# Patient Record
Sex: Male | Born: 1940
Health system: Southern US, Community
[De-identification: ages and names within clinical notes are randomized; demographics above are authoritative.]

## PROBLEM LIST (undated history)

## (undated) DIAGNOSIS — E785 Hyperlipidemia, unspecified: Secondary | ICD-10-CM

## (undated) DIAGNOSIS — I480 Paroxysmal atrial fibrillation: Secondary | ICD-10-CM

## (undated) DIAGNOSIS — I10 Essential (primary) hypertension: Secondary | ICD-10-CM

## (undated) DIAGNOSIS — M109 Gout, unspecified: Secondary | ICD-10-CM

## (undated) HISTORY — DX: Essential (primary) hypertension: I10

## (undated) HISTORY — DX: Hyperlipidemia, unspecified: E78.5

## (undated) HISTORY — PX: OTHER SURGICAL HISTORY: SHX169

---

## 2011-05-16 ENCOUNTER — Ambulatory Visit (INDEPENDENT_AMBULATORY_CARE_PROVIDER_SITE_OTHER): Payer: BC Managed Care – PPO | Admitting: Family Medicine

## 2011-05-16 VITALS — BP 156/74 | HR 60 | Temp 98.3°F | Resp 16 | Ht 61.5 in | Wt 132.6 lb

## 2011-05-16 DIAGNOSIS — N289 Disorder of kidney and ureter, unspecified: Secondary | ICD-10-CM

## 2011-05-16 DIAGNOSIS — IMO0001 Reserved for inherently not codable concepts without codable children: Secondary | ICD-10-CM

## 2011-05-16 DIAGNOSIS — I10 Essential (primary) hypertension: Secondary | ICD-10-CM

## 2011-05-16 LAB — POCT CBC
Granulocyte percent: 60 %G (ref 37–80)
MID (cbc): 0.5 (ref 0–0.9)
MPV: 9 fL (ref 0–99.8)
POC MID %: 7.3 %M (ref 0–12)
Platelet Count, POC: 220 10*3/uL (ref 142–424)
RBC: 5.12 M/uL (ref 4.69–6.13)

## 2011-05-16 LAB — GLUCOSE, POCT (MANUAL RESULT ENTRY): POC Glucose: 75

## 2011-05-16 MED ORDER — ACETAMINOPHEN-CODEINE #3 300-30 MG PO TABS: 1.0000 | ORAL_TABLET | Freq: Two times a day (BID) | ORAL | Status: DC | PRN

## 2011-05-16 MED ORDER — PREDNISONE 20 MG PO TABS: ORAL_TABLET | ORAL | Status: DC

## 2011-05-16 NOTE — Progress Notes (Signed)
  Subjective:    Patient ID: Reginald Long, male    DOB: 01/13/1941, 71 y.o.   MRN: [id number]  Foot Pain This is a new problem. The current episode started yesterday. The problem occurs constantly. The problem has been gradually worsening.  Pain most severe with ambulation  No trauma H/O gout; took Colcrys .6mg  X2 last night and then took 2 additional tablets this morning Minimal relief of symptoms  2) Elevated BP- states never been diagnosed with hypertension in the past. Denies H/A, CP, SOB or focal deficits   Review of Systems     Objective:   Physical Exam  Constitutional: He appears well-developed.  Neck: Neck supple.  Cardiovascular: Normal rate and regular rhythm.   Pulmonary/Chest: Effort normal and breath sounds normal.  Musculoskeletal: He exhibits tenderness (base of the (R) achilles tendon).  Neurological: He is alert.           Assessment & Plan:   1. Elevated BP  POCT glucose (manual entry), Comprehensive metabolic panel  2. Gout  POCT CBC, Uric acid, predniSONE (DELTASONE) 20 MG tablet, acetaminophen-codeine (TYLENOL #3) 300-30 MG per tablet   Monitor BP.  If readings X 3 remain elevated RTC to initiate medication therapy.  Discussed low salt diet and regular exercise.  Interpreter present through entire OV.

## 2011-05-17 LAB — COMPREHENSIVE METABOLIC PANEL
ALT: 54 U/L — ABNORMAL HIGH (ref 0–53)
AST: 60 U/L — ABNORMAL HIGH (ref 0–37)
Alkaline Phosphatase: 89 U/L (ref 39–117)
Sodium: 141 mEq/L (ref 135–145)
Total Bilirubin: 0.5 mg/dL (ref 0.3–1.2)
Total Protein: 7.4 g/dL (ref 6.0–8.3)

## 2011-05-17 LAB — URIC ACID: Uric Acid, Serum: 9.4 mg/dL — ABNORMAL HIGH (ref 4.0–7.8)

## 2011-05-20 ENCOUNTER — Encounter: Payer: Self-pay | Admitting: Family Medicine

## 2011-05-26 ENCOUNTER — Ambulatory Visit (INDEPENDENT_AMBULATORY_CARE_PROVIDER_SITE_OTHER): Payer: BC Managed Care – PPO | Admitting: Family Medicine

## 2011-05-26 VITALS — BP 186/89 | HR 48 | Temp 97.4°F | Resp 16 | Ht 61.25 in | Wt 133.4 lb

## 2011-05-26 DIAGNOSIS — I10 Essential (primary) hypertension: Secondary | ICD-10-CM

## 2011-05-26 DIAGNOSIS — R066 Hiccough: Secondary | ICD-10-CM

## 2011-05-26 MED ORDER — CHLORPROMAZINE HCL 25 MG PO TABS: 25.0000 mg | ORAL_TABLET | Freq: Every day | ORAL | Status: DC

## 2011-05-26 MED ORDER — RANITIDINE HCL 300 MG PO TABS: 300.0000 mg | ORAL_TABLET | Freq: Every day | ORAL | Status: DC

## 2011-05-26 MED ORDER — AMLODIPINE BESYLATE 5 MG PO TABS: 5.0000 mg | ORAL_TABLET | Freq: Every day | ORAL | Status: DC

## 2011-05-26 NOTE — Patient Instructions (Signed)
Hypertension As your heart beats, it forces blood through your arteries. This force is your blood pressure. If the pressure is too high, it is called hypertension (HTN) or high blood pressure. HTN is dangerous because you may have it and not know it. High blood pressure may mean that your heart has to work harder to pump blood. Your arteries may be narrow or stiff. The extra work puts you at risk for heart disease, stroke, and other problems.  Blood pressure consists of two numbers, a higher number over a lower, 110/72, for example. It is stated as "110 over 72." The ideal is below 120 for the top number (systolic) and under 80 for the bottom (diastolic). Write down your blood pressure today. You should pay close attention to your blood pressure if you have certain conditions such as:  Heart failure.   Prior heart attack.   Diabetes   Chronic kidney disease.   Prior stroke.   Multiple risk factors for heart disease.  To see if you have HTN, your blood pressure should be measured while you are seated with your arm held at the level of the heart. It should be measured at least twice. A one-time elevated blood pressure reading (especially in the Emergency Department) does not mean that you need treatment. There may be conditions in which the blood pressure is different between your right and left arms. It is important to see your caregiver soon for a recheck. Most people have essential hypertension which means that there is not a specific cause. This type of high blood pressure may be lowered by changing lifestyle factors such as:  Stress.   Smoking.   Lack of exercise.   Excessive weight.   Drug/tobacco/alcohol use.   Eating less salt.  Most people do not have symptoms from high blood pressure until it has caused damage to the body. Effective treatment can often prevent, delay or reduce that damage. TREATMENT  When a cause has been identified, treatment for high blood pressure is  directed at the cause. There are a large number of medications to treat HTN. These fall into several categories, and your caregiver will help you select the medicines that are best for you. Medications may have side effects. You should review side effects with your caregiver. If your blood pressure stays high after you have made lifestyle changes or started on medicines,   Your medication(s) may need to be changed.   Other problems may need to be addressed.   Be certain you understand your prescriptions, and know how and when to take your medicine.   Be sure to follow up with your caregiver within the time frame advised (usually within two weeks) to have your blood pressure rechecked and to review your medications.   If you are taking more than one medicine to lower your blood pressure, make sure you know how and at what times they should be taken. Taking two medicines at the same time can result in blood pressure that is too low.  SEEK IMMEDIATE MEDICAL CARE IF:  You develop a severe headache, blurred or changing vision, or confusion.   You have unusual weakness or numbness, or a faint feeling.   You have severe chest or abdominal pain, vomiting, or breathing problems.  MAKE SURE YOU:   Understand these instructions.   Will watch your condition.   Will get help right away if you are not doing well or get worse.  Document Released: 01/20/2005 Document Revised: 01/09/2011 Document Reviewed:   09/10/2007 ExitCare Patient Information 2012 [place], [place].Hiccups Hiccups are caused by a sudden contraction of the muscles between the ribs and the muscle under your lungs (diaphragm). When you hiccup, the top of your windpipe (glottis) closes immediately after your diaphragm contracts. This makes the typical 'hic' sound. A hiccup is a reflex that you cannot stop. Unlike other reflexes such as coughing and sneezing, hiccups do not seem to have any useful purpose. There are 3 types of hiccups:    Benign bouts: last less than 48 hours.   Persistent: last more than 48 hours but less than 1 month.   Intractable: last more than 1 month.  CAUSES  Most people have bouts of hiccups from time to time. They start for no apparent reason, last a short while, and then stop. Sometimes they are due to:  A temporary swollen stomach caused by overeating or eating too fast, eating spicy foods, drinking fizzy drinks, or swallowing air.   A sudden change in temperature (very hot or cold foods or drinks, a cold shower).   Drinking alcohol or using tobacco.  There are no particular tests used to diagnose hiccups. Hiccups are usually considered harmless and do not point to a serious medical condition. However, there can be underlying medical problems that may cause hiccups, such as pneumonia, diabetes, metabolic problems, tumors, abdominal infections or injuries, and neurologic problems.You must follow up with your caregiver if your symptoms persist or become a frequent problem. TREATMENT   Most cases need no treatment. A bout of benign hiccups usually does not last long.   Medicine is sometimes needed to stop persistent hiccups. Medicine may be given intravenously (IV) or by mouth.   Hypnosis or acupuncture may be suggested.   Surgery to affect the nerve that supplies the diaphragm may be tried in severe cases.   Treatment of an underlying cause is needed in some cases.  HOME CARE INSTRUCTIONS  Popular remedies that may stop a bout of hiccups include:  Gargling ice water.   Swallowing granulated sugar.   Biting on a lemon.   Holding your breath, breathing fast, or breathing into a paper bag.   Bearing down.   Gasping after a sudden fright.   Pulling your tongue gently.   Distraction.  SEEK MEDICAL CARE IF:   Hiccups last for more than 48 hours.   You are given medicine but your hiccups do not get better.   New symptoms show up.   You cannot sleep or eat due to the hiccups.    You have unexpected weight loss.   You have trouble breathing or swallowing.   You develop severe pain in your abdomen or other areas.   You develop numbness, tingling, or weakness.  Document Released: 03/31/2001 Document Revised: 01/09/2011 Document Reviewed: 03/13/2010 [facility] Patient Information 2012 [place], [place].

## 2011-05-26 NOTE — Progress Notes (Signed)
71 yo [place] man with 10 days of hiccups.  He was seen a week ago and given codeine and prednisone for gout which has now cleared.  He was also noted to have high blood pressure.  No meds were provided for the hiccups or blood pressure.    He has a long h/o high blood pressure readings.  No chest pain but he does have chronic right shoulder pain  He runs a drill press.  O:  NAD, intermittent hiccups. Chest:  Clear Heart:  Reg without murmur Abdomen:  No HSM, mass, or tenderness Shoulders:  FROM bilaterally  A:  Hiccups persisting Elevated bp  P:  Thorazine 25 mg at hs x 7 days ranititidine 300 at hs  Norvasc 5 qd.  Follow up 2 weeks

## 2013-08-16 ENCOUNTER — Ambulatory Visit (INDEPENDENT_AMBULATORY_CARE_PROVIDER_SITE_OTHER): Payer: BC Managed Care – PPO

## 2013-08-16 ENCOUNTER — Ambulatory Visit (INDEPENDENT_AMBULATORY_CARE_PROVIDER_SITE_OTHER): Payer: BC Managed Care – PPO | Admitting: Family Medicine

## 2013-08-16 VITALS — BP 162/84 | HR 50 | Temp 97.8°F | Resp 18 | Ht 61.5 in | Wt 135.0 lb

## 2013-08-16 DIAGNOSIS — S6980XA Other specified injuries of unspecified wrist, hand and finger(s), initial encounter: Secondary | ICD-10-CM

## 2013-08-16 DIAGNOSIS — M79609 Pain in unspecified limb: Secondary | ICD-10-CM

## 2013-08-16 DIAGNOSIS — S6990XA Unspecified injury of unspecified wrist, hand and finger(s), initial encounter: Secondary | ICD-10-CM

## 2013-08-16 DIAGNOSIS — S6991XA Unspecified injury of right wrist, hand and finger(s), initial encounter: Secondary | ICD-10-CM

## 2013-08-16 DIAGNOSIS — M79644 Pain in right finger(s): Secondary | ICD-10-CM

## 2013-08-16 NOTE — Progress Notes (Signed)
   Subjective:    Patient ID: Reginald Long, male    DOB: 05-06-1940, 73 y.o.   MRN: [id number]  HPI   Reginald Long is a very pleasant 73 yr old male here with concern for a RIGHT thumb injury.  He slammed the RIGHT thumb in the care door 6 months ago.  The nail is deformed and he states when he bumps the thumb it bleeds.  He denies pain.  He denies sensation change.  He denies limited ROM.  Did not seek care earlier because the thumb was not bothering him.  BP is elevated - he did not take his medication today   Review of Systems  Constitutional: Negative for fever and chills.  Respiratory: Negative.   Cardiovascular: Negative.   Musculoskeletal: Negative for arthralgias and joint swelling.  Skin: Positive for wound.  Neurological: Negative for weakness and numbness.       Objective:   Physical Exam  Vitals reviewed. Constitutional: He is oriented to person, place, and time. He appears well-developed and well-nourished. No distress.  HENT:  Head: Normocephalic and atraumatic.  Pulmonary/Chest: Effort normal.  Musculoskeletal:       Right hand: He exhibits deformity. He exhibits normal range of motion, no tenderness, normal capillary refill and no swelling. Normal sensation noted. Normal strength noted.       Hands: RIGHT thumb nail partially avulsed with what appears to be significant granulation tissue;  Thumb is non- tender; he has full AROM and full strength; sensation is intact  Neurological: He is alert and oriented to person, place, and time.  Skin: Skin is warm and dry.  Psychiatric: He has a normal mood and affect. His behavior is normal.    [facility] reading (PRIMARY) by  Dr. Carlota Raspberry - possible healed volar plate fracture   Procedure Note: Verbal consent obtained from the patient.  Digital block with 3cc 2% plain lidocaine.  Betadine prep.  Sterile technique employed.  Remaining thumb nail lifted and removed in total - underlying nail bed healthy.  At the ulnar side of the nail bed  there is significant overgrowth of what appears to be granulation tissue - this was debrided sharply.  Hemostasis obtained with silver nitrate and direct pressure.  Xeroform dressing applied.  Pt tolerated very well.     Assessment & Plan:  Thumb pain, right - Plan: DG Finger Thumb Right  Thumb injury, right, initial encounter - Plan: DG Finger Thumb Right, Dermatology pathology    Reginald Long is a very pleasant 73 yr old male after injuring his RIGHT thumb 6 months ago.  Xray reveals possible old fracture, but nothing acute.  Procedure as above.  Specimen sent for pathology to confirm that nail bed growth is in fact granulation tissue.  Discussed wound care.  RTC if concerns    E. Natividad Brood MHS, PA-C [facility] 7/14/20153:19 PM

## 2013-08-16 NOTE — Patient Instructions (Signed)
.   Keep area clean, dry and bandaged for 24 hours. . After 24 hours, remove outer bandage and leave yellow gauze in place. . Soak thumb in warm soapy water for 5-10 minutes, once daily for 5 days. Rebandage thumb after each cleaning. . Continue soaks until yellow gauze falls off. . Notify the office if you experience any of the following signs of infection: Swelling, redness, pus drainage, streaking, fever > 101.0 F

## 2013-08-19 NOTE — Progress Notes (Signed)
Xray read and patient discussed with Reginald Long. Agree with assessment and plan of care per her note. XR report noted: IMPRESSION:  There is mild degenerative changes of the interphalangeal joint and  a possible old fracture through the base of the distal phalanx  ventrally.

## 2013-08-22 ENCOUNTER — Telehealth: Payer: Self-pay

## 2013-08-22 NOTE — Telephone Encounter (Signed)
[facility] called w/path report from biopsy of R thumb bed. Dx of malignant melanoma, stage 4. I have path report at my desk and will put in Dr Vonna Kotyk hands when arrives for appts today. Spoke w/Chelle who felt Dr Carlota Raspberry should be the provider to discuss plan w/pt, also sending this message to Dr Carlota Raspberry for review.

## 2013-08-22 NOTE — Telephone Encounter (Signed)
Attempted call - some difficulty with communication - he stated finger was doing well, but did not understand when I asked him to return to office for eval and to discuss derm report and follow up needed with malignant melanoma on biopsy.   Called [employer]. [place] interpreter called patient, but no answer and unable to leave message. Will attempt phone call again tomorrow.

## 2013-08-23 ENCOUNTER — Ambulatory Visit (INDEPENDENT_AMBULATORY_CARE_PROVIDER_SITE_OTHER): Payer: BC Managed Care – PPO | Admitting: Family Medicine

## 2013-08-23 ENCOUNTER — Ambulatory Visit: Payer: BC Managed Care – PPO

## 2013-08-23 VITALS — BP 184/72 | HR 54 | Temp 98.2°F | Resp 16 | Ht 61.0 in | Wt 134.0 lb

## 2013-08-23 DIAGNOSIS — C4360 Malignant melanoma of unspecified upper limb, including shoulder: Secondary | ICD-10-CM

## 2013-08-23 DIAGNOSIS — C4361 Malignant melanoma of right upper limb, including shoulder: Secondary | ICD-10-CM

## 2013-08-23 DIAGNOSIS — Z789 Other specified health status: Secondary | ICD-10-CM

## 2013-08-23 NOTE — Telephone Encounter (Signed)
Call placed again with use of interpreter.  Spoke with wife -advised to rtc to discuss path results and follow up. understanding expressed and will RTC tonight.

## 2013-08-23 NOTE — Progress Notes (Signed)
Subjective:    Patient ID: Reginald Long, male    DOB: Oct 10, 1940, 73 y.o.   MRN: [id number] This chart was scribed for Reginald Agreste, MD, by Girtha Hake, ED Scribe. The patient's care was started at 7:28 PM.    HPI HPI Comments: Prior history: Patient was seen by Natividad Brood, PA-C on July 14th after an injury to his right thumb approximately six months prior. Nail was deformed and had bleeding from area with bumping it on objects. Appeared to be partially evulsed with suspected granulation tissue. His thumbnail was lifted and removed. But at the ulnar side of the nail bed, suspected granulation tissue overgrowth was debrided. But this tissue was sent for pathology. Pathology report received that did indicate malignant melanoma with maximum tumor thickness at least 2.25 mm. Anatomic level at least IV, but did also have involved margins. See telephone notes. Here for follow up.  Reginald Long is a 73 y.o. male with a history of HTN and hyperlipemia who presents for a follow-up for a right thumb injury. Patient reports that it was initially slammed in a door approximately six months ago. He denies any discoloration of the tissue or pain before this incident. Patient denies any unexplained weight loss, chills, fever, pain, or coughing up blood.  Grandson translated during the appointment.  There are no active problems to display for this patient.  Past Medical History  Diagnosis Date  . Hypertension   . Hyperlipidemia    History reviewed. No pertinent past surgical history.  No Known Allergies  Prior to Admission medications   Medication Sig Start Date End Date Taking? Authorizing Provider  lisinopril (PRINIVIL,ZESTRIL) 20 MG tablet Take 20 mg by mouth daily.   Yes Historical Provider, MD  lovastatin (MEVACOR) 20 MG tablet Take 20 mg by mouth at bedtime.   Yes Historical Provider, MD  naproxen (NAPROSYN) 500 MG tablet Take 500 mg by mouth daily. Takes a half a pill daily   Yes Historical  Provider, MD  ranitidine (ZANTAC) 300 MG tablet Take 1 tablet (300 mg total) by mouth at bedtime. 05/26/11 05/25/12  Robyn Haber, MD   History   Social History  . Marital Status: Married    Spouse Name: N/A    Number of Children: N/A  . Years of Education: N/A   Occupational History  . Not on file.   Social History Main Topics  . Smoking status: Former Research scientist (life sciences)  . Smokeless tobacco: Not on file  . Alcohol Use: Not on file  . Drug Use: Not on file  . Sexual Activity: Not on file   Other Topics Concern  . Not on file   Social History Narrative  . No narrative on file     Review of Systems  Constitutional: Negative for fever, chills and unexpected weight change.  Skin: Positive for wound (right thumb (nailbed)). Negative for rash.   Filed Vitals:   08/23/13 1855  BP: 184/72  Pulse: 54  Temp: 98.2 F (36.8 C)  Resp: 16       Objective:   Physical Exam  Nursing note and vitals reviewed. Constitutional: He is oriented to person, place, and time. He appears well-developed and well-nourished. No distress.  HENT:  Head: Normocephalic and atraumatic.  Eyes: Conjunctivae and EOM are normal.  Neck: Neck supple.  Cardiovascular: Normal rate.   Pulmonary/Chest: Effort normal. No respiratory distress.  Musculoskeletal:  Right thumb: missing the nail. Slight wet appearance to the mid ulnar aspect only with what  appears to be granulation tissue centrally. Hyperpigmented area and darkening towards the base of the nailbed and ulnar-sided nailbed. No surrounding erythema, discharge, or redness. Full ROM in the thumb and at the IP joint. Non-tender.   Right Elbow: Follow ROM. No epitrochlear lymph nodes. No rash on the right arm.  Neurological: He is alert and oriented to person, place, and time.  Skin: Skin is warm and dry.  Psychiatric: He has a normal mood and affect. His behavior is normal.       Assessment & Plan:  [name] is a 73 y.o. male Malignant melanoma of  thumb, right - Plan: Ambulatory referral to Dermatology  - discussed results and need for dermatology follow up to decide next step.  Questions answered, and understanding expressed with use of interpreter.   - refer to dermatology.   Language barrier - grandson translated, understanding expressed.    No orders of the defined types were placed in this encounter.   Patient Instructions  We will call you to schedule the appointment for dermatology. Return to the clinic or go to the nearest emergency room if any of your symptoms worsen or new symptoms occur.     I personally performed the services described in this documentation, which was scribed in my presence. The recorded information has been reviewed and considered, and addended by me as needed.

## 2013-08-23 NOTE — Patient Instructions (Signed)
We will call you to schedule the appointment for dermatology. Return to the clinic or go to the nearest emergency room if any of your symptoms worsen or new symptoms occur.

## 2013-08-25 ENCOUNTER — Telehealth: Payer: Self-pay

## 2013-08-25 NOTE — Telephone Encounter (Signed)
Delores from [facility] called and req'd a copy of Pathology report/thumb melanoma, pt in office being seen now. I could not locate report in Dr Vonna Kotyk box or scan pile so called and req'd another copy from [facility]. Faxed to Delores at [facility] w/confirmation and called her to let her know it was on the way. Advised Dr Carlota Raspberry of status and marked report for scanning.

## 2013-08-26 NOTE — Telephone Encounter (Signed)
Called Reginald Long back at [facility] per Dr Vonna Kotyk request this morning after he received a report from Dr Daine Floras which indicated that they did not have a pathology report and had no plan for f/up. Reginald Long advised she would check w/Dr Szabo/nurse to see if they got the report. Reginald Long called me back to report that they did get the path report but not until after Dr Daine Floras had finished the report. He reviewed the report and advised that pt will need to be referred (by them) to [facility] bc he anticipates that a partial amputation will be indicated which they do not do at their office. Reginald Long questioned whether pt needed to be referred to a surgical group instead, but will check w/[facility] to see if they would be able to perform the amputation if needed and will get pt referred to the appropriate specialist. I asked Reginald Long for an updated report from the provider Dr Daine Floras when a referral has been made so that Dr Carlota Raspberry will know f/up plan. She agreed and has been in touch with the pt's grandson to let him know they are working on an appropriate referral.

## 2013-08-31 NOTE — Telephone Encounter (Signed)
Dr Carlota Raspberry, I saw on cover sheet for addendum report that Delores advised she has sent paperwork for referral to [facility]. I have put the addendum report w/cover sheet highlighted in your box in case you want to update your note. Please let me know if you want me to do any further f/up.

## 2013-09-23 ENCOUNTER — Telehealth: Payer: Self-pay

## 2013-09-23 NOTE — Telephone Encounter (Signed)
Received fax from [facility] to Dr Carlota Raspberry re: pt missing his appt that [place] set up for pt w/ [facility] on 09/14/13. Fax is copy of unable to reach letter they sent to pt after trying to reach him several times. Showed letter to Dr Carlota Raspberry to review and he asked me to try to contact pt or family member also. Called and spoke to pt's wife through [place] interpreter and explained importance of calling [facility] at [phone] to reschedule appt for pt to have his thumb treated. Wife agreed to have their nephew call and set up an appt for pt. Called and spoke to [facility] to update her on status. Mailed pt's xray (that we had left for pt p/up but was never p/up) at [facility] [employer] req to [place].

## 2013-10-26 ENCOUNTER — Encounter: Payer: Self-pay | Admitting: Family Medicine

## 2013-10-26 DIAGNOSIS — C439 Malignant melanoma of skin, unspecified: Secondary | ICD-10-CM | POA: Insufficient documentation

## 2015-12-04 ENCOUNTER — Ambulatory Visit (INDEPENDENT_AMBULATORY_CARE_PROVIDER_SITE_OTHER): Payer: Medicare Other

## 2015-12-04 ENCOUNTER — Ambulatory Visit (INDEPENDENT_AMBULATORY_CARE_PROVIDER_SITE_OTHER): Payer: Medicare Other | Admitting: Student

## 2015-12-04 VITALS — BP 140/78 | HR 56 | Temp 98.7°F | Resp 18 | Ht 61.0 in | Wt 138.0 lb

## 2015-12-04 DIAGNOSIS — M79671 Pain in right foot: Secondary | ICD-10-CM

## 2015-12-04 DIAGNOSIS — I10 Essential (primary) hypertension: Secondary | ICD-10-CM

## 2015-12-04 DIAGNOSIS — M109 Gout, unspecified: Secondary | ICD-10-CM | POA: Diagnosis not present

## 2015-12-04 DIAGNOSIS — E785 Hyperlipidemia, unspecified: Secondary | ICD-10-CM | POA: Diagnosis not present

## 2015-12-04 DIAGNOSIS — A084 Viral intestinal infection, unspecified: Secondary | ICD-10-CM | POA: Diagnosis not present

## 2015-12-04 DIAGNOSIS — Z23 Encounter for immunization: Secondary | ICD-10-CM | POA: Diagnosis not present

## 2015-12-04 LAB — CBC WITH DIFFERENTIAL/PLATELET
Basophils Absolute: 0 cells/uL (ref 0–200)
Basophils Relative: 0 %
EOS PCT: 0 %
Eosinophils Absolute: 0 cells/uL — ABNORMAL LOW (ref 15–500)
HCT: 43.7 % (ref 38.5–50.0)
Hemoglobin: 15.1 g/dL (ref 13.2–17.1)
Lymphocytes Relative: 8 %
Lymphs Abs: 1184 cells/uL (ref 850–3900)
MCH: 30.4 pg (ref 27.0–33.0)
MCHC: 34.6 g/dL (ref 32.0–36.0)
MCV: 88.1 fL (ref 80.0–100.0)
MPV: 9.7 fL (ref 7.5–12.5)
Monocytes Absolute: 740 cells/uL (ref 200–950)
Monocytes Relative: 5 %
Neutro Abs: 12876 cells/uL — ABNORMAL HIGH (ref 1500–7800)
Neutrophils Relative %: 87 %
Platelets: 226 10*3/uL (ref 140–400)
RBC: 4.96 MIL/uL (ref 4.20–5.80)
RDW: 14 % (ref 11.0–15.0)
WBC: 14.8 10*3/uL — AB (ref 3.8–10.8)

## 2015-12-04 LAB — POCT URINALYSIS DIP (MANUAL ENTRY)
BILIRUBIN UA: NEGATIVE
GLUCOSE UA: NEGATIVE
Ketones, POC UA: NEGATIVE
Leukocytes, UA: NEGATIVE
Nitrite, UA: NEGATIVE
Protein Ur, POC: >=300 — AB
SPEC GRAV UA: 1.015
Urobilinogen, UA: 0.2
pH, UA: 6

## 2015-12-04 MED ORDER — NAPROXEN 500 MG PO TABS: 500.0000 mg | ORAL_TABLET | Freq: Two times a day (BID) | ORAL | 0 refills | Status: DC | PRN

## 2015-12-04 NOTE — Assessment & Plan Note (Addendum)
Will get CMP, CBC, and UA and send in medications.  Follow up 6 months.

## 2015-12-04 NOTE — Assessment & Plan Note (Signed)
Likely gouty attack, X-rays did not show injury or OA in foot.  Pain in distal lateral ankle vs tarsal bones of foot.  Will treat with naprosyn and follow up as needed.  Will treat with ppx medication if having frequent flares of gout.

## 2015-12-04 NOTE — Patient Instructions (Addendum)
Rotavirus Infection Rotaviruses are a group of viruses that cause acute stomach and bowel upset (gastroenteritis) in all ages. Rotavirus infection may also be called infantile diarrhea, winter diarrhea, acute nonbacterial infectious gastroenteritis, and acute viral gastroenteritis. It occurs especially in young children. Children 6 months to 74 years of age, premature infants, the elderly, and the immunocompromised are more likely to have severe symptoms.  CAUSES  Rotaviruses are transmitted by the fecal-oral route. This means the virus is spread by eating or drinking food or water that is contaminated with infected stool. The virus is most commonly spread from person to person when someone's hands are contaminated with infected stool. For example, infected food handlers may contaminate foods. This can occur with foods that require handling and no further cooking, such as salads, fruits, and hors d'oeuvres. Rotaviruses are quite stable. They can be hard to control and eliminate in water supplies. Rotaviruses are a common cause of infection and diarrhea in child-care settings. SYMPTOMS  Some children have no symptoms. The period after infection but before symptoms begin (incubation period) ranges from 1 to 3 days. Symptoms usually begin with vomiting. Diarrhea follows for 4 to 8 days. Other symptoms may include:  Low-grade fever.  Temporary dairy (lactose) intolerance.  Cough.  Runny nose. DIAGNOSIS  The disease is diagnosed by identifying the virus in the stool. A person with rotavirus diarrhea often has large numbers of viruses in his or her stool. TREATMENT  There is no cure for rotavirus infection. Most people develop an immune response that eventually gets rid of the virus. While this natural response develops, the virus can make you very ill. The majority of people affected are young infants, so the disease can be dangerous. The most common symptom is diarrhea. Diarrhea alone can cause severe  dehydration. It can also cause an electrolyte imbalance. Treatments are aimed at rehydration. Rehydration treatment can prevent the severe effects of dehydration. Antidiarrheal medicines are not recommended. Such medicines may prolong the infection, since they prevent you from passing the viruses out of your body. Severe diarrhea without fluid and electrolyte replacement may be life threatening. HOME CARE INSTRUCTIONS Ask your health care provider for specific rehydration instructions. SEEK IMMEDIATE MEDICAL CARE IF:   There is decreased urination.  You have a dry mouth, tongue, or lips.  You notice decreased tears or sunken eyes.  You have dry skin.  Your breathing is fast.  Your fingertip takes more than 2 seconds to turn pink again after a gentle squeeze.  There is blood in your vomit or stool.  Your abdomen is enlarged (distended) or very tender.  There is persistent vomiting. Most of this information is courtesy of the Center for Disease Control and Prevention of Food Illness Fact Sheet.   This information is not intended to replace advice given to you by your health care provider. Make sure you discuss any questions you have with your health care provider.   Document Released: 01/20/2005 Document Revised: 02/10/2014 Document Reviewed: 04/18/2010 Elsevier Interactive Patient Education 2016 [employer].    IF you received an x-ray today, you will receive an invoice from [facility]. Please contact [facility] at [phone] with questions or concerns regarding your invoice.   IF you received labwork today, you will receive an invoice from [employer]. Please contact [facility] at [phone] with questions or concerns regarding your invoice.   Our billing staff will not be able to assist you with questions regarding bills from these companies.  You  will be contacted with the lab results as soon as they are available. The fastest  way to get your results is to activate your My Chart account. Instructions are located on the last page of this paperwork. If you have not heard from [place] regarding the results in 2 weeks, please contact this office.

## 2015-12-04 NOTE — Assessment & Plan Note (Addendum)
Likely viral etiology, continue fluid hydration and bland foot diet.  Follow up if not improving in 2-3 days. Will check kidneys

## 2015-12-04 NOTE — Progress Notes (Signed)
Subjective:    Patient ID: Reginald Long, male    DOB: [birth year], 75 y.o.   MRN: [id number]  HPI Complains of right foot pain that has been ongoing for past few days. His friend is present to translate.  His pain and swelling is on the lateral aspect of his foot.  He reports that this is how it presented at his last flare up of gout.  He has had an elevated uric acid level 3 years ago.  He denies any injury to the area.  Abdominal pain since last night.  Has had nausea, vomiting, diarrhea, abdominal pain since last night.  He had 2 episodes of watery and nonbloody diarrhea last night and nonbloody, nonbilious vomiting this morning.  He has been drinking fluids without issue.  Still has abdominal pain.  Denies rectal bleeding.   HTN- has been out of medications for a while now, but denies any symptoms.  No chest pain, shortness of breath, palpitations.      Review of Systems  Constitutional: Negative for chills, fatigue and fever.  HENT: Negative for congestion and sore throat.   Eyes: Negative for photophobia, redness and visual disturbance.  Respiratory: Negative for cough and shortness of breath.   Cardiovascular: Negative for chest pain and leg swelling.  Gastrointestinal: Positive for abdominal pain, diarrhea, nausea and vomiting. Negative for blood in stool and rectal pain.  Genitourinary: Negative for decreased urine volume and hematuria.  Musculoskeletal: Positive for joint swelling. Negative for myalgias.  Skin: Positive for color change. Negative for pallor, rash and wound.  Neurological: Negative for dizziness, syncope, weakness, numbness and headaches.  Psychiatric/Behavioral: Negative for agitation and dysphoric mood.  All other systems reviewed and are negative.      Objective:   Physical Exam  Constitutional: He is oriented to person, place, and time. He appears well-developed and well-nourished. No distress.  HENT:  Head: Normocephalic and atraumatic.  Eyes: Conjunctivae  are normal. No scleral icterus.  Neck: Normal range of motion. Neck supple.  Cardiovascular: Normal rate, regular rhythm, normal heart sounds and intact distal pulses.  Exam reveals no gallop and no friction rub.   No murmur heard. Pulmonary/Chest: Effort normal and breath sounds normal.  Abdominal: Soft. Bowel sounds are normal. He exhibits no distension and no mass. There is tenderness. There is no rebound and no guarding.  Musculoskeletal: He exhibits edema and tenderness. He exhibits no deformity.  TTP along lateral ankle and lateral foot along tarsal bones, ROM painful at ankle, but full.  Has full strength Neurovascularly intact distally  Neurological: He is alert and oriented to person, place, and time.  Skin: Skin is warm. No rash noted. He is not diaphoretic. There is erythema. No pallor.  Erythema along lateral tarsal bones with swelling present  Psychiatric: He has a normal mood and affect. His behavior is normal. Judgment and thought content normal.    BP 140/78 (BP Location: Right Arm, Patient Position: Sitting, Cuff Size: Small)   Pulse (!) 56   Temp 98.7 F (37.1 C) (Oral)   Resp 18   Ht 5\' 1"  (1.549 m)   Wt 138 lb (62.6 kg)   SpO2 98%   BMI 26.07 kg/m        Assessment & Plan:  Essential hypertension Will get CMP, CBC, and UA and send in medications.  Follow up 6 months.    Viral enteritis Likely viral etiology, continue fluid hydration and bland foot diet.  Follow up if not improving in 2-3  days. Will check kidneys  Acute gout of right ankle Likely gouty attack, X-rays did not show injury or OA in foot.  Pain in distal lateral ankle vs tarsal bones of foot.  Will treat with naprosyn and follow up as needed.  Will treat with ppx medication if having frequent flares of gout.  Hyperlipidemia Lipid panel ordered and lovastatin reordered.

## 2015-12-04 NOTE — Assessment & Plan Note (Signed)
Lipid panel ordered and lovastatin reordered.

## 2015-12-05 LAB — COMPREHENSIVE METABOLIC PANEL
ALBUMIN: 4.3 g/dL (ref 3.6–5.1)
ALT: 36 U/L (ref 9–46)
AST: 28 U/L (ref 10–35)
Alkaline Phosphatase: 73 U/L (ref 40–115)
BILIRUBIN TOTAL: 0.8 mg/dL (ref 0.2–1.2)
BUN: 14 mg/dL (ref 7–25)
CHLORIDE: 102 mmol/L (ref 98–110)
CO2: 25 mmol/L (ref 20–31)
CREATININE: 1.24 mg/dL — AB (ref 0.70–1.18)
Calcium: 9.4 mg/dL (ref 8.6–10.3)
Glucose, Bld: 129 mg/dL — ABNORMAL HIGH (ref 65–99)
Potassium: 4.5 mmol/L (ref 3.5–5.3)
SODIUM: 137 mmol/L (ref 135–146)
Total Protein: 7.2 g/dL (ref 6.1–8.1)

## 2015-12-05 LAB — LIPID PANEL
Cholesterol: 266 mg/dL — ABNORMAL HIGH (ref 125–200)
HDL: 75 mg/dL (ref >=40)
LDL CALC: 177 mg/dL — AB (ref <130)
TRIGLYCERIDES: 69 mg/dL (ref <150)
Total CHOL/HDL Ratio: 3.5 Ratio (ref <=5.0)
VLDL: 14 mg/dL (ref <30)

## 2015-12-05 LAB — URIC ACID: Uric Acid, Serum: 7.5 mg/dL (ref 4.0–8.0)

## 2015-12-07 ENCOUNTER — Telehealth: Payer: Self-pay

## 2015-12-07 NOTE — Telephone Encounter (Signed)
Pt's grandson would like to know if there is something Abeer can do about his foot pain without coming in. He is in a lot of pain and they think it's gout. Please advise at [phone]

## 2015-12-10 NOTE — Telephone Encounter (Signed)
Tried to call patient's grandson but VM had not been set up yet.

## 2015-12-11 MED ORDER — COLCHICINE 0.6 MG PO TABS: 0.6000 mg | ORAL_TABLET | Freq: Every day | ORAL | 1 refills | Status: DC

## 2015-12-11 NOTE — Telephone Encounter (Signed)
Called pt's grandson, verified name and DOB.  Notified to stop naprosyn due to kidney injury seen on labs and should come in for recheck in next few weeks.  Sent in colchicine for foot pain.  Take once daily until flare resolved.  His flare has diminished since over the weekend.  Reviewed elevated white count.  Signed,  Balinda Quails, DO [facility] 5:02 PM 12/11/15

## 2016-01-26 ENCOUNTER — Encounter: Payer: Self-pay | Admitting: Emergency Medicine

## 2016-01-26 ENCOUNTER — Emergency Department
Admission: EM | Admit: 2016-01-26 | Discharge: 2016-01-26 | Disposition: A | Discharge status: Home / Self Care | Payer: Medicare Other | Attending: Emergency Medicine | Admitting: Emergency Medicine

## 2016-01-26 DIAGNOSIS — Z79899 Other long term (current) drug therapy: Secondary | ICD-10-CM | POA: Insufficient documentation

## 2016-01-26 DIAGNOSIS — Z87891 Personal history of nicotine dependence: Secondary | ICD-10-CM | POA: Diagnosis not present

## 2016-01-26 DIAGNOSIS — M10072 Idiopathic gout, left ankle and foot: Secondary | ICD-10-CM | POA: Diagnosis not present

## 2016-01-26 DIAGNOSIS — M79675 Pain in left toe(s): Secondary | ICD-10-CM | POA: Diagnosis present

## 2016-01-26 DIAGNOSIS — I10 Essential (primary) hypertension: Secondary | ICD-10-CM | POA: Insufficient documentation

## 2016-01-26 HISTORY — DX: Gout, unspecified: M10.9

## 2016-01-26 MED ORDER — KETOROLAC TROMETHAMINE 60 MG/2ML IM SOLN
30.0000 mg | Freq: Once | INTRAMUSCULAR | Status: AC
  Administered 2016-01-26: 30 mg via INTRAMUSCULAR
  Filled 2016-01-26: qty 2

## 2016-01-26 MED ORDER — COLCHICINE 0.6 MG PO TABS
0.6000 mg | ORAL_TABLET | Freq: Once | ORAL | Status: AC
  Administered 2016-01-26: 0.6 mg via ORAL
  Filled 2016-01-26: qty 1

## 2016-01-26 MED ORDER — TRAMADOL HCL 50 MG PO TABS
50.0000 mg | ORAL_TABLET | Freq: Once | ORAL | Status: AC
  Administered 2016-01-26: 50 mg via ORAL
  Filled 2016-01-26: qty 1

## 2016-01-26 MED ORDER — COLCHICINE 0.6 MG PO TABS
0.6000 mg | ORAL_TABLET | Freq: Every day | ORAL | 2 refills | Status: DC
Start: 2016-01-26 — End: 2016-06-16

## 2016-01-26 MED ORDER — TRAMADOL HCL 50 MG PO TABS: 50.0000 mg | ORAL_TABLET | Freq: Two times a day (BID) | ORAL | 0 refills | Status: DC | PRN

## 2016-01-26 NOTE — ED Notes (Signed)
NAD noted at time of D/C. Pt denies questions or concerns. Pt ambulatory to the lobby at this time. Pt refused wheelchair to the lobby.  

## 2016-01-26 NOTE — ED Provider Notes (Signed)
[facility] Emergency Department Provider Note   ____________________________________________   First MD Initiated Contact with Patient 01/26/16 1147     (approximate)  I have reviewed the triage vital signs and the nursing notes.   HISTORY  Chief Complaint Toe Pain    HPI Reginald Long is a 75 y.o. male patient complain of left great toe pain for 2 days. Patient state has a history of gout but no flareup in the past year. Patient rates his pain as a 10 over 10. Patient described a pain as "achy". No palliative measures for this complaint.   Past Medical History:  Diagnosis Date  . Gout   . Hyperlipidemia   . Hypertension     Patient Active Problem List   Diagnosis Date Noted  . Viral enteritis 12/04/2015  . Hyperlipidemia 12/04/2015  . Essential hypertension 12/04/2015  . Right foot pain 12/04/2015  . Acute gout of right ankle 12/04/2015  . Melanoma of skin, site unspecified 10/26/2013    No past surgical history on file.  Prior to Admission medications   Medication Sig Start Date End Date Taking? Authorizing Provider  colchicine 0.6 MG tablet Take 1 tablet (0.6 mg total) by mouth daily. Until gout flare has resolved [id number]   Alicia B Chitanand, DO  colchicine 0.6 MG tablet Take 1 tablet (0.6 mg total) by mouth daily. 01/26/16 01/25/17  Sable Feil, PA-C  lisinopril (PRINIVIL,ZESTRIL) 20 MG tablet Take 20 mg by mouth daily.    Historical Provider, MD  lovastatin (MEVACOR) 20 MG tablet Take 20 mg by mouth at bedtime.    Historical Provider, MD  traMADol (ULTRAM) 50 MG tablet Take 1 tablet (50 mg total) by mouth every 12 (twelve) hours as needed. 01/26/16   Sable Feil, PA-C    Allergies Patient has no known allergies.  No family history on file.  Social History Social History  Substance Use Topics  . Smoking status: Former Research scientist (life sciences)  . Smokeless tobacco: Never Used  . Alcohol use No    Review of Systems Constitutional: No  fever/chills Eyes: No visual changes. ENT: No sore throat. Cardiovascular: Denies chest pain. Respiratory: Denies shortness of breath. Gastrointestinal: No abdominal pain.  No nausea, no vomiting.  No diarrhea.  No constipation. Genitourinary: Negative for dysuria. Musculoskeletal: Negative for back pain. Skin: Negative for rash. Neurological: Negative for headaches, focal weakness or numbness. Endocrine:Hypertension, hyperlipidemia, and gout.  ____________________________________________   PHYSICAL EXAM:  VITAL SIGNS: ED Triage Vitals  Enc Vitals Group     BP 01/26/16 1134 (!) 175/77     Pulse Rate 01/26/16 1134 75     Resp 01/26/16 1134 18     Temp 01/26/16 1134 97.5 F (36.4 C)     Temp Source 01/26/16 1134 Oral     SpO2 01/26/16 1134 97 %     Weight 01/26/16 1135 133 lb (60.3 kg)     Height --      Head Circumference --      Peak Flow --      Pain Score 01/26/16 1135 10     Pain Loc --      Pain Edu? --      Excl. in [place]? --     Constitutional: Alert and oriented. Well appearing and in no acute distress. Eyes: Conjunctivae are normal. PERRL. EOMI. Head: Atraumatic. Nose: No congestion/rhinnorhea. Mouth/Throat: Mucous membranes are moist.  Oropharynx non-erythematous. Neck: No stridor.  No cervical spine tenderness to palpation. Hematological/Lymphatic/Immunilogical: No cervical  lymphadenopathy. Cardiovascular: Normal rate, regular rhythm. Grossly normal heart sounds.  Good peripheral circulation. Respiratory: Normal respiratory effort.  No retractions. Lungs CTAB. Gastrointestinal: Soft and nontender. No distention. No abdominal bruits. No CVA tenderness. Musculoskeletal: No obvious deformity to the left hallux. Moderate edema and erythema. Moderate guarding palpation.  Neurologic:  Normal speech and language. No gross focal neurologic deficits are appreciated. No gait instability. Skin:  Skin is warm, dry and intact. No rash noted. Psychiatric: Mood and affect  are normal. Speech and behavior are normal.  ____________________________________________   LABS (all labs ordered are listed, but only abnormal results are displayed)  Labs Reviewed - No data to display ____________________________________________  EKG   ____________________________________________  RADIOLOGY   ____________________________________________   PROCEDURES  Procedure(s) performed: None  Procedures  Critical Care performed: No  ____________________________________________   INITIAL IMPRESSION / ASSESSMENT AND PLAN / ED COURSE  Pertinent labs & imaging results that were available during my care of the patient were reviewed by me and considered in my medical decision making (see chart for details). '0  Clinical Course    Patient report for gout flareup. Patient was given Toradol, colchicine, and tramadol. Patient states since 1235 hrs. decreased pain to the left great toe.  ____________________________________________   FINAL CLINICAL IMPRESSION(S) / ED DIAGNOSES  Final diagnoses:  Acute idiopathic gout involving toe of left foot      NEW MEDICATIONS STARTED DURING THIS VISIT:  New Prescriptions   COLCHICINE 0.6 MG TABLET    Take 1 tablet (0.6 mg total) by mouth daily.   TRAMADOL (ULTRAM) 50 MG TABLET    Take 1 tablet (50 mg total) by mouth every 12 (twelve) hours as needed.     Note:  This document was prepared using Dragon voice recognition software and may include unintentional dictation errors.    Sable Feil, PA-C 01/26/16 1245    Lisa Roca, MD 01/26/16 [phone]

## 2016-01-26 NOTE — ED Triage Notes (Signed)
L toe pain x 3 days, history of gout. [place] speaking.

## 2016-02-06 DIAGNOSIS — R066 Hiccough: Secondary | ICD-10-CM | POA: Diagnosis not present

## 2016-03-18 ENCOUNTER — Ambulatory Visit (INDEPENDENT_AMBULATORY_CARE_PROVIDER_SITE_OTHER): Payer: Medicare HMO

## 2016-03-18 ENCOUNTER — Ambulatory Visit (INDEPENDENT_AMBULATORY_CARE_PROVIDER_SITE_OTHER): Payer: Medicare HMO | Admitting: Physician Assistant

## 2016-03-18 VITALS — BP 144/80 | HR 64 | Temp 98.8°F | Resp 18 | Ht 61.0 in | Wt 133.2 lb

## 2016-03-18 DIAGNOSIS — M25562 Pain in left knee: Secondary | ICD-10-CM | POA: Diagnosis not present

## 2016-03-18 DIAGNOSIS — M1712 Unilateral primary osteoarthritis, left knee: Secondary | ICD-10-CM | POA: Diagnosis not present

## 2016-03-18 DIAGNOSIS — I10 Essential (primary) hypertension: Secondary | ICD-10-CM | POA: Diagnosis not present

## 2016-03-18 DIAGNOSIS — M25561 Pain in right knee: Secondary | ICD-10-CM

## 2016-03-18 MED ORDER — ACETAMINOPHEN 325 MG PO TABS: 650.0000 mg | ORAL_TABLET | Freq: Four times a day (QID) | ORAL | 1 refills | Status: DC | PRN

## 2016-03-18 MED ORDER — DICLOFENAC SODIUM 1 % TD GEL: 4.0000 g | Freq: Four times a day (QID) | TRANSDERMAL | 2 refills | Status: DC

## 2016-03-18 MED ORDER — LISINOPRIL 20 MG PO TABS: 20.0000 mg | ORAL_TABLET | Freq: Every day | ORAL | 1 refills | Status: DC

## 2016-03-18 NOTE — Patient Instructions (Addendum)
Take 650-1000mg  tylenol every 6 hours  daily for knee pain. Max dose is 4000mg  daily. Use topical gel to affected area up to four times a day. If the pain persists or becomes too bothersome over the next few weeks, return to clinic as you may need a referral to ortho at this time.   Thank you for letting me participate in your health and well being.   IF you received an x-ray today, you will receive an invoice from [facility]. Please contact [facility] at [phone] with questions or concerns regarding your invoice.   IF you received labwork today, you will receive an invoice from [facility]. Please contact [facility] at [phone] with questions or concerns regarding your invoice.   Our billing staff will not be able to assist you with questions regarding bills from these companies.  You will be contacted with the lab results as soon as they are available. The fastest way to get your results is to activate your My Chart account. Instructions are located on the last page of this paperwork. If you have not heard from [place] regarding the results in 2 weeks, please contact this office.    osVim x??ng kh?p (Osteoarthritis) Vim x??ng kh?p l m?t b?nh gy ?au v vim kh?p. B?nh x?y ra khi s?n ? ch? kh?p vim b? mn. S?n c tc d?ng nh? m?t t?m n?m, b?c cc ??u x??ng, n?i chng g?p nhau ?? t?o thnh kh?p. Vim x??ng kh?p l d?ng ph? bi?n nh?t c?a vim kh?p. B?nh th??ng g?p ? ng??i cao tu?i. Cc kh?p th??ng b? ?nh h??ng b?i tnh tr?ng ny nh?t ?:  Cc ??u ngn tay.  Ngn tay ci.  C?.  Th?t l?ng.  ??u g?i.  Hng. NGUYN NHN Theo th?i gian, ph?n s?n b?c cc ??u x??ng b?t ??u mn d?n. ?i?u ny lm cho x??ng c? vo x??ng, gy ?au v c?ng ? cc kh?p b? ?nh h??ng. CC Y?U T? NGUY C? M?t s? y?u t? nh?t ??nh c th? lm t?ng kh? n?ng b? vim x??ng kh?p, bao g?m:  Tu?i cao.  Tr?ng l??ng c? th? th?a.  S? d?ng cc kh?p qu m?c.  Tr??c ?y b? th??ng ? kh?p. D?U HI?U V TRI?U  CH?NG  ?au, s?ng v c?ng kh?p.  Theo th?i gian, kh?p c th? b? m?t hnh d?ng bnh th??ng.  M?t l??ng x??ng nh? tch t? (ch?i x??ng) c th? pht tri?n trn cc b? kh?p.  Cc m?nh x??ng ho?c s?n c th? v? ra v tri n?i trong khe kh?p. ?i?u ny c th? gy ?au v t?n th??ng. CH?N ?ON Chuyn gia ch?m [place] s?c kh?e c?a qu v? s? khm th?c th? v h?i v? nh?ng tri?u ch?ng c?a qu v?. C th? c?n lm cc ki?m tra nh?:  Ch?p X quang kh?p b? ?nh h??ng.  Xt nghi?m mu ?? lo?i tr? cc lo?i vim kh?p khc. Cc ki?m tra b? sung c th? c?n th?c hi?n ?? ch?n ?on tnh tr?ng b?nh. ?I?U TR? M?c tiu c?a vi?c ?i?u tr? l ki?m sot c?n ?au v c?i thi?n ch?c n?ng kh?p Cc k? ho?ch ?i?u tr? c th? bao g?m:  M?t ch??ng trnh t?p th? d?c ???c ch? ??nh cho php ngh? ng?i v gi?m ?au kh?p.  M?t k? ho?ch ki?m sot cn n?ng.  Cc k? thu?t gi?m ?au nh?:  Ch??m nng v l?nh ?ng cch.  Xung ?i?n ???c chuy?n ??n cc ??u dy th?n kinh d??i da (kch thch h? th?n kinh b?ng ?i?n qua  da [TENS]).  Xoa bp.  M?t s? th?c ph?m ch?c n?ng nh?t ??nh.  Thu?c ?? ki?m sot c?n ?au, ch?ng h?n nh?:  Acetaminophen.  Thu?c ch?ng vim khng c steroid (NSAID), ch?ng h?n nh? naproxen.  Thu?c gi?m ?au gy ng? ho?c thu?c c tc d?ng trn h? th?n kinh trung ??ng, ch?ng h?n nh? tramadol.  Corticosteroid. Thu?c ny c th? dng ?? u?ng ho?c tim.  Ph?u thu?t ??t l?i v? tr cc x??ng v gi?m ?au (th? thu?t c?t x??ng) ho?c l?y nh?ng m?nh x??ng v s?n r?i ra. C th? c?n thay kh?p ? giai ?o?n cu?i c?a vim x??ng kh?p. H??NG D?N CH?M [place] T?I NH  Ch? s? d?ng thu?c theo ch? d?n c?a chuyn gia ch?m [place] s?c kh?e.  Duy tr cn n?ng c l?i cho s?c kh?e. Tun th? theo ch? ??n c?a chuyn gia ch?m [place] s?c kh?e ?? ki?m sot cn n?ng. Vi?c ny c th? bao g?m nh?ng h??ng d?n v? ch? ?? ?n.  T?p th? d?c theo ch? d?n. Chuyn gia ch?m [place] s?c kh?e c th? gi?i thi?u cc lo?i bi t?p c? th?. Nh?ng thay ??i ny c th? bao g?m:  T?p t?ng c??ng  s?c m?nh. Cc bi t?p ny ???c th?c hi?n ?? t?ng c??ng c? b?p h? tr? cc kh?p b? ?nh h??ng b?i vim kh?p. Cc bi t?p ny c th? ???c th?c hi?n v?i cc qu? t? ho?c cc d?i b?ng t?p ?? thm s?c ch?u ??ng.  Ho?t ??ng aerobic. ?y l nh?ng bi t?p, ch?ng h?n nh? ?i b? nhanh ho?c th? d?c nh?p ?i?u tc ??ng th?p, lm cho tim qu v? ??p m?nh.  Cc ho?t ??ng gip cho kh?p m?m d?o. Nh?ng ho?t ??ng ny gi? cho cc kh?p x??ng m?m de?o.  Cc bi t?p cn b?ng v nhanh nh?n. Cc bi t?p ny gip qu v? duy tr cc k? n?ng s?ng hng ngy.  ?? cho kh?p b? ?nh h??ng ngh? ng?i theo ch? d?n c?a chuyn gia ch?m [place] s?c kh?e.  Tun th? m?i cu?c h?n khm l?i theo ch? d?n c?a chuyn gia ch?m [place] s?c kh?e. ?I KHM N?U:  Da c?a qu v? chuy?n sang mu ??.  Ngoi ?au kh?p, qu v? cn b? pht ban.  Qu v? b? ?au kh?p tr?m tr?ng h?n.  Qu v? b? s?t km v?i ?au kh?p ho?c ?au c?. NGAY L?P T?C ?I KHM N?U:  Qu v? b? st cn ?ng k? ho?c m?t c?m gic ngon mi?ng.  Qu v? ?? m? hi ban ?m. ?? BI?T THM [place] of Arthritis and Musculoskeletal and Skin Diseases (Vi?n Vim kh?p v C? x??ng v B?nh da Qu?c gia): www.niams.SouthExposed.es  [employer] on Aging (Vi?n Lo khoa Qu?c gia): http://kim-miller.com/  American College of Rheumatology (Tr??ng ?o t?o v? Th?p kh?p M?): www.rheumatology.org Thng tin ny khng nh?m m?c ?ch thay th? cho l?i khuyn m chuyn gia ch?m [place] s?c kh?e ni v?i qu v?. Hy b?o ??m qu v? ph?i th?o lu?n b?t k? v?n ?? g m qu v? c v?i chuyn gia ch?m [place] s?c kh?e c?a qu v?. Document Released: 01/20/2005 Document Revised: 02/10/2014 Document Reviewed: 09/27/2012 Elsevier Interactive Patient Education  2017 [employer].

## 2016-03-18 NOTE — Progress Notes (Signed)
Reginald Long  MRN: [id number] DOB: 1940-05-26  Subjective:  Reginald Long is a 76 y.o. male seen in office today for a chief complaint of bilateral knee pain x 1 week. States his knees feel very stiff in the morning. Pain is worsened with walking and bending. Pain is relieved when sitting. Denies acute injury, swelling, redness, warmth, numbness, and tingling. Pt has been a sedentary individual for the past month as he has been laid off for the past month.  Of note, pt has history of gout in his feet but notes this pain is not similar at all. He has not tried anything for his current knee pain.   Also needs refill for bp medication as he ran out today. Currently managed with lisinopril 62m daily. Patient is not checking blood pressure at home. Denies lightheadedness, dizziness, chronic headache, double vision, chest pain, shortness of breath, heart racing, palpitations, nausea, vomiting, abdominal pain, hematuria, lower leg swelling. Denies smoking.  Denies any other aggravating or relieving factors, no other questions or concerns.   Review of Systems  Constitutional: Negative for chills, diaphoresis, fatigue and fever.  Musculoskeletal: Positive for back pain. Negative for neck pain.  Neurological: Negative for weakness.    Patient Active Problem List   Diagnosis Date Noted  . Viral enteritis 12/04/2015  . Hyperlipidemia 12/04/2015  . Essential hypertension 12/04/2015  . Right foot pain 12/04/2015  . Acute gout of right ankle 12/04/2015  . Melanoma of skin, site unspecified 10/26/2013    Current Outpatient Prescriptions on File Prior to Visit  Medication Sig Dispense Refill  . colchicine 0.6 MG tablet Take 1 tablet (0.6 mg total) by mouth daily. 15 tablet 2  . lovastatin (MEVACOR) 20 MG tablet Take 20 mg by mouth at bedtime.    . traMADol (ULTRAM) 50 MG tablet Take 1 tablet (50 mg total) by mouth every 12 (twelve) hours as needed. 12 tablet 0  . colchicine 0.6 MG tablet Take 1 tablet (0.6 mg  total) by mouth daily. Until gout flare has resolved (Patient not taking: Reported on 03/18/2016) 20 tablet 1   No current facility-administered medications on file prior to visit.     No Known Allergies   Objective:  BP (!) 144/80 (BP Location: Right Arm, Patient Position: Sitting, Cuff Size: Small)   Pulse 64   Temp 98.8 F (37.1 C) (Oral)   Resp 18   Ht _0  (1.549 m)   Wt 133 lb 3.2 oz (60.4 kg)   SpO2 98%   BMI 25.17 kg/m   Physical Exam  Constitutional: He is oriented to person, place, and time and well-developed, well-nourished, and in no distress.  HENT:  Head: Normocephalic and atraumatic.  Eyes: Conjunctivae are normal.  Neck: Normal range of motion.  Cardiovascular: Normal rate, regular rhythm and normal heart sounds.   Pulmonary/Chest: Effort normal and breath sounds normal.  Musculoskeletal:       Right knee: He exhibits deformity (mild bony prominence noted on medial aspect of knee). He exhibits normal range of motion, no swelling, no effusion, no ecchymosis, no erythema, no LCL laxity, normal patellar mobility, no bony tenderness, normal meniscus and no MCL laxity. Tenderness found. Patellar tendon (mild) tenderness noted. No medial joint line, no lateral joint line, no MCL and no LCL tenderness noted.       Left knee: He exhibits deformity (moderate bony prominence noted on medial aspect of knee). He exhibits normal range of motion, no swelling, no effusion, no erythema, no LCL  laxity, normal patellar mobility, normal meniscus and no MCL laxity. Tenderness found. Medial joint line tenderness noted. No lateral joint line, no MCL, no LCL and no patellar tendon tenderness noted.       Right ankle: Normal.       Left ankle: Normal.  Neurological: He is alert and oriented to person, place, and time. He has normal sensation and normal strength. Gait normal.  Reflex Scores:      Patellar reflexes are 2+ on the right side and 2+ on the left side.      Achilles reflexes are  2+ on the right side and 2+ on the left side. Skin: Skin is warm and dry.  Psychiatric: Affect normal.  Vitals reviewed.  No results found for this or any previous visit (from the past 24 hour(s)).  Dg Knee Complete 4 Views Left  Result Date: 03/18/2016 CLINICAL DATA:  Injury to left knee EXAM: LEFT KNEE - COMPLETE 4+ VIEW COMPARISON:.: COMPARISON:. None available FINDINGS: Early marginal spurs about the lateral compartment and from the patellar articular surface. Negative for fracture or dislocation. No definite effusion. Soft tissues are unremarkable. IMPRESSION: 1. Negative for fracture or other acute finding 2. Early lateral compartment and patellar degenerative spurring. Electronically Signed   By: Lucrezia Europe M.D.   On: 03/18/2016 08:54    Assessment and Plan :  1. Acute pain of both knees -Labs pending, physical exam reassuring. Unlikely a gout flare due to bilateral presentation, no erythema, warmth, or swelling noted. Presentation and plain film of right knee consistent with OA, will treat conservatively with tylenol and topical NSAID. Pt instructed to follow up in a couple of weeks if no improvement with medications. Return sooner or go to ED if symptoms worsen.  - DG Knee Complete 4 Views Left; Future - CMP14+EGFR - CBC with Differential/Platelet - acetaminophen (TYLENOL) 325 MG tablet; Take 2 tablets (650 mg total) by mouth every 6 (six) hours as needed.  Dispense: 180 tablet; Refill: 1 - diclofenac sodium (VOLTAREN) 1 % GEL; Apply 4 g topically 4 (four) times daily.  Dispense: 100 g; Refill: 2 - Uric acid  2. Essential hypertension -Mildly uncontrolled in office today although patient is out of medication. Pt informed to start taking medication daily and return to clinic in 3 months for reevaluation. - lisinopril (PRINIVIL,ZESTRIL) 20 MG tablet; Take 1 tablet (20 mg total) by mouth daily.  Dispense: 90 tablet; Refill: [facility] PA-C  [facility] 03/18/2016 9:14 AM

## 2016-03-19 LAB — CBC WITH DIFFERENTIAL/PLATELET
BASOS: 0 %
Basophils Absolute: 0 10*3/uL (ref 0.0–0.2)
EOS (ABSOLUTE): 0.4 10*3/uL (ref 0.0–0.4)
EOS: 5 %
HEMATOCRIT: 43.8 % (ref 37.5–51.0)
Hemoglobin: 15 g/dL (ref 13.0–17.7)
IMMATURE GRANS (ABS): 0 10*3/uL (ref 0.0–0.1)
Immature Granulocytes: 0 %
Lymphocytes Absolute: 1.6 10*3/uL (ref 0.7–3.1)
Lymphs: 18 %
MCH: 30.1 pg (ref 26.6–33.0)
MCHC: 34.2 g/dL (ref 31.5–35.7)
MCV: 88 fL (ref 79–97)
MONOS ABS: 0.4 10*3/uL (ref 0.1–0.9)
Monocytes: 5 %
NEUTROS PCT: 72 %
Neutrophils Absolute: 6.4 10*3/uL (ref 1.4–7.0)
Platelets: 209 10*3/uL (ref 150–379)
RBC: 4.99 x10E6/uL (ref 4.14–5.80)
RDW: 14.8 % (ref 12.3–15.4)
WBC: 8.9 10*3/uL (ref 3.4–10.8)

## 2016-03-19 LAB — CMP14+EGFR

## 2016-03-19 LAB — URIC ACID

## 2016-03-20 DIAGNOSIS — M25561 Pain in right knee: Secondary | ICD-10-CM | POA: Diagnosis not present

## 2016-03-20 DIAGNOSIS — M25562 Pain in left knee: Secondary | ICD-10-CM | POA: Diagnosis not present

## 2016-03-20 NOTE — Addendum Note (Signed)
Addended by: Gari Crown D on: 03/20/2016 10:46 AM   Modules accepted: Orders

## 2016-03-21 LAB — CMP14+EGFR
A/G RATIO: 1.4 (ref 1.2–2.2)
ALBUMIN: 4.3 g/dL (ref 3.5–4.8)
ALK PHOS: 95 IU/L (ref 39–117)
ALT: 28 IU/L (ref 0–44)
AST: 19 IU/L (ref 0–40)
BUN / CREAT RATIO: 14 (ref 10–24)
BUN: 17 mg/dL (ref 8–27)
Bilirubin Total: 0.5 mg/dL (ref 0.0–1.2)
CO2: 22 mmol/L (ref 18–29)
Calcium: 9.7 mg/dL (ref 8.6–10.2)
Chloride: 100 mmol/L (ref 96–106)
Creatinine, Ser: 1.19 mg/dL (ref 0.76–1.27)
GFR calc Af Amer: 69 mL/min/{1.73_m2} (ref >59)
GFR calc non Af Amer: 60 mL/min/{1.73_m2} (ref >59)
Globulin, Total: 3 g/dL (ref 1.5–4.5)
Glucose: 99 mg/dL (ref 65–99)
POTASSIUM: 5.6 mmol/L — AB (ref 3.5–5.2)
SODIUM: 141 mmol/L (ref 134–144)
Total Protein: 7.3 g/dL (ref 6.0–8.5)

## 2016-03-21 LAB — PLEASE NOTE

## 2016-03-26 ENCOUNTER — Telehealth: Payer: Self-pay | Admitting: Physician Assistant

## 2016-03-26 NOTE — Telephone Encounter (Signed)
Called patient to ask him to come in to repeat his potassium. No answer and his voicemail has not been set up.

## 2016-06-13 ENCOUNTER — Ambulatory Visit: Payer: Medicare HMO | Admitting: Physician Assistant

## 2016-06-16 ENCOUNTER — Ambulatory Visit (INDEPENDENT_AMBULATORY_CARE_PROVIDER_SITE_OTHER): Payer: Medicare HMO | Admitting: Physician Assistant

## 2016-06-16 ENCOUNTER — Encounter: Payer: Self-pay | Admitting: Physician Assistant

## 2016-06-16 VITALS — BP 149/75 | HR 53 | Temp 98.0°F | Resp 17 | Ht 61.0 in | Wt 137.0 lb

## 2016-06-16 DIAGNOSIS — M25562 Pain in left knee: Secondary | ICD-10-CM

## 2016-06-16 DIAGNOSIS — I10 Essential (primary) hypertension: Secondary | ICD-10-CM

## 2016-06-16 DIAGNOSIS — G8929 Other chronic pain: Secondary | ICD-10-CM

## 2016-06-16 DIAGNOSIS — Z8739 Personal history of other diseases of the musculoskeletal system and connective tissue: Secondary | ICD-10-CM | POA: Diagnosis not present

## 2016-06-16 MED ORDER — LISINOPRIL 20 MG PO TABS: 20.0000 mg | ORAL_TABLET | Freq: Every day | ORAL | 1 refills | Status: DC

## 2016-06-16 MED ORDER — AMLODIPINE BESYLATE 2.5 MG PO TABS: 2.5000 mg | ORAL_TABLET | Freq: Every day | ORAL | 0 refills | Status: DC

## 2016-06-16 MED ORDER — COLCHICINE 0.6 MG PO TABS: ORAL_TABLET | ORAL | 2 refills | Status: DC

## 2016-06-16 NOTE — Progress Notes (Signed)
MRN: [id number] DOB: 1940-08-24  Subjective:   Reginald Long is a 76 y.o. male presenting for follow up knee pain and med refill for HTN and gout medication.   Left knee pain: Onset was gradual, starting about 3 months ago. Inciting event: none known. Current symptoms include: giving out and uncomfortable sensation. Pain is aggravated by going up and down stairs and inactivity. Patient has had prior knee problems. Evaluation to date: 03/18/16 plain films: early lateral compartment and patellar degenerative spurring. Treatment to date: topical prescription NSAIDS which are effective but still experiencing the pain occasionally. Has never worn a brace. He is not icing often. Denies redness, swelling, warmth, weakness, numbness, and tingling.   HTN: Currently managed with lisinopril 12m daily. Patient is not checking blood pressure at home. Reports no side effects. Denies lightheadedness, dizziness, chronic headache, double vision, chest pain, shortness of breath, heart racing, palpitations, nausea, vomiting, abdominal pain, hematuria, lower leg swelling. Denies smoking   Gout: Has had a dx for years. Takes colchicine intermittently for gout. Would like a refill to have in the future.   PThaleshas a current medication list which includes the following prescription(s): colchicine, diclofenac sodium, lisinopril, amlodipine, and lovastatin. Also has No Known Allergies.  PAmori has a past medical history of Gout; Hyperlipidemia; and Hypertension. Also  has no past surgical history on file.   Objective:   Vitals: BP (!) 149/75 (BP Location: Left Arm, Patient Position: Sitting, Cuff Size: Normal)   Pulse (!) 53   Temp 98 F (36.7 C) (Oral)   Resp 17   Ht _0  (1.549 m)   Wt 137 lb (62.1 kg)   SpO2 97%   BMI 25.89 kg/m   Physical Exam  Constitutional: He is oriented to person, place, and time. He appears well-developed and well-nourished.  HENT:  Head: Normocephalic and atraumatic.  Eyes:  Conjunctivae are normal.  Neck: Normal range of motion.  Cardiovascular: Normal rate, regular rhythm, normal heart sounds and intact distal pulses.   Pulmonary/Chest: Effort normal and breath sounds normal.  Musculoskeletal:       Left knee: He exhibits normal range of motion, no swelling, no ecchymosis, no erythema and normal patellar mobility. Tenderness found. Lateral joint line and patellar tendon tenderness noted.  Neurological: He is alert and oriented to person, place, and time.  Skin: Skin is warm and dry.  Psychiatric: He has a normal mood and affect.  Vitals reviewed.  No results found for this or any previous visit (from the past 24 hour(s)).  Assessment and Plan :  1. Chronic pain of left knee -Consistent with OA. Doing well with topical gel. Instructed to continue this daily, increase to applying twice a day. Use tylenol at night time for pain for the next week. Ice area nightly. Knee brace applied in office. Given educational material on OA. Return if symptoms pain worsens or does not improve. Can consider steroid injection for temporary relief or referral to ortho if pain continues, pt declines today.   - Apply other splint  2. Essential hypertension Uncontrolled in office. Will add a second agent at this time. Instructed to return for reevaluation by me in 6 weeks. Check bp outside of office, goal is <140/90 and >100/60.  - lisinopril (PRINIVIL,ZESTRIL) 20 MG tablet; Take 1 tablet (20 mg total) by mouth daily.  Dispense: 90 tablet; Refill: 1 - amLODipine (NORVASC) 2.5 MG tablet; Take 1 tablet (2.5 mg total) by mouth daily.  Dispense: 90 tablet; Refill:  0 - CMP14+EGFR  3. History of gout Given refills for future gout flares. Educated on how to dose medication during a flare.  - colchicine 0.6 MG tablet; Take two tablets at onset of flare up then 0.53m one hour later.  Dispense: 15 tablet; Refill: [place] PA-C  [facility]5/14/2018 7:39 PM

## 2016-06-16 NOTE — Patient Instructions (Addendum)
For knee pain, I would like you to take tylenol nightly for the next week. Use voltaren gel twice a day instead of just once. Wear the brace daily. You can also apply ice to the affected area at night before bed for 20 minutes at a time. Below is some information about osteoarthritis. Please let me know if the pain worsens.    Follow up in 6 weeks for repeat blood pressure. You can make the appointment when you leave today. Start taking both amolodipine 2.5mg  and lisinopril 20mg  daily for hypertension. Check bp outside of office at least a couple of times. Goal is <140/90 and >100/60.   For gout, use colchicine as needed. The recommended dosing is on your prescription bottle.    Arthritis Arthritis means joint pain. It can also mean joint disease. A joint is a place where bones come together. People who have arthritis may have:  Red joints.  Swollen joints.  Stiff joints.  Warm joints.  A fever.  A feeling of being sick. Follow these instructions at home: Pay attention to any changes in your symptoms. Take these actions to help with your pain and swelling. Medicines   Take over-the-counter and prescription medicines only as told by your doctor.  Do not take aspirin for pain if your doctor says that you may have gout. Activity   Rest your joint if your doctor tells you to.  Avoid activities that make the pain worse.  Exercise your joint regularly as told by your doctor. Try doing exercises like:  Swimming.  Water aerobics.  Biking.  Walking. Joint Care    If your joint is swollen, keep it raised (elevated) if told by your doctor.  If your joint feels stiff in the morning, try taking a warm shower.  If you have diabetes, do not apply heat without asking your doctor.  If told, apply heat to the joint:  Put a towel between the joint and the hot pack or heating pad.  Leave the heat on the area for 20-30 minutes.  If told, apply ice to the joint:  Put ice in a  plastic bag.  Place a towel between your skin and the bag.  Leave the ice on for 20 minutes, 2-3 times per day.  Keep all follow-up visits as told by your doctor. Contact a doctor if:  The pain gets worse.  You have a fever. Get help right away if:  You have very bad pain in your joint.  You have swelling in your joint.  Your joint is red.  Many joints become painful and swollen.  You have very bad back pain.  Your leg is very weak.  You cannot control your pee (urine) or poop (stool). This information is not intended to replace advice given to you by your health care provider. Make sure you discuss any questions you have with your health care provider. Document Released: 04/16/2009 Document Revised: 06/28/2015 Document Reviewed: 04/17/2014 Elsevier Interactive Patient Education  2017 [employer].      IF you received an x-ray today, you will receive an invoice from [facility]. Please contact [facility] at [phone] with questions or concerns regarding your invoice.   IF you received labwork today, you will receive an invoice from [facility]. Please contact [facility] at [phone] with questions or concerns regarding your invoice.   Our billing staff will not be able to assist you with questions regarding bills from these companies.  You will be contacted with the lab results  as soon as they are available. The fastest way to get your results is to activate your My Chart account. Instructions are located on the last page of this paperwork. If you have not heard from [place] regarding the results in 2 weeks, please contact this office.

## 2016-06-16 NOTE — Progress Notes (Deleted)
Subjective:    Reginald Long is a 76 y.o. male who presents with knee pain involving both knees. Knee pain is worse on the left side. Onset was gradual, starting about 3 months ago. Inciting event: none known. Current symptoms include: giving out and uncomfortable sensation. Pain is aggravated by going up and down stairs and inactivity. Patient has had prior knee problems. Evaluation to date: {knee pain eval:14090}. Seen by me three months ago. Treatment to date: prescription NSAIDS which are somewhat effective. Has never worn a brace. No redness so swelling  Needs refill for lisinopril and colchine. Not checking bp outside the office. Has never been on a second agent. Last gout flare was   Notes the topical medication works very well but   {Common ambulatory SmartLinks:19316}   Review of Systems {ros; complete:30496}   Objective:    BP (!) 149/75 (BP Location: Left Arm, Patient Position: Sitting, Cuff Size: Normal)   Pulse (!) 53   Temp 98 F (36.7 C) (Oral)   Resp 17   Ht 5\' 1"  (1.549 m)   Wt 137 lb (62.1 kg)   SpO2 97%   BMI 25.89 kg/m  Right knee: {exam; knee:14093::"normal","no effusion, full active range of motion, no joint line tenderness, ligamentous structures intact."}  Left knee:  {exam; knee:14093::"normal","no effusion, full active range of motion, no joint line tenderness, ligamentous structures intact."}   Pain on patelofelmoral ligmatne and at lateral compartment.    X-ray {knee; r/l/bothL:19510}: {normal:5769::"no fracture, dislocation, swelling or degenerative changes noted"}     BP Readings from Last 3 Encounters:  06/16/16 (!) 149/75  03/18/16 (!) 144/80  01/26/16 (!) 151/86    Assessment:    {right/Left:5607} {Knee pain dx list:14094}    Plan:    {knee pain tx list:14095}

## 2016-06-17 LAB — CMP14+EGFR
A/G RATIO: 1.6 (ref 1.2–2.2)
ALBUMIN: 4.5 g/dL (ref 3.5–4.8)
ALT: 49 IU/L — ABNORMAL HIGH (ref 0–44)
AST: 35 IU/L (ref 0–40)
Alkaline Phosphatase: 78 IU/L (ref 39–117)
BILIRUBIN TOTAL: 0.5 mg/dL (ref 0.0–1.2)
BUN/Creatinine Ratio: 19 (ref 10–24)
BUN: 23 mg/dL (ref 8–27)
CHLORIDE: 105 mmol/L (ref 96–106)
CO2: 22 mmol/L (ref 18–29)
Calcium: 9.6 mg/dL (ref 8.6–10.2)
Creatinine, Ser: 1.21 mg/dL (ref 0.76–1.27)
GFR calc Af Amer: 67 mL/min/{1.73_m2} (ref >59)
GFR calc non Af Amer: 58 mL/min/{1.73_m2} — ABNORMAL LOW (ref >59)
GLUCOSE: 100 mg/dL — AB (ref 65–99)
Globulin, Total: 2.8 g/dL (ref 1.5–4.5)
Potassium: 5.1 mmol/L (ref 3.5–5.2)
Sodium: 140 mmol/L (ref 134–144)
TOTAL PROTEIN: 7.3 g/dL (ref 6.0–8.5)

## 2016-10-22 DIAGNOSIS — H25813 Combined forms of age-related cataract, bilateral: Secondary | ICD-10-CM | POA: Diagnosis not present

## 2016-10-22 DIAGNOSIS — H11003 Unspecified pterygium of eye, bilateral: Secondary | ICD-10-CM | POA: Diagnosis not present

## 2016-11-14 DIAGNOSIS — R69 Illness, unspecified: Secondary | ICD-10-CM | POA: Diagnosis not present

## 2016-12-08 ENCOUNTER — Encounter (HOSPITAL_COMMUNITY): Admission: EM | Disposition: A | Discharge status: Home / Self Care | Payer: Self-pay | Source: Home / Self Care

## 2016-12-08 ENCOUNTER — Other Ambulatory Visit: Payer: Self-pay

## 2016-12-08 ENCOUNTER — Observation Stay (HOSPITAL_COMMUNITY): Payer: Medicare HMO | Admitting: Certified Registered Nurse Anesthetist

## 2016-12-08 ENCOUNTER — Emergency Department (HOSPITAL_COMMUNITY): Payer: Medicare HMO

## 2016-12-08 ENCOUNTER — Encounter (HOSPITAL_COMMUNITY): Payer: Self-pay | Admitting: Emergency Medicine

## 2016-12-08 ENCOUNTER — Inpatient Hospital Stay (HOSPITAL_COMMUNITY)
Admission: EM | Admit: 2016-12-08 | Discharge: 2016-12-10 | DRG: 342 | Disposition: A | Discharge status: Home / Self Care | Payer: Medicare HMO | Attending: Surgery | Admitting: Surgery

## 2016-12-08 DIAGNOSIS — N2889 Other specified disorders of kidney and ureter: Secondary | ICD-10-CM | POA: Diagnosis not present

## 2016-12-08 DIAGNOSIS — K3532 Acute appendicitis with perforation and localized peritonitis, without abscess: Secondary | ICD-10-CM

## 2016-12-08 DIAGNOSIS — K3531 Acute appendicitis with localized peritonitis and gangrene, without perforation: Secondary | ICD-10-CM | POA: Diagnosis present

## 2016-12-08 DIAGNOSIS — Z85820 Personal history of malignant melanoma of skin: Secondary | ICD-10-CM | POA: Diagnosis not present

## 2016-12-08 DIAGNOSIS — Z789 Other specified health status: Secondary | ICD-10-CM

## 2016-12-08 DIAGNOSIS — Z87891 Personal history of nicotine dependence: Secondary | ICD-10-CM | POA: Diagnosis not present

## 2016-12-08 DIAGNOSIS — N183 Chronic kidney disease, stage 3 unspecified: Secondary | ICD-10-CM

## 2016-12-08 DIAGNOSIS — E860 Dehydration: Secondary | ICD-10-CM | POA: Diagnosis present

## 2016-12-08 DIAGNOSIS — N289 Disorder of kidney and ureter, unspecified: Secondary | ICD-10-CM

## 2016-12-08 DIAGNOSIS — Z79899 Other long term (current) drug therapy: Secondary | ICD-10-CM | POA: Diagnosis not present

## 2016-12-08 DIAGNOSIS — I480 Paroxysmal atrial fibrillation: Secondary | ICD-10-CM | POA: Insufficient documentation

## 2016-12-08 DIAGNOSIS — R112 Nausea with vomiting, unspecified: Secondary | ICD-10-CM | POA: Diagnosis not present

## 2016-12-08 DIAGNOSIS — K859 Acute pancreatitis without necrosis or infection, unspecified: Secondary | ICD-10-CM | POA: Diagnosis not present

## 2016-12-08 DIAGNOSIS — I129 Hypertensive chronic kidney disease with stage 1 through stage 4 chronic kidney disease, or unspecified chronic kidney disease: Secondary | ICD-10-CM | POA: Diagnosis present

## 2016-12-08 DIAGNOSIS — E872 Acidosis: Secondary | ICD-10-CM | POA: Diagnosis not present

## 2016-12-08 DIAGNOSIS — M109 Gout, unspecified: Secondary | ICD-10-CM | POA: Diagnosis present

## 2016-12-08 DIAGNOSIS — K3580 Unspecified acute appendicitis: Secondary | ICD-10-CM

## 2016-12-08 DIAGNOSIS — R1031 Right lower quadrant pain: Secondary | ICD-10-CM | POA: Diagnosis not present

## 2016-12-08 DIAGNOSIS — E785 Hyperlipidemia, unspecified: Secondary | ICD-10-CM | POA: Diagnosis not present

## 2016-12-08 DIAGNOSIS — I10 Essential (primary) hypertension: Secondary | ICD-10-CM | POA: Diagnosis not present

## 2016-12-08 DIAGNOSIS — N179 Acute kidney failure, unspecified: Secondary | ICD-10-CM | POA: Diagnosis present

## 2016-12-08 DIAGNOSIS — K5730 Diverticulosis of large intestine without perforation or abscess without bleeding: Secondary | ICD-10-CM | POA: Diagnosis not present

## 2016-12-08 DIAGNOSIS — I4891 Unspecified atrial fibrillation: Secondary | ICD-10-CM | POA: Diagnosis not present

## 2016-12-08 DIAGNOSIS — K3589 Other acute appendicitis without perforation or gangrene: Secondary | ICD-10-CM | POA: Diagnosis not present

## 2016-12-08 DIAGNOSIS — R7989 Other specified abnormal findings of blood chemistry: Secondary | ICD-10-CM

## 2016-12-08 DIAGNOSIS — R509 Fever, unspecified: Secondary | ICD-10-CM | POA: Diagnosis not present

## 2016-12-08 HISTORY — DX: Paroxysmal atrial fibrillation: I48.0

## 2016-12-08 HISTORY — PX: LAPAROSCOPIC APPENDECTOMY: SHX408

## 2016-12-08 LAB — BLOOD CULTURE ID PANEL (REFLEXED)
ACINETOBACTER BAUMANNII: NOT DETECTED
CANDIDA ALBICANS: NOT DETECTED
CANDIDA KRUSEI: NOT DETECTED
CANDIDA PARAPSILOSIS: NOT DETECTED
CARBAPENEM RESISTANCE: NOT DETECTED
Candida glabrata: NOT DETECTED
Candida tropicalis: NOT DETECTED
ENTEROBACTERIACEAE SPECIES: DETECTED — AB
Enterobacter cloacae complex: NOT DETECTED
Enterococcus species: NOT DETECTED
Escherichia coli: DETECTED — AB
Haemophilus influenzae: NOT DETECTED
KLEBSIELLA OXYTOCA: NOT DETECTED
KLEBSIELLA PNEUMONIAE: NOT DETECTED
Listeria monocytogenes: NOT DETECTED
Neisseria meningitidis: NOT DETECTED
PROTEUS SPECIES: NOT DETECTED
PSEUDOMONAS AERUGINOSA: NOT DETECTED
STAPHYLOCOCCUS AUREUS BCID: NOT DETECTED
STREPTOCOCCUS PYOGENES: NOT DETECTED
Serratia marcescens: NOT DETECTED
Staphylococcus species: NOT DETECTED
Streptococcus agalactiae: NOT DETECTED
Streptococcus pneumoniae: NOT DETECTED
Streptococcus species: NOT DETECTED

## 2016-12-08 LAB — RAPID URINE DRUG SCREEN, HOSP PERFORMED
AMPHETAMINES: NOT DETECTED
BARBITURATES: NOT DETECTED
BENZODIAZEPINES: NOT DETECTED
Cocaine: NOT DETECTED
Opiates: POSITIVE — AB
TETRAHYDROCANNABINOL: NOT DETECTED

## 2016-12-08 LAB — HEPATIC FUNCTION PANEL
ALT: 36 U/L (ref 17–63)
AST: 38 U/L (ref 15–41)
Albumin: 3.9 g/dL (ref 3.5–5.0)
Alkaline Phosphatase: 87 U/L (ref 38–126)
BILIRUBIN DIRECT: 0.4 mg/dL (ref 0.1–0.5)
BILIRUBIN INDIRECT: 1.1 mg/dL — AB (ref 0.3–0.9)
TOTAL PROTEIN: 7.2 g/dL (ref 6.5–8.1)
Total Bilirubin: 1.5 mg/dL — ABNORMAL HIGH (ref 0.3–1.2)

## 2016-12-08 LAB — LIPASE, BLOOD: Lipase: 25 U/L (ref 11–51)

## 2016-12-08 LAB — BASIC METABOLIC PANEL
ANION GAP: 16 — AB (ref 5–15)
BUN: 22 mg/dL — ABNORMAL HIGH (ref 6–20)
CALCIUM: 9.4 mg/dL (ref 8.9–10.3)
CO2: 18 mmol/L — ABNORMAL LOW (ref 22–32)
CREATININE: 1.45 mg/dL — AB (ref 0.61–1.24)
Chloride: 106 mmol/L (ref 101–111)
GFR, EST AFRICAN AMERICAN: 53 mL/min — AB (ref >60)
GFR, EST NON AFRICAN AMERICAN: 46 mL/min — AB (ref >60)
Glucose, Bld: 182 mg/dL — ABNORMAL HIGH (ref 65–99)
Potassium: 3.6 mmol/L (ref 3.5–5.1)
Sodium: 140 mmol/L (ref 135–145)

## 2016-12-08 LAB — CBC WITH DIFFERENTIAL/PLATELET
BASOS ABS: 0 10*3/uL (ref 0.0–0.1)
BASOS PCT: 0 %
EOS ABS: 0 10*3/uL (ref 0.0–0.7)
EOS PCT: 0 %
HEMATOCRIT: 46 % (ref 39.0–52.0)
Hemoglobin: 16.3 g/dL (ref 13.0–17.0)
Lymphocytes Relative: 7 %
Lymphs Abs: 0.5 10*3/uL — ABNORMAL LOW (ref 0.7–4.0)
MCH: 31.5 pg (ref 26.0–34.0)
MCHC: 35.4 g/dL (ref 30.0–36.0)
MCV: 88.8 fL (ref 78.0–100.0)
MONO ABS: 0.1 10*3/uL (ref 0.1–1.0)
Monocytes Relative: 1 %
NEUTROS ABS: 6.7 10*3/uL (ref 1.7–7.7)
Neutrophils Relative %: 92 %
PLATELETS: 138 10*3/uL — AB (ref 150–400)
RBC: 5.18 MIL/uL (ref 4.22–5.81)
RDW: 12.8 % (ref 11.5–15.5)
WBC: 7.3 10*3/uL (ref 4.0–10.5)

## 2016-12-08 LAB — I-STAT CG4 LACTIC ACID, ED
LACTIC ACID, VENOUS: 3.14 mmol/L — AB (ref 0.5–1.9)
Lactic Acid, Venous: 5.49 mmol/L (ref 0.5–1.9)

## 2016-12-08 SURGERY — APPENDECTOMY, LAPAROSCOPIC
Anesthesia: General | Site: Abdomen

## 2016-12-08 MED ORDER — HYDROMORPHONE HCL 1 MG/ML IJ SOLN: 0.5000 mg | INTRAMUSCULAR | Status: DC | PRN

## 2016-12-08 MED ORDER — 0.9 % SODIUM CHLORIDE (POUR BTL) OPTIME
TOPICAL | Status: DC | PRN
  Administered 2016-12-08: 1000 mL

## 2016-12-08 MED ORDER — LIDOCAINE 2% (20 MG/ML) 5 ML SYRINGE
INTRAMUSCULAR | Status: AC
  Filled 2016-12-08: qty 5

## 2016-12-08 MED ORDER — DILTIAZEM HCL ER 60 MG PO CP12
120.0000 mg | ORAL_CAPSULE | Freq: Two times a day (BID) | ORAL | Status: DC
  Administered 2016-12-08: 120 mg via ORAL
  Filled 2016-12-08: qty 2

## 2016-12-08 MED ORDER — IOPAMIDOL (ISOVUE-300) INJECTION 61%
100.0000 mL | Freq: Once | INTRAVENOUS | Status: AC | PRN
  Administered 2016-12-08: 80 mL via INTRAVENOUS

## 2016-12-08 MED ORDER — ONDANSETRON 4 MG PO TBDP: 4.0000 mg | ORAL_TABLET | Freq: Four times a day (QID) | ORAL | Status: DC | PRN

## 2016-12-08 MED ORDER — DILTIAZEM LOAD VIA INFUSION
5.0000 mg | Freq: Once | INTRAVENOUS | Status: DC
  Filled 2016-12-08: qty 5

## 2016-12-08 MED ORDER — CHLORHEXIDINE GLUCONATE CLOTH 2 % EX PADS: 6.0000 | MEDICATED_PAD | Freq: Once | CUTANEOUS | Status: DC

## 2016-12-08 MED ORDER — PHENYLEPHRINE 40 MCG/ML (10ML) SYRINGE FOR IV PUSH (FOR BLOOD PRESSURE SUPPORT)
PREFILLED_SYRINGE | INTRAVENOUS | Status: DC | PRN
  Administered 2016-12-08: 40 ug via INTRAVENOUS
  Administered 2016-12-08: 80 ug via INTRAVENOUS
  Administered 2016-12-08: 120 ug via INTRAVENOUS

## 2016-12-08 MED ORDER — SIMETHICONE 80 MG PO CHEW: 40.0000 mg | CHEWABLE_TABLET | Freq: Four times a day (QID) | ORAL | Status: DC | PRN

## 2016-12-08 MED ORDER — HYDRALAZINE HCL 20 MG/ML IJ SOLN: 5.0000 mg | INTRAMUSCULAR | Status: DC | PRN

## 2016-12-08 MED ORDER — BISACODYL 10 MG RE SUPP: 10.0000 mg | Freq: Two times a day (BID) | RECTAL | Status: DC | PRN

## 2016-12-08 MED ORDER — SODIUM CHLORIDE 0.9 % IV BOLUS (SEPSIS)
2000.0000 mL | Freq: Once | INTRAVENOUS | Status: AC
  Administered 2016-12-08: 2000 mL via INTRAVENOUS

## 2016-12-08 MED ORDER — LACTATED RINGERS IV BOLUS (SEPSIS): 1000.0000 mL | Freq: Three times a day (TID) | INTRAVENOUS | Status: AC | PRN

## 2016-12-08 MED ORDER — ONDANSETRON HCL 4 MG/2ML IJ SOLN
4.0000 mg | Freq: Four times a day (QID) | INTRAMUSCULAR | Status: DC | PRN
  Administered 2016-12-08: 4 mg via INTRAVENOUS

## 2016-12-08 MED ORDER — LACTATED RINGERS IR SOLN
Status: DC | PRN
  Administered 2016-12-08: 3000 mL

## 2016-12-08 MED ORDER — ACETAMINOPHEN 500 MG PO TABS
1000.0000 mg | ORAL_TABLET | Freq: Three times a day (TID) | ORAL | Status: DC
  Administered 2016-12-08 – 2016-12-10 (×7): 1000 mg via ORAL
  Filled 2016-12-08 (×7): qty 2

## 2016-12-08 MED ORDER — DEXAMETHASONE SODIUM PHOSPHATE 4 MG/ML IJ SOLN
INTRAMUSCULAR | Status: DC | PRN
  Administered 2016-12-08: 10 mg via INTRAVENOUS

## 2016-12-08 MED ORDER — FENTANYL CITRATE (PF) 100 MCG/2ML IJ SOLN
INTRAMUSCULAR | Status: DC | PRN
  Administered 2016-12-08 (×4): 50 ug via INTRAVENOUS

## 2016-12-08 MED ORDER — HYDROCORTISONE 1 % EX CREA: 1.0000 "application " | TOPICAL_CREAM | Freq: Three times a day (TID) | CUTANEOUS | Status: DC | PRN

## 2016-12-08 MED ORDER — GABAPENTIN 300 MG PO CAPS
300.0000 mg | ORAL_CAPSULE | Freq: Every day | ORAL | Status: DC
  Administered 2016-12-08 – 2016-12-09 (×2): 300 mg via ORAL
  Filled 2016-12-08 (×2): qty 1

## 2016-12-08 MED ORDER — FENTANYL CITRATE (PF) 100 MCG/2ML IJ SOLN: 25.0000 ug | INTRAMUSCULAR | Status: DC | PRN

## 2016-12-08 MED ORDER — MENTHOL 3 MG MT LOZG: 1.0000 | LOZENGE | OROMUCOSAL | Status: DC | PRN

## 2016-12-08 MED ORDER — LIDOCAINE 2% (20 MG/ML) 5 ML SYRINGE
INTRAMUSCULAR | Status: DC | PRN
  Administered 2016-12-08: 80 mg via INTRAVENOUS

## 2016-12-08 MED ORDER — DILTIAZEM HCL ER 60 MG PO CP12
120.0000 mg | ORAL_CAPSULE | Freq: Two times a day (BID) | ORAL | Status: DC
  Administered 2016-12-09: 120 mg via ORAL
  Filled 2016-12-08: qty 2

## 2016-12-08 MED ORDER — OXYCODONE HCL 5 MG PO TABS: 5.0000 mg | ORAL_TABLET | ORAL | Status: DC | PRN

## 2016-12-08 MED ORDER — IOPAMIDOL (ISOVUE-300) INJECTION 61%
INTRAVENOUS | Status: AC
  Administered 2016-12-08: 80 mL via INTRAVENOUS
  Filled 2016-12-08: qty 100

## 2016-12-08 MED ORDER — ONDANSETRON HCL 4 MG/2ML IJ SOLN: 4.0000 mg | Freq: Once | INTRAMUSCULAR | Status: DC | PRN

## 2016-12-08 MED ORDER — LACTATED RINGERS IV SOLN: 1000.0000 mL | Freq: Three times a day (TID) | INTRAVENOUS | Status: AC | PRN

## 2016-12-08 MED ORDER — ONDANSETRON HCL 4 MG/2ML IJ SOLN
INTRAMUSCULAR | Status: AC
  Filled 2016-12-08: qty 2

## 2016-12-08 MED ORDER — DEXAMETHASONE SODIUM PHOSPHATE 10 MG/ML IJ SOLN
INTRAMUSCULAR | Status: AC
  Filled 2016-12-08: qty 1

## 2016-12-08 MED ORDER — PROCHLORPERAZINE EDISYLATE 5 MG/ML IJ SOLN: 5.0000 mg | INTRAMUSCULAR | Status: DC | PRN

## 2016-12-08 MED ORDER — PHENOL 1.4 % MT LIQD: 1.0000 | OROMUCOSAL | Status: DC | PRN

## 2016-12-08 MED ORDER — EPHEDRINE SULFATE-NACL 50-0.9 MG/10ML-% IV SOSY
PREFILLED_SYRINGE | INTRAVENOUS | Status: DC | PRN
  Administered 2016-12-08: 5 mg via INTRAVENOUS
  Administered 2016-12-08: 15 mg via INTRAVENOUS
  Administered 2016-12-08: 5 mg via INTRAVENOUS

## 2016-12-08 MED ORDER — DEXTROSE 5 % IV SOLN
2.0000 g | Freq: Once | INTRAVENOUS | Status: AC
  Administered 2016-12-08: 2 g via INTRAVENOUS
  Filled 2016-12-08: qty 2

## 2016-12-08 MED ORDER — BUPIVACAINE-EPINEPHRINE 0.25% -1:200000 IJ SOLN
INTRAMUSCULAR | Status: DC | PRN
  Administered 2016-12-08: 50 mL

## 2016-12-08 MED ORDER — ALUM & MAG HYDROXIDE-SIMETH 200-200-20 MG/5ML PO SUSP: 30.0000 mL | Freq: Four times a day (QID) | ORAL | Status: DC | PRN

## 2016-12-08 MED ORDER — DILTIAZEM HCL-DEXTROSE 100-5 MG/100ML-% IV SOLN (PREMIX)
5.0000 mg/h | INTRAVENOUS | Status: DC
  Filled 2016-12-08: qty 100

## 2016-12-08 MED ORDER — GUAIFENESIN-DM 100-10 MG/5ML PO SYRP: 10.0000 mL | ORAL_SOLUTION | ORAL | Status: DC | PRN

## 2016-12-08 MED ORDER — SUGAMMADEX SODIUM 200 MG/2ML IV SOLN
INTRAVENOUS | Status: AC
  Filled 2016-12-08: qty 2

## 2016-12-08 MED ORDER — DIPHENHYDRAMINE HCL 50 MG/ML IJ SOLN: 12.5000 mg | Freq: Four times a day (QID) | INTRAMUSCULAR | Status: DC | PRN

## 2016-12-08 MED ORDER — PROPOFOL 10 MG/ML IV BOLUS
INTRAVENOUS | Status: DC | PRN
Start: 2016-12-08 — End: 2016-12-08
  Administered 2016-12-08: 100 mg via INTRAVENOUS
  Administered 2016-12-08: 40 mg via INTRAVENOUS

## 2016-12-08 MED ORDER — BUPIVACAINE-EPINEPHRINE 0.25% -1:200000 IJ SOLN
INTRAMUSCULAR | Status: AC
  Filled 2016-12-08: qty 1

## 2016-12-08 MED ORDER — ONDANSETRON HCL 4 MG/2ML IJ SOLN: 4.0000 mg | Freq: Four times a day (QID) | INTRAMUSCULAR | Status: DC | PRN

## 2016-12-08 MED ORDER — MORPHINE SULFATE (PF) 2 MG/ML IV SOLN: 2.0000 mg | INTRAVENOUS | Status: DC | PRN

## 2016-12-08 MED ORDER — PROPOFOL 10 MG/ML IV BOLUS
INTRAVENOUS | Status: AC
  Filled 2016-12-08: qty 20

## 2016-12-08 MED ORDER — LIP MEDEX EX OINT
1.0000 "application " | TOPICAL_OINTMENT | Freq: Two times a day (BID) | CUTANEOUS | Status: DC
  Administered 2016-12-08 – 2016-12-10 (×2): 1 via TOPICAL
  Filled 2016-12-08 (×2): qty 7

## 2016-12-08 MED ORDER — GABAPENTIN 300 MG PO CAPS: 300.0000 mg | ORAL_CAPSULE | ORAL | Status: DC

## 2016-12-08 MED ORDER — ACETAMINOPHEN 500 MG PO TABS: 1000.0000 mg | ORAL_TABLET | ORAL | Status: DC

## 2016-12-08 MED ORDER — MAGIC MOUTHWASH
15.0000 mL | Freq: Four times a day (QID) | ORAL | Status: DC | PRN
  Filled 2016-12-08: qty 15

## 2016-12-08 MED ORDER — METRONIDAZOLE IN NACL 5-0.79 MG/ML-% IV SOLN
500.0000 mg | Freq: Once | INTRAVENOUS | Status: AC
  Administered 2016-12-08: 500 mg via INTRAVENOUS
  Filled 2016-12-08: qty 100

## 2016-12-08 MED ORDER — MORPHINE SULFATE (PF) 4 MG/ML IV SOLN
4.0000 mg | Freq: Once | INTRAVENOUS | Status: AC
  Administered 2016-12-08: 4 mg via INTRAVENOUS
  Filled 2016-12-08: qty 1

## 2016-12-08 MED ORDER — SODIUM CHLORIDE 0.9 % IV BOLUS (SEPSIS): 1000.0000 mL | Freq: Once | INTRAVENOUS | Status: DC

## 2016-12-08 MED ORDER — LACTATED RINGERS IV SOLN
INTRAVENOUS | Status: DC
  Administered 2016-12-08 (×2): via INTRAVENOUS

## 2016-12-08 MED ORDER — DIPHENHYDRAMINE HCL 12.5 MG/5ML PO ELIX: 12.5000 mg | ORAL_SOLUTION | Freq: Four times a day (QID) | ORAL | Status: DC | PRN

## 2016-12-08 MED ORDER — ENOXAPARIN SODIUM 40 MG/0.4ML ~~LOC~~ SOLN
40.0000 mg | SUBCUTANEOUS | Status: DC
Start: 2016-12-09 — End: 2016-12-10
  Administered 2016-12-09: 40 mg via SUBCUTANEOUS
  Filled 2016-12-08: qty 0.4

## 2016-12-08 MED ORDER — HYDROCORTISONE 2.5 % RE CREA: 1.0000 "application " | TOPICAL_CREAM | Freq: Four times a day (QID) | RECTAL | Status: DC | PRN

## 2016-12-08 MED ORDER — COLCHICINE 0.6 MG PO TABS
0.6000 mg | ORAL_TABLET | Freq: Two times a day (BID) | ORAL | Status: DC | PRN
  Filled 2016-12-08: qty 1

## 2016-12-08 MED ORDER — CEFTRIAXONE SODIUM 2 G IJ SOLR: 2.0000 g | INTRAMUSCULAR | Status: DC

## 2016-12-08 MED ORDER — METHOCARBAMOL 500 MG PO TABS: 500.0000 mg | ORAL_TABLET | Freq: Four times a day (QID) | ORAL | Status: DC | PRN

## 2016-12-08 MED ORDER — ROCURONIUM BROMIDE 50 MG/5ML IV SOSY
PREFILLED_SYRINGE | INTRAVENOUS | Status: AC
  Filled 2016-12-08: qty 5

## 2016-12-08 MED ORDER — ROCURONIUM BROMIDE 50 MG/5ML IV SOSY
PREFILLED_SYRINGE | INTRAVENOUS | Status: DC | PRN
  Administered 2016-12-08: 50 mg via INTRAVENOUS

## 2016-12-08 MED ORDER — METRONIDAZOLE IN NACL 5-0.79 MG/ML-% IV SOLN: 500.0000 mg | INTRAVENOUS | Status: DC

## 2016-12-08 MED ORDER — SODIUM CHLORIDE 0.9 % IV BOLUS (SEPSIS)
1000.0000 mL | Freq: Once | INTRAVENOUS | Status: AC
  Administered 2016-12-08: 1000 mL via INTRAVENOUS

## 2016-12-08 MED ORDER — SODIUM CHLORIDE 0.9 % IV SOLN
INTRAVENOUS | Status: AC
  Administered 2016-12-08 – 2016-12-09 (×2): via INTRAVENOUS

## 2016-12-08 MED ORDER — ONDANSETRON HCL 4 MG/2ML IJ SOLN
4.0000 mg | Freq: Once | INTRAMUSCULAR | Status: AC
  Administered 2016-12-08: 4 mg via INTRAVENOUS
  Filled 2016-12-08: qty 2

## 2016-12-08 MED ORDER — POLYETHYLENE GLYCOL 3350 17 G PO PACK: 17.0000 g | PACK | Freq: Every day | ORAL | Status: DC | PRN

## 2016-12-08 MED ORDER — FENTANYL CITRATE (PF) 250 MCG/5ML IJ SOLN
INTRAMUSCULAR | Status: AC
  Filled 2016-12-08: qty 5

## 2016-12-08 MED ORDER — PIPERACILLIN-TAZOBACTAM 3.375 G IVPB
3.3750 g | Freq: Three times a day (TID) | INTRAVENOUS | Status: DC
  Administered 2016-12-08 – 2016-12-10 (×7): 3.375 g via INTRAVENOUS
  Filled 2016-12-08 (×7): qty 50

## 2016-12-08 SURGICAL SUPPLY — 47 items
APPLIER CLIP 5 13 M/L LIGAMAX5 (MISCELLANEOUS)
APPLIER CLIP ROT 10 11.4 M/L (STAPLE)
CABLE HIGH FREQUENCY MONO STRZ (ELECTRODE) ×3 IMPLANT
CHLORAPREP W/TINT 26ML (MISCELLANEOUS) ×3 IMPLANT
CLIP APPLIE 5 13 M/L LIGAMAX5 (MISCELLANEOUS) IMPLANT
CLIP APPLIE ROT 10 11.4 M/L (STAPLE) IMPLANT
COVER SURGICAL LIGHT HANDLE (MISCELLANEOUS) ×3 IMPLANT
CUTTER FLEX LINEAR 45M (STAPLE) ×3 IMPLANT
DECANTER SPIKE VIAL GLASS SM (MISCELLANEOUS) ×3 IMPLANT
DEVICE TROCAR PUNCTURE CLOSURE (ENDOMECHANICALS) IMPLANT
DRAPE LAPAROSCOPIC ABDOMINAL (DRAPES) ×3 IMPLANT
DRAPE WARM FLUID 44X44 (DRAPE) ×3 IMPLANT
DRSG TEGADERM 2-3/8X2-3/4 SM (GAUZE/BANDAGES/DRESSINGS) ×3 IMPLANT
DRSG TEGADERM 4X4.75 (GAUZE/BANDAGES/DRESSINGS) ×3 IMPLANT
ELECT REM PT RETURN 15FT ADLT (MISCELLANEOUS) ×3 IMPLANT
ENDOLOOP SUT PDS II  0 18 (SUTURE) ×2
ENDOLOOP SUT PDS II 0 18 (SUTURE) ×1 IMPLANT
GAUZE SPONGE 2X2 8PLY STRL LF (GAUZE/BANDAGES/DRESSINGS) ×1 IMPLANT
GLOVE ECLIPSE 8.0 STRL XLNG CF (GLOVE) ×3 IMPLANT
GLOVE INDICATOR 8.0 STRL GRN (GLOVE) ×3 IMPLANT
GOWN STRL REUS W/TWL XL LVL3 (GOWN DISPOSABLE) ×6 IMPLANT
IRRIG SUCT STRYKERFLOW 2 WTIP (MISCELLANEOUS) ×3
IRRIGATION SUCT STRKRFLW 2 WTP (MISCELLANEOUS) ×1 IMPLANT
KIT BASIN OR (CUSTOM PROCEDURE TRAY) ×3 IMPLANT
PAD POSITIONING PINK XL (MISCELLANEOUS) ×3 IMPLANT
POUCH RETRIEVAL ECOSAC 10 (ENDOMECHANICALS) ×1 IMPLANT
POUCH RETRIEVAL ECOSAC 10MM (ENDOMECHANICALS) ×2
POUCH SPECIMEN RETRIEVAL 10MM (ENDOMECHANICALS) ×3 IMPLANT
RELOAD 45 VASCULAR/THIN (ENDOMECHANICALS) IMPLANT
RELOAD STAPLE TA45 3.5 REG BLU (ENDOMECHANICALS) IMPLANT
SCISSORS LAP 5X35 DISP (ENDOMECHANICALS) ×3 IMPLANT
SHEARS HARMONIC ACE PLUS 36CM (ENDOMECHANICALS) IMPLANT
SLEEVE XCEL OPT CAN 5 100 (ENDOMECHANICALS) ×3 IMPLANT
SPONGE GAUZE 2X2 STER 10/PKG (GAUZE/BANDAGES/DRESSINGS) ×2
SUT MNCRL AB 4-0 PS2 18 (SUTURE) ×3 IMPLANT
SUT PDS AB 0 CT1 36 (SUTURE) IMPLANT
SUT PDS AB 1 CT1 27 (SUTURE) IMPLANT
SUT SILK 2 0 SH (SUTURE) IMPLANT
SUT VIC AB 2-0 SH 27 (SUTURE) ×4
SUT VIC AB 2-0 SH 27X BRD (SUTURE) ×2 IMPLANT
SUT VICRYL 0 UR6 27IN ABS (SUTURE) ×3 IMPLANT
TOWEL OR 17X26 10 PK STRL BLUE (TOWEL DISPOSABLE) ×3 IMPLANT
TRAY FOLEY W/METER SILVER 16FR (SET/KITS/TRAYS/PACK) IMPLANT
TRAY LAPAROSCOPIC (CUSTOM PROCEDURE TRAY) ×3 IMPLANT
TROCAR BLADELESS OPT 5 100 (ENDOMECHANICALS) ×3 IMPLANT
TROCAR XCEL 12X100 BLDLESS (ENDOMECHANICALS) ×3 IMPLANT
TUBING INSUF HEATED (TUBING) ×3 IMPLANT

## 2016-12-08 NOTE — H&P (Signed)
Reginald Long is an 76 y.o. male.   Chief Complaint: Nausea and vomiting, abdominal pain  HPI: Patient is a 76 year old [place] male who approximately 24 hours ago developed the onset of frequent nausea and vomiting.  At the same time he developed gradually worsening constant right lower quadrant abdominal pain.  The pain has not been severe.  The vomiting has been the most marked symptom.  He had a little bit of loose bowel movement.  No fever or chills.  He has no chronic GI complaints.  No GU symptoms.  Past Medical History:  Diagnosis Date  . Gout   . Hyperlipidemia   . Hypertension     History reviewed. No pertinent surgical history.  No family history on file. Social History:  reports that he has quit smoking. he has never used smokeless tobacco. He reports that he does not drink alcohol or use drugs.  Allergies: No Known Allergies  No current facility-administered medications for this encounter.    Current Outpatient Medications  Medication Sig Dispense Refill  . colchicine 0.6 MG tablet Take two tablets at onset of flare up then 0.29m one hour later. (Patient taking differently: Take 0.6 mg 2 (two) times daily as needed by mouth (Gout). ) 15 tablet 2  . lisinopril (PRINIVIL,ZESTRIL) 20 MG tablet Take 1 tablet (20 mg total) by mouth daily. 90 tablet 1  . amLODipine (NORVASC) 2.5 MG tablet Take 1 tablet (2.5 mg total) by mouth daily. (Patient not taking: Reported on 12/08/2016) 90 tablet 0  . diclofenac sodium (VOLTAREN) 1 % GEL Apply 4 g topically 4 (four) times daily. (Patient not taking: Reported on 12/08/2016) 100 g 2     Results for orders placed or performed during the hospital encounter of 12/08/16 (from the past 48 hour(s))  CBC with Differential     Status: Abnormal   Collection Time: 12/08/16  3:11 AM  Result Value Ref Range   WBC 7.3 4.0 - 10.5 K/uL   RBC 5.18 4.22 - 5.81 MIL/uL   Hemoglobin 16.3 13.0 - 17.0 g/dL   HCT 46.0 39.0 - 52.0 %   MCV 88.8 78.0 - 100.0 fL    MCH 31.5 26.0 - 34.0 pg   MCHC 35.4 30.0 - 36.0 g/dL   RDW 12.8 11.5 - 15.5 %   Platelets 138 (L) 150 - 400 K/uL   Neutrophils Relative % 92 %   Neutro Abs 6.7 1.7 - 7.7 K/uL   Lymphocytes Relative 7 %   Lymphs Abs 0.5 (L) 0.7 - 4.0 K/uL   Monocytes Relative 1 %   Monocytes Absolute 0.1 0.1 - 1.0 K/uL   Eosinophils Relative 0 %   Eosinophils Absolute 0.0 0.0 - 0.7 K/uL   Basophils Relative 0 %   Basophils Absolute 0.0 0.0 - 0.1 K/uL  Basic metabolic panel     Status: Abnormal   Collection Time: 12/08/16  3:11 AM  Result Value Ref Range   Sodium 140 135 - 145 mmol/L   Potassium 3.6 3.5 - 5.1 mmol/L   Chloride 106 101 - 111 mmol/L   CO2 18 (L) 22 - 32 mmol/L   Glucose, Bld 182 (H) 65 - 99 mg/dL   BUN 22 (H) 6 - 20 mg/dL   Creatinine, Ser 1.45 (H) 0.61 - 1.24 mg/dL   Calcium 9.4 8.9 - 10.3 mg/dL   GFR calc non Af Amer 46 (L) >60 mL/min   GFR calc Af Amer 53 (L) >60 mL/min    Comment: (NOTE) The  eGFR has been calculated using the CKD EPI equation. This calculation has not been validated in all clinical situations. eGFR's persistently <60 mL/min signify possible Chronic Kidney Disease.    Anion gap 16 (H) 5 - 15  Lipase, blood     Status: None   Collection Time: 12/08/16  3:11 AM  Result Value Ref Range   Lipase 25 11 - 51 U/L  I-Stat CG4 Lactic Acid, ED     Status: Abnormal   Collection Time: 12/08/16  3:31 AM  Result Value Ref Range   Lactic Acid, Venous 5.49 (HH) 0.5 - 1.9 mmol/L   Comment NOTIFIED PHYSICIAN   I-Stat CG4 Lactic Acid, ED     Status: Abnormal   Collection Time: 12/08/16  5:16 AM  Result Value Ref Range   Lactic Acid, Venous 3.14 (HH) 0.5 - 1.9 mmol/L   Comment NOTIFIED PHYSICIAN    Dg Chest 2 View  Result Date: 12/08/2016 CLINICAL DATA:  Fever and hypertension.  Nonsmoker. EXAM: CHEST  2 VIEW COMPARISON:  None. FINDINGS: Elevation of the right hemidiaphragm. Cardiac enlargement without vascular congestion or edema. No consolidation or airspace  disease in the lungs. No blunting of costophrenic angles. No pneumothorax. Mediastinal contours appear intact. IMPRESSION: Cardiac enlargement.  No evidence of active pulmonary disease. Electronically Signed   By: Lucienne Capers M.D.   On: 12/08/2016 05:05   Ct Abdomen Pelvis W Contrast  Result Date: 12/08/2016 CLINICAL DATA:  Nausea, vomiting and diarrhea for 1 day. Poor intake. Assess abdominal pain, suspect diverticulitis. History of hypertension. EXAM: CT ABDOMEN AND PELVIS WITH CONTRAST TECHNIQUE: Multidetector CT imaging of the abdomen and pelvis was performed using the standard protocol following bolus administration of intravenous contrast. CONTRAST:  80 cc Isovue 300 COMPARISON:  None. FINDINGS: Mild motion degraded examination. LOWER CHEST: The heart is moderately enlarged. Pulmonary vascular congestion. No pericardial effusion. Mild gas distended distal esophagus associated with reflux. HEPATOBILIARY: Liver and gallbladder are normal. PANCREAS: Normal. SPLEEN: Normal. ADRENALS/URINARY TRACT: Kidneys are orthotopic, demonstrating symmetric enhancement. 2 cm RIGHT lower pole solid enhancing mass. No nephrolithiasis or hydronephrosis. Homogeneously hypodense 16 mm RIGHT upper pole cyst. Too small to characterize hypodensities bilateral kidneys. The unopacified ureters are normal in course and caliber. Delayed imaging through the kidneys demonstrates symmetric prompt contrast excretion within the proximal urinary collecting system. Urinary bladder is partially distended and unremarkable. Normal adrenal glands. STOMACH/BOWEL: The stomach, small and large bowel are normal in course and caliber without inflammatory changes. Enteric contrast has not yet reached the distal small bowel. Mild colonic diverticulosis. Appendix: Location: Retrocecal. Diameter: 14 mm Appendicolith: 5 mm appendicolith mid appendix. Mucosal hyper-enhancement: Yes. Extraluminal gas: No. Periappendiceal collection: Periappendiceal fat  stranding. No focal fluid collection. VASCULAR/LYMPHATIC: Aortoiliac vessels are normal in course and caliber. Moderate calcific atherosclerosis in intimal thickening. No lymphadenopathy by CT size criteria. REPRODUCTIVE: Mild prostatomegaly deforming base of bladder. OTHER: Trace free fluid in RIGHT pericolic gutter. MUSCULOSKELETAL: Nonacute. Severe L5-S1 degenerative disc with vacuum phenomena. IMPRESSION: 1. Acute appendicitis without perforation. 2. Mild colonic diverticulosis without acute diverticulitis. 3. **An incidental finding of potential clinical significance has been found. 2 cm RIGHT solid renal mass. Recommend renal protocol CT or MRI with and without contrast. This recommendation follows ACR consensus guidelines: Management of the Incidental Renal Mass on CT: A White Paper of the ACR Incidental Findings Committee. J Am Coll Radiol [phone].** 4. Acute findings discussed with and reconfirmed by Dr.DAVID GLICK on 86/08/6193 at 5:05 am. Aortic Atherosclerosis (ICD10-I70.0). Electronically Signed  By: Elon Alas M.D.   On: 12/08/2016 05:06    Review of Systems  Constitutional: Negative for chills, fever and weight loss.  Respiratory: Negative.   Cardiovascular: Negative.   Gastrointestinal: Positive for abdominal pain, diarrhea, nausea and vomiting. Negative for blood in stool and constipation.  Genitourinary: Negative.     Blood pressure 138/79, pulse 68, temperature 99.3 F (37.4 C), temperature source Oral, resp. rate (!) 27, SpO2 (!) 88 %. Physical Exam  General: Alert, slight Asian male, in no distress Skin: Warm and dry without rash or infection. HEENT: No palpable masses or thyromegaly. Sclera nonicteric. Pupils equal round and reactive.  Lymph nodes: No cervical, supraclavicular,  nodes palpable. Lungs: Breath sounds clear and equal without increased work of breathing Cardiovascular: Regular rate and rhythm without murmur. No JVD or edema. Peripheral pulses  intact. Abdomen: Mild distention.  Hypoactive bowel sounds.  There is localized right lower quadrant tenderness with guarding. No masses palpable. No organomegaly. No palpable hernias. Extremities: No edema or joint swelling or deformity. No chronic venous stasis changes. Neurologic: Alert and fully oriented.  Affect normal.  No gross motor deficits.  Assessment/Plan 24-hour history of frequent nausea and vomiting and right lower quadrant pain and tenderness.  CT scan shows acute appendicitis without complication.  He has slightly elevated creatinine and lactic acid likely secondary to dehydration.  He has received IV fluids and antibiotics in the emergency department.  Plan continued IV hydration and urgent laparoscopic appendectomy  There is a solid right renal mass that will require further workup after his acute illness is addressed.  Edward Jolly, MD 12/08/2016, 6:46 AM

## 2016-12-08 NOTE — Consult Note (Signed)
Cardiology Consultation:   Patient ID: Reginald Long; [id number]; Jul 24, 1940   Admit date: 12/08/2016 Date of Consult: 12/08/2016  Primary Care Provider: System, Provider Not In Primary Cardiologist: none, new patient   Patient Profile:   Reginald Long is a 76 y.o. male with a hx of HTN, acute appendicitis who is being seen today for the evaluation of new onset atrial fibrillation  at the request of Reginald Boston MD.  History of Present Illness:   Reginald Long is a very pleasant [place] male, with h/o HTN, HLP information obtained via grandson who serves as a Optometrist.  He has no prior cardiac history, no prior chest pain or SOB, or palpitations. He is fairly active. He underwent laparoscopic appendectomy, for an acute gangrenous appendicitis.  In the morning he had an ECG with normal SR, in the post op area he was noticed to be in atrial fibrillation, he is asymptomatic, denies any palpitations.   Past Medical History:  Diagnosis Date  . Gout   . Hyperlipidemia   . Hypertension     Past Surgical History:  Procedure Laterality Date  . right thumb surgery      Inpatient Medications: Scheduled Meds: . acetaminophen  1,000 mg Oral Q8H  . [START ON 12/09/2016] enoxaparin (LOVENOX) injection  40 mg Subcutaneous Q24H  . gabapentin  300 mg Oral QHS  . lip balm  1 application Topical BID   Continuous Infusions: . sodium chloride    . lactated ringers    . lactated ringers    . piperacillin-tazobactam (ZOSYN)  IV     PRN Meds: alum & mag hydroxide-simeth, bisacodyl, colchicine, diphenhydrAMINE **OR** diphenhydrAMINE, guaiFENesin-dextromethorphan, hydrALAZINE, hydrocortisone, hydrocortisone cream, HYDROmorphone (DILAUDID) injection, lactated ringers, lactated ringers, magic mouthwash, menthol-cetylpyridinium, methocarbamol, ondansetron **OR** ondansetron (ZOFRAN) IV, oxyCODONE, phenol, polyethylene glycol, prochlorperazine, simethicone  Allergies:   No Known Allergies  Social History:     Social History   Socioeconomic History  . Marital status: Married    Spouse name: Not on file  . Number of children: Not on file  . Years of education: Not on file  . Highest education level: Not on file  Social Needs  . Financial resource strain: Not on file  . Food insecurity - worry: Not on file  . Food insecurity - inability: Not on file  . Transportation needs - medical: Not on file  . Transportation needs - non-medical: Not on file  Occupational History  . Not on file  Tobacco Use  . Smoking status: Former Research scientist (life sciences)  . Smokeless tobacco: Never Used  Substance and Sexual Activity  . Alcohol use: No  . Drug use: No  . Sexual activity: Not on file  Other Topics Concern  . Not on file  Social History Narrative   Originally from [place]    Family History:   History reviewed. No pertinent family history. No FH of CAD or SCD.   ROS:  Please see the history of present illness.  ROS  All other ROS reviewed and negative.     Physical Exam/Data:   Vitals:   12/08/16 1415 12/08/16 1421 12/08/16 1436 12/08/16 1541  BP: 116/65 97/65 (!) 144/71 134/83  Pulse: 71 92 80 63  Resp: (!) 21 20 20 18   Temp:  (!) 97.5 F (36.4 C) 98 F (36.7 C) 97.6 F (36.4 C)  TempSrc:    Oral  SpO2: 97% 98% 100% 98%    Intake/Output Summary (Last 24 hours) at 12/08/2016 1605 Last data filed at 12/08/2016 1354  Gross per 24 hour  Intake 2600 ml  Output 310 ml  Net 2290 ml   There were no vitals filed for this visit. There is no height or weight on file to calculate BMI.  General:  Well nourished, well developed, in no acute distress HEENT: normal Lymph: no adenopathy Neck: no JVD Endocrine:  No thryomegaly Vascular: No carotid bruits; FA pulses 2+ bilaterally without bruits  Cardiac:  normal S1, S2; RRR; no murmur Lungs:  clear to auscultation bilaterally, no wheezing, rhonchi or rales  Abd: soft, nontender, no hepatomegaly  Ext: no edema Musculoskeletal:  No deformities, BUE and  BLE strength normal and equal Skin: warm and dry  Neuro:  CNs 2-12 intact, no focal abnormalities noted Psych:  Normal affect   EKG:  The EKG was personally reviewed and demonstrates:  Atrial fibrillation, rate controlled Telemetry:  Telemetry was personally reviewed and demonstrates:  Atrial fibrillation  Relevant CV Studies: TTE is pending  Laboratory Data:  Chemistry Recent Labs  Lab 12/08/16 0311  NA 140  K 3.6  CL 106  CO2 18*  GLUCOSE 182*  BUN 22*  CREATININE 1.45*  CALCIUM 9.4  GFRNONAA 46*  GFRAA 53*  ANIONGAP 16*    Recent Labs  Lab 12/08/16 0311  PROT 7.2  ALBUMIN 3.9  AST 38  ALT 36  ALKPHOS 87  BILITOT 1.5*   Hematology Recent Labs  Lab 12/08/16 0311  WBC 7.3  RBC 5.18  HGB 16.3  HCT 46.0  MCV 88.8  MCH 31.5  MCHC 35.4  RDW 12.8  PLT 138*   Cardiac EnzymesNo results for input(s): TROPONINI in the last 168 hours. No results for input(s): TROPIPOC in the last 168 hours.  BNPNo results for input(s): BNP, PROBNP in the last 168 hours.  DDimer No results for input(s): DDIMER in the last 168 hours.  Radiology/Studies:  Dg Chest 2 View  Result Date: 12/08/2016 CLINICAL DATA:  Fever and hypertension.  Nonsmoker. EXAM: CHEST  2 VIEW COMPARISON:  None. FINDINGS: Elevation of the right hemidiaphragm. Cardiac enlargement without vascular congestion or edema. No consolidation or airspace disease in the lungs. No blunting of costophrenic angles. No pneumothorax. Mediastinal contours appear intact. IMPRESSION: Cardiac enlargement.  No evidence of active pulmonary disease. Electronically Signed   By: Lucienne Capers M.D.   On: 12/08/2016 05:05   Ct Abdomen Pelvis W Contrast  Result Date: 12/08/2016 CLINICAL DATA:  Nausea, vomiting and diarrhea for 1 day. Poor intake. Assess abdominal pain, suspect diverticulitis. History of hypertension. EXAM: CT ABDOMEN AND PELVIS WITH CONTRAST TECHNIQUE: Multidetector CT imaging of the abdomen and pelvis was performed  using the standard protocol following bolus administration of intravenous contrast. CONTRAST:  80 cc Isovue 300 COMPARISON:  None. FINDINGS: Mild motion degraded examination. LOWER CHEST: The heart is moderately enlarged. Pulmonary vascular congestion. No pericardial effusion. Mild gas distended distal esophagus associated with reflux. HEPATOBILIARY: Liver and gallbladder are normal. PANCREAS: Normal. SPLEEN: Normal. ADRENALS/URINARY TRACT: Kidneys are orthotopic, demonstrating symmetric enhancement. 2 cm RIGHT lower pole solid enhancing mass. No nephrolithiasis or hydronephrosis. Homogeneously hypodense 16 mm RIGHT upper pole cyst. Too small to characterize hypodensities bilateral kidneys. The unopacified ureters are normal in course and caliber. Delayed imaging through the kidneys demonstrates symmetric prompt contrast excretion within the proximal urinary collecting system. Urinary bladder is partially distended and unremarkable. Normal adrenal glands. STOMACH/BOWEL: The stomach, small and large bowel are normal in course and caliber without inflammatory changes. Enteric contrast has not yet reached the distal  small bowel. Mild colonic diverticulosis. Appendix: Location: Retrocecal. Diameter: 14 mm Appendicolith: 5 mm appendicolith mid appendix. Mucosal hyper-enhancement: Yes. Extraluminal gas: No. Periappendiceal collection: Periappendiceal fat stranding. No focal fluid collection. VASCULAR/LYMPHATIC: Aortoiliac vessels are normal in course and caliber. Moderate calcific atherosclerosis in intimal thickening. No lymphadenopathy by CT size criteria. REPRODUCTIVE: Mild prostatomegaly deforming base of bladder. OTHER: Trace free fluid in RIGHT pericolic gutter. MUSCULOSKELETAL: Nonacute. Severe L5-S1 degenerative disc with vacuum phenomena. IMPRESSION: 1. Acute appendicitis without perforation. 2. Mild colonic diverticulosis without acute diverticulitis. 3. **An incidental finding of potential clinical significance  has been found. 2 cm RIGHT solid renal mass. Recommend renal protocol CT or MRI with and without contrast. This recommendation follows ACR consensus guidelines: Management of the Incidental Renal Mass on CT: A White Paper of the ACR Incidental Findings Committee. J Am Coll Radiol [phone].** 4. Acute findings discussed with and reconfirmed by Dr.DAVID GLICK on 06/05/9765 at 5:05 am. Aortic Atherosclerosis (ICD10-I70.0). Electronically Signed   By: Elon Alas M.D.   On: 12/08/2016 05:06    Assessment and Plan:   1. New onset atrial fibrillation - less than 49 hours, no anticoagulation needed, most probably triggered by an acute infection, I anticipate resolution before discharge. Start Cardizem drip 5 mg/hr. 2. Obtain echocardiogram. 3. Hypertension - might be sec to pain, only Cardizem for now   For questions or updates, please contact [place] Please consult [email or web address] for contact info under Cardiology/STEMI.   Signed, Ena Dawley, MD  12/08/2016 4:05 PM

## 2016-12-08 NOTE — ED Notes (Signed)
Abnormal lab result MD Roxanne Mins have been made aware

## 2016-12-08 NOTE — ED Notes (Signed)
Pt aware that urine sample is needed.  

## 2016-12-08 NOTE — ED Triage Notes (Signed)
Pt coming from home with complaints of flu like symptoms. N/V and diarrhea x 1 day. No emesis today. Pt has had poor food and fluid intake. Pt reports feeling progressively worse. Hx of hypertension. Pt received 1000mg  Tylenol PO with EMS, has had flu shot recently, and denies being around anyone sick.   165/97

## 2016-12-08 NOTE — Progress Notes (Signed)
I have re-reviewed the the patient's records, history, medications, and allergies.  I have re-examined the patient.  I again discussed intraoperative plans and goals of post-operative recovery.  D/w patient with anesthesia & preop RN with phone interpreter.  Questions answered.  The patient is very appreciative & agrees to proceed with lap appendectomy  Reginald Long  [birth year] [id number]  Patient Care Team: System, Provider Not In as PCP - General  Patient Active Problem List   Diagnosis Date Noted  . Acute appendicitis 12/08/2016  . Non-English speaking patient 12/08/2016  . Viral enteritis 12/04/2015  . Hyperlipidemia 12/04/2015  . Essential hypertension 12/04/2015  . Right foot pain 12/04/2015  . Acute gout of right ankle 12/04/2015  . Melanoma of skin, site unspecified 10/26/2013    Past Medical History:  Diagnosis Date  . Gout   . Hyperlipidemia   . Hypertension     Past Surgical History:  Procedure Laterality Date  . right thumb surgery      Social History   Socioeconomic History  . Marital status: Married    Spouse name: Not on file  . Number of children: Not on file  . Years of education: Not on file  . Highest education level: Not on file  Social Needs  . Financial resource strain: Not on file  . Food insecurity - worry: Not on file  . Food insecurity - inability: Not on file  . Transportation needs - medical: Not on file  . Transportation needs - non-medical: Not on file  Occupational History  . Not on file  Tobacco Use  . Smoking status: Former Research scientist (life sciences)  . Smokeless tobacco: Never Used  Substance and Sexual Activity  . Alcohol use: No  . Drug use: No  . Sexual activity: Not on file  Other Topics Concern  . Not on file  Social History Narrative  . Not on file    History reviewed. No pertinent family history.  Medications Prior to Admission  Medication Sig Dispense Refill Last Dose  . colchicine 0.6 MG tablet Take two tablets at onset of flare up  then 0.65m one hour later. (Patient taking differently: Take 0.6 mg 2 (two) times daily as needed by mouth (Gout). ) 15 tablet 2 Past Week at Unknown time  . lisinopril (PRINIVIL,ZESTRIL) 20 MG tablet Take 1 tablet (20 mg total) by mouth daily. 90 tablet 1 Past Week at Unknown time  . amLODipine (NORVASC) 2.5 MG tablet Take 1 tablet (2.5 mg total) by mouth daily. (Patient not taking: Reported on 12/08/2016) 90 tablet 0 Not Taking at Unknown time  . diclofenac sodium (VOLTAREN) 1 % GEL Apply 4 g topically 4 (four) times daily. (Patient not taking: Reported on 12/08/2016) 100 g 2 Not Taking at Unknown time    Current Facility-Administered Medications  Medication Dose Route Frequency Provider Last Rate Last Dose  . acetaminophen (TYLENOL) tablet 1,000 mg  1,000 mg Oral On Call to OR GMichael Boston MD      . cefTRIAXone (ROCEPHIN) 2 g in dextrose 5 % 50 mL IVPB  2 g Intravenous On Call to OR GMichael Boston MD       And  . metroNIDAZOLE (FLAGYL) IVPB 500 mg  500 mg Intravenous On Call to OR GMichael Boston MD      . Chlorhexidine Gluconate Cloth 2 % PADS 6 each  6 each Topical Once GMichael Boston MD       And  . Chlorhexidine Gluconate Cloth 2 % PADS 6 each  6 each Topical Once [name], MD      . gabapentin (NEURONTIN) capsule 300 mg  300 mg Oral On Call to OR [name], MD      . lactated ringers infusion   Intravenous Continuous Excell Seltzer, MD      . morphine 2 MG/ML injection 2 mg  2 mg Intravenous Q2H PRN Excell Seltzer, MD      . ondansetron (ZOFRAN-ODT) disintegrating tablet 4 mg  4 mg Oral Q6H PRN Excell Seltzer, MD       Or  . ondansetron (ZOFRAN) injection 4 mg  4 mg Intravenous Q6H PRN Excell Seltzer, MD         No Known Allergies  BP 133/70   Pulse 72   Temp 99.3 F (37.4 C) (Oral)   Resp (!) 30   SpO2 (!) 88%   General: Pt awake/alert/oriented x4 in no major acute distress Eyes: PERRL, normal EOM. Sclera nonicteric Neuro: CN II-XII intact w/o focal  sensory/motor deficits. Lymph: No head/neck/groin lymphadenopathy Psych:  No delerium/psychosis/paranoia HENT: Normocephalic, Mucus membranes moist.  No thrush Neck: Supple, No tracheal deviation Chest: No pain.  Good respiratory excursion. CV:  Pulses intact.  Regular rhythm MS: Normal AROM mjr joints.  No obvious deformity Abdomen: Soft, Mildly distended. TTP RLQ.  No incarcerated hernias. Ext:  SCDs BLE.  No significant edema.  No cyanosis.  Missing right thumb tip/nail Skin: No petechiae / purpura   Labs: Results for orders placed or performed during the hospital encounter of 12/08/16 (from the past 48 hour(s))  CBC with Differential     Status: Abnormal   Collection Time: 12/08/16  3:11 AM  Result Value Ref Range   WBC 7.3 4.0 - 10.5 K/uL   RBC 5.18 4.22 - 5.81 MIL/uL   Hemoglobin 16.3 13.0 - 17.0 g/dL   HCT 46.0 39.0 - 52.0 %   MCV 88.8 78.0 - 100.0 fL   MCH 31.5 26.0 - 34.0 pg   MCHC 35.4 30.0 - 36.0 g/dL   RDW 12.8 11.5 - 15.5 %   Platelets 138 (L) 150 - 400 K/uL   Neutrophils Relative % 92 %   Neutro Abs 6.7 1.7 - 7.7 K/uL   Lymphocytes Relative 7 %   Lymphs Abs 0.5 (L) 0.7 - 4.0 K/uL   Monocytes Relative 1 %   Monocytes Absolute 0.1 0.1 - 1.0 K/uL   Eosinophils Relative 0 %   Eosinophils Absolute 0.0 0.0 - 0.7 K/uL   Basophils Relative 0 %   Basophils Absolute 0.0 0.0 - 0.1 K/uL  Basic metabolic panel     Status: Abnormal   Collection Time: 12/08/16  3:11 AM  Result Value Ref Range   Sodium 140 135 - 145 mmol/L   Potassium 3.6 3.5 - 5.1 mmol/L   Chloride 106 101 - 111 mmol/L   CO2 18 (L) 22 - 32 mmol/L   Glucose, Bld 182 (H) 65 - 99 mg/dL   BUN 22 (H) 6 - 20 mg/dL   Creatinine, Ser 1.45 (H) 0.61 - 1.24 mg/dL   Calcium 9.4 8.9 - 10.3 mg/dL   GFR calc non Af Amer 46 (L) >60 mL/min   GFR calc Af Amer 53 (L) >60 mL/min    Comment: (NOTE) The eGFR has been calculated using the CKD EPI equation. This calculation has not been validated in all clinical  situations. eGFR's persistently <60 mL/min signify possible Chronic Kidney Disease.    Anion gap 16 (H) 5 - 15  Lipase, blood     Status: None   Collection Time: 12/08/16  3:11 AM  Result Value Ref Range   Lipase 25 11 - 51 U/L  Hepatic function panel     Status: Abnormal   Collection Time: 12/08/16  3:11 AM  Result Value Ref Range   Total Protein 7.2 6.5 - 8.1 g/dL   Albumin 3.9 3.5 - 5.0 g/dL   AST 38 15 - 41 U/L   ALT 36 17 - 63 U/L   Alkaline Phosphatase 87 38 - 126 U/L   Total Bilirubin 1.5 (H) 0.3 - 1.2 mg/dL   Bilirubin, Direct 0.4 0.1 - 0.5 mg/dL   Indirect Bilirubin 1.1 (H) 0.3 - 0.9 mg/dL  I-Stat CG4 Lactic Acid, ED     Status: Abnormal   Collection Time: 12/08/16  3:31 AM  Result Value Ref Range   Lactic Acid, Venous 5.49 (HH) 0.5 - 1.9 mmol/L   Comment NOTIFIED PHYSICIAN   I-Stat CG4 Lactic Acid, ED     Status: Abnormal   Collection Time: 12/08/16  5:16 AM  Result Value Ref Range   Lactic Acid, Venous 3.14 (HH) 0.5 - 1.9 mmol/L   Comment NOTIFIED PHYSICIAN   Urine rapid drug screen (hosp performed)     Status: Abnormal   Collection Time: 12/08/16  9:34 AM  Result Value Ref Range   Opiates POSITIVE (A) NONE DETECTED   Cocaine NONE DETECTED NONE DETECTED   Benzodiazepines NONE DETECTED NONE DETECTED   Amphetamines NONE DETECTED NONE DETECTED   Tetrahydrocannabinol NONE DETECTED NONE DETECTED   Barbiturates NONE DETECTED NONE DETECTED    Comment:        DRUG SCREEN FOR MEDICAL PURPOSES ONLY.  IF CONFIRMATION IS NEEDED FOR ANY PURPOSE, NOTIFY LAB WITHIN 5 DAYS.        LOWEST DETECTABLE LIMITS FOR URINE DRUG SCREEN Drug Class       Cutoff (ng/mL) Amphetamine      1000 Barbiturate      200 Benzodiazepine   450 Tricyclics       388 Opiates          300 Cocaine          300 THC              50     Imaging / Studies: Dg Chest 2 View  Result Date: 12/08/2016 CLINICAL DATA:  Fever and hypertension.  Nonsmoker. EXAM: CHEST  2 VIEW COMPARISON:  None.  FINDINGS: Elevation of the right hemidiaphragm. Cardiac enlargement without vascular congestion or edema. No consolidation or airspace disease in the lungs. No blunting of costophrenic angles. No pneumothorax. Mediastinal contours appear intact. IMPRESSION: Cardiac enlargement.  No evidence of active pulmonary disease. Electronically Signed   By: Lucienne Capers M.D.   On: 12/08/2016 05:05   Ct Abdomen Pelvis W Contrast  Result Date: 12/08/2016 CLINICAL DATA:  Nausea, vomiting and diarrhea for 1 day. Poor intake. Assess abdominal pain, suspect diverticulitis. History of hypertension. EXAM: CT ABDOMEN AND PELVIS WITH CONTRAST TECHNIQUE: Multidetector CT imaging of the abdomen and pelvis was performed using the standard protocol following bolus administration of intravenous contrast. CONTRAST:  80 cc Isovue 300 COMPARISON:  None. FINDINGS: Mild motion degraded examination. LOWER CHEST: The heart is moderately enlarged. Pulmonary vascular congestion. No pericardial effusion. Mild gas distended distal esophagus associated with reflux. HEPATOBILIARY: Liver and gallbladder are normal. PANCREAS: Normal. SPLEEN: Normal. ADRENALS/URINARY TRACT: Kidneys are orthotopic, demonstrating symmetric enhancement. 2 cm RIGHT lower pole solid enhancing mass.  No nephrolithiasis or hydronephrosis. Homogeneously hypodense 16 mm RIGHT upper pole cyst. Too small to characterize hypodensities bilateral kidneys. The unopacified ureters are normal in course and caliber. Delayed imaging through the kidneys demonstrates symmetric prompt contrast excretion within the proximal urinary collecting system. Urinary bladder is partially distended and unremarkable. Normal adrenal glands. STOMACH/BOWEL: The stomach, small and large bowel are normal in course and caliber without inflammatory changes. Enteric contrast has not yet reached the distal small bowel. Mild colonic diverticulosis. Appendix: Location: Retrocecal. Diameter: 14 mm Appendicolith:  5 mm appendicolith mid appendix. Mucosal hyper-enhancement: Yes. Extraluminal gas: No. Periappendiceal collection: Periappendiceal fat stranding. No focal fluid collection. VASCULAR/LYMPHATIC: Aortoiliac vessels are normal in course and caliber. Moderate calcific atherosclerosis in intimal thickening. No lymphadenopathy by CT size criteria. REPRODUCTIVE: Mild prostatomegaly deforming base of bladder. OTHER: Trace free fluid in RIGHT pericolic gutter. MUSCULOSKELETAL: Nonacute. Severe L5-S1 degenerative disc with vacuum phenomena. IMPRESSION: 1. Acute appendicitis without perforation. 2. Mild colonic diverticulosis without acute diverticulitis. 3. **An incidental finding of potential clinical significance has been found. 2 cm RIGHT solid renal mass. Recommend renal protocol CT or MRI with and without contrast. This recommendation follows ACR consensus guidelines: Management of the Incidental Renal Mass on CT: A White Paper of the ACR Incidental Findings Committee. J Am Coll Radiol [phone].** 4. Acute findings discussed with and reconfirmed by Dr.DAVID GLICK on 86/02/6835 at 5:05 am. Aortic Atherosclerosis (ICD10-I70.0). Electronically Signed   By: Elon Alas M.D.   On: 12/08/2016 05:06     .Adin Hector, M.D., F.A.C.S. Gastrointestinal and Minimally Invasive Surgery [facility] [address], [place], [place] [address] [phone] Main / Paging  12/08/2016 10:30 AM

## 2016-12-08 NOTE — Anesthesia Procedure Notes (Signed)
Procedure Name: Intubation Date/Time: 12/08/2016 11:22 AM Performed by: Deliah Boston, CRNA Pre-anesthesia Checklist: Patient identified, Emergency Drugs available, Suction available and Patient being monitored Patient Re-evaluated:Patient Re-evaluated prior to induction Oxygen Delivery Method: Circle system utilized Preoxygenation: Pre-oxygenation with 100% oxygen Induction Type: IV induction Ventilation: Mask ventilation without difficulty Laryngoscope Size: Mac and 4 Grade View: Grade I Tube type: Oral Tube size: 7.5 mm Number of attempts: 1 Airway Equipment and Method: Stylet and Oral airway Placement Confirmation: ETT inserted through vocal cords under direct vision,  positive ETCO2 and breath sounds checked- equal and bilateral Secured at: 21 cm Tube secured with: Tape Dental Injury: Teeth and Oropharynx as per pre-operative assessment

## 2016-12-08 NOTE — Op Note (Signed)
PATIENT:  Reginald Long  76 y.o. male  Patient Care Team: System, Provider Not In as PCP - General  PRE-OPERATIVE DIAGNOSIS:  appendicitis  POST-OPERATIVE DIAGNOSIS:  Acute gangrenous appendicitis  PROCEDURE:  APPENDECTOMY LAPAROSCOPIC  SURGEON:  Adin Hector, MD  ANESTHESIA:   local and general  EBL:  Total I/O In: 2000 [I.V.:1000; IV Piggyback:1000] Out: 310 [Urine:300; Blood:10]  Delay start of Pharmacological VTE agent (>24hrs) due to surgical blood loss or risk of bleeding:  no  DRAINS: none   SPECIMEN:  APPENDIX  DISPOSITION OF SPECIMEN:  PATHOLOGY  COUNTS:  YES  PLAN OF CARE: Admit to inpatient   PATIENT DISPOSITION:  PACU - hemodynamically stable.   INDICATIONS: Patient with concerning symptoms & work up suspicious for appendicitis.  Surgery was recommended:  The anatomy & physiology of the digestive tract was discussed.  The pathophysiology of appendicitis was discussed.  Natural history risks without surgery was discussed.   I feel the risks of no intervention will lead to serious problems that outweigh the operative risks; therefore, I recommended diagnostic laparoscopy with removal of appendix to remove the pathology.  Laparoscopic & open techniques were discussed.   I noted a good likelihood this will help address the problem.    Risks such as bleeding, infection, abscess, leak, reoperation, possible ostomy, hernia, heart attack, death, and other risks were discussed.  Goals of post-operative recovery were discussed as well.  We will work to minimize complications.  Questions were answered.  The patient expresses understanding & wishes to proceed with surgery.  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  Some phlegmon and focal peritonitis.  No discrete abscess though.  DESCRIPTION:   The patient was identified & brought into the operating room. The patient was positioned supine with arms tucked. SCDs were active during the entire case.  The patient underwent general anesthesia without any difficulty.  The abdomen was prepped and draped in a sterile fashion. A Surgical Timeout confirmed our plan.  I made a transverse incision through the superior umbilical fold.  I made a small transverse nick through the infraumbilical fascia and confirmed peritoneal entry.  I placed a 9mm port.  We induced carbon dioxide insufflation.  Camera inspection revealed no injury.  I placed additional ports under direct laparoscopic visualization.  I mobilized the terminal ileum to proximal ascending colon in a lateral to medial fashion.  I took care to avoid injuring any retroperitoneal structures.  The appendix was slightly retrocolic and gangrenous.  Gangrene involving most of the appendix going very close to the base.  I freed the appendix off its attachments to the ascending colon and cecal mesentery.  I elevated the appendix. I skeletonized the mesoappendix. I was able to free off the base of the appendix which was still viable.  I stapled the appendix off the cecum using a laparoscopic stapler. I ligated the mesoappendix and assured hemostasis in the mesentery.  I placed the appendix inside an EcoSac bag and removed out the 12 mm port.  I did copious irrigation. Hemostasis was good in the mesoappendix, colon mesentery, and retroperitoneum. Staple line was intact on the cecum but was of fair quality.  I laparoscopically patched over the staple line with 2-0 Vicryl interrupted sutures using some ileocecal mesentery and epiploic appendages.  That help patch and protect the staple line almost like an OMENTOPEXY.  Brought omentum down around it as well.  I did reinspection.  Saw no bleeding.  I  removed the appendix inside the bag.  I washed out the pelvis, retrohepatic space and right paracolic gutter. I washed out the left side as well.  Hemostasis is good. There was no perforation or injury. Because the area cleaned up well after irrigation, I did not place a  drain.   I aspirated the carbon dioxide. I removed the ports.   I closed the 12 mm stapler port site with a 0 Vicryl stitch interrupted.  I closed skin using 4-0 monocryl stitch.  Sterile dressings applied.  Patient was extubated and sent to the recovery room. I suspect the patient is going used in the hospital at least overnight and will need antibiotics for 7 days.  I discussed operative findings, updated the patient's status, discussed probable steps to recovery, and gave postoperative recommendations to the patient's family.  Recommendations were made.  Questions were answered.  They expressed understanding & appreciation.   Adin Hector, M.D., F.A.C.S. Gastrointestinal and Minimally Invasive Surgery [facility] [address] [place], [place] [address] [phone] Main / Paging  12/08/2016 12:44 PM

## 2016-12-08 NOTE — ED Notes (Signed)
Bed: UG64 Expected date:  Expected time:  Means of arrival:  Comments: 76 yo M/Fever

## 2016-12-08 NOTE — ED Provider Notes (Signed)
[place] [facility] DEPT Provider Note   CSN: [id number] Arrival date & time: 12/08/16  0255     History   Chief Complaint Chief Complaint  Patient presents with  . Emesis  . Flu like symptoms    HPI Reginald Long is a 76 y.o. male.  The history is provided by the patient. A language interpreter was used.  He had onset yesterday morning of chills and abdominal pain and nausea and vomiting.  Abdominal pain is periumbilical with some radiation to the back.  He did not check his temperature at home.  He has not had any sweating.  He denies any cough or dyspnea.  There is no chest pain.  He denies any urinary symptoms.  He denies any sick contacts.  Symptoms have been getting worse, so he came to the ED.  There was no treatment given at home.  Past Medical History:  Diagnosis Date  . Gout   . Hyperlipidemia   . Hypertension     Patient Active Problem List   Diagnosis Date Noted  . Viral enteritis 12/04/2015  . Hyperlipidemia 12/04/2015  . Essential hypertension 12/04/2015  . Right foot pain 12/04/2015  . Acute gout of right ankle 12/04/2015  . Melanoma of skin, site unspecified 10/26/2013    History reviewed. No pertinent surgical history.     Home Medications    Prior to Admission medications   Medication Sig Start Date End Date Taking? Authorizing Provider  amLODipine (NORVASC) 2.5 MG tablet Take 1 tablet (2.5 mg total) by mouth daily. 06/16/16   Tenna Delaine D, PA-C  colchicine 0.6 MG tablet Take two tablets at onset of flare up then 0.6mg  one hour later. 06/16/16   Tenna Delaine D, PA-C  diclofenac sodium (VOLTAREN) 1 % GEL Apply 4 g topically 4 (four) times daily. 03/18/16   Tenna Delaine D, PA-C  lisinopril (PRINIVIL,ZESTRIL) 20 MG tablet Take 1 tablet (20 mg total) by mouth daily. 06/16/16   Tenna Delaine D, PA-C  lovastatin (MEVACOR) 20 MG tablet Take 20 mg by mouth at bedtime.    [provider]    Family History No  family history on file.  Social History Social History   Tobacco Use  . Smoking status: Former Research scientist (life sciences)  . Smokeless tobacco: Never Used  Substance Use Topics  . Alcohol use: No  . Drug use: No     Allergies   Patient has no known allergies.   Review of Systems Review of Systems  All other systems reviewed and are negative.    Physical Exam Updated Vital Signs BP 132/69   Pulse 71   Temp 99.3 F (37.4 C) (Oral)   Resp 15   SpO2 (!) 88%   Physical Exam  Nursing note and vitals reviewed.  76 year old male, resting comfortably and in no acute distress. Vital signs are normal. Oxygen saturation is 88%, which is hypoxic. Head is normocephalic and atraumatic. PERRLA, EOMI. Oropharynx is clear. Neck is nontender and supple without adenopathy or JVD. Back is nontender and there is no CVA tenderness. Lungs have faint rales at the right base without wheezes or rhonchi. Chest is nontender. Heart has regular rate and rhythm with 0-2/4 harsh systolic murmur best heard at the upper left sternal border. Abdomen is soft, flat, nontender without masses or hepatosplenomegaly and peristalsis is hypoactive. Extremities have no cyanosis or edema, full range of motion is present. Skin is warm and dry without rash. Neurologic: Mental status is normal, cranial  nerves are intact, there are no motor or sensory deficits.  ED Treatments / Results  Labs (all labs ordered are listed, but only abnormal results are displayed) Labs Reviewed  CBC WITH DIFFERENTIAL/PLATELET - Abnormal; Notable for the following components:      Result Value   Platelets 138 (*)    Lymphs Abs 0.5 (*)    All other components within normal limits  BASIC METABOLIC PANEL - Abnormal; Notable for the following components:   CO2 18 (*)    Glucose, Bld 182 (*)    BUN 22 (*)    Creatinine, Ser 1.45 (*)    GFR calc non Af Amer 46 (*)    GFR calc Af Amer 53 (*)    Anion gap 16 (*)    All other components within normal  limits  I-STAT CG4 LACTIC ACID, ED - Abnormal; Notable for the following components:   Lactic Acid, Venous 5.49 (*)    All other components within normal limits  I-STAT CG4 LACTIC ACID, ED - Abnormal; Notable for the following components:   Lactic Acid, Venous 3.14 (*)    All other components within normal limits  CULTURE, BLOOD (ROUTINE X 2)  CULTURE, BLOOD (ROUTINE X 2)  LIPASE, BLOOD  RAPID URINE DRUG SCREEN, HOSP PERFORMED  HEPATIC FUNCTION PANEL    EKG  EKG Interpretation  Date/Time:  Monday December 08 2016 03:38:33 EST Ventricular Rate:  68 PR Interval:    QRS Duration: 103 QT Interval:  539 QTC Calculation: 574 R Axis:   27 Text Interpretation:  Sinus rhythm Probable left atrial enlargement Minimal ST elevation, anterior leads Prolonged QT interval Baseline wander in lead(s) I III aVL No old tracing to compare Confirmed by Delora Fuel ([id number]) on 12/08/2016 4:16:32 AM       Radiology Dg Chest 2 View  Result Date: 12/08/2016 CLINICAL DATA:  Fever and hypertension.  Nonsmoker. EXAM: CHEST  2 VIEW COMPARISON:  None. FINDINGS: Elevation of the right hemidiaphragm. Cardiac enlargement without vascular congestion or edema. No consolidation or airspace disease in the lungs. No blunting of costophrenic angles. No pneumothorax. Mediastinal contours appear intact. IMPRESSION: Cardiac enlargement.  No evidence of active pulmonary disease. Electronically Signed   By: Lucienne Capers M.D.   On: 12/08/2016 05:05   Ct Abdomen Pelvis W Contrast  Result Date: 12/08/2016 CLINICAL DATA:  Nausea, vomiting and diarrhea for 1 day. Poor intake. Assess abdominal pain, suspect diverticulitis. History of hypertension. EXAM: CT ABDOMEN AND PELVIS WITH CONTRAST TECHNIQUE: Multidetector CT imaging of the abdomen and pelvis was performed using the standard protocol following bolus administration of intravenous contrast. CONTRAST:  80 cc Isovue 300 COMPARISON:  None. FINDINGS: Mild motion degraded  examination. LOWER CHEST: The heart is moderately enlarged. Pulmonary vascular congestion. No pericardial effusion. Mild gas distended distal esophagus associated with reflux. HEPATOBILIARY: Liver and gallbladder are normal. PANCREAS: Normal. SPLEEN: Normal. ADRENALS/URINARY TRACT: Kidneys are orthotopic, demonstrating symmetric enhancement. 2 cm RIGHT lower pole solid enhancing mass. No nephrolithiasis or hydronephrosis. Homogeneously hypodense 16 mm RIGHT upper pole cyst. Too small to characterize hypodensities bilateral kidneys. The unopacified ureters are normal in course and caliber. Delayed imaging through the kidneys demonstrates symmetric prompt contrast excretion within the proximal urinary collecting system. Urinary bladder is partially distended and unremarkable. Normal adrenal glands. STOMACH/BOWEL: The stomach, small and large bowel are normal in course and caliber without inflammatory changes. Enteric contrast has not yet reached the distal small bowel. Mild colonic diverticulosis. Appendix: Location: Retrocecal. Diameter: 14 mm  Appendicolith: 5 mm appendicolith mid appendix. Mucosal hyper-enhancement: Yes. Extraluminal gas: No. Periappendiceal collection: Periappendiceal fat stranding. No focal fluid collection. VASCULAR/LYMPHATIC: Aortoiliac vessels are normal in course and caliber. Moderate calcific atherosclerosis in intimal thickening. No lymphadenopathy by CT size criteria. REPRODUCTIVE: Mild prostatomegaly deforming base of bladder. OTHER: Trace free fluid in RIGHT pericolic gutter. MUSCULOSKELETAL: Nonacute. Severe L5-S1 degenerative disc with vacuum phenomena. IMPRESSION: 1. Acute appendicitis without perforation. 2. Mild colonic diverticulosis without acute diverticulitis. 3. **An incidental finding of potential clinical significance has been found. 2 cm RIGHT solid renal mass. Recommend renal protocol CT or MRI with and without contrast. This recommendation follows ACR consensus guidelines:  Management of the Incidental Renal Mass on CT: A White Paper of the ACR Incidental Findings Committee. J Am Coll Radiol [phone].** 4. Acute findings discussed with and reconfirmed by Dr.Karlin Heilman on 19/04/7900 at 5:05 am. Aortic Atherosclerosis (ICD10-I70.0). Electronically Signed   By: Elon Alas M.D.   On: 12/08/2016 05:06    Procedures Procedures (including critical care time)  Medications Ordered in ED Medications  sodium chloride 0.9 % bolus 1,000 mL (not administered)  morphine 4 MG/ML injection 4 mg (not administered)  ondansetron (ZOFRAN) injection 4 mg (not administered)  sodium chloride 0.9 % bolus 2,000 mL (2,000 mLs Intravenous New Bag/Given 12/08/16 0334)     Initial Impression / Assessment and Plan / ED Course  I have reviewed the triage vital signs and the nursing notes.  Pertinent labs & imaging results that were available during my care of the patient were reviewed by me and considered in my medical decision making (see chart for details).  Subjective fever and chills with associated abdominal pain, nausea, vomiting.  Exam is benign except for faint crackles at the right base.  Labs have been ordered prior to my seeing the patient, and he has a normal WBC, but with a left shift.  Mild renal insufficiency is present and which is slightly worse than baseline.  Lactic acid level is elevated.  It is not clear at this point that this is sepsis.  He is afebrile here and with normal heart rate and respiratory rate.  Chest x-ray will be obtained as well CT of abdomen and pelvis.  In the meantime, he is started on IV fluids.  Will hold on giving antibiotics until there is clear evidence that there is an actual infectious process.  CT scan shows acute appendicitis which is uncomplicated.  Lactic acid level has come down, but not to normal.  He is started on antibiotics of ceftriaxone and metronidazole.  Case is discussed with Dr. Excell Seltzer of [facility], who  agrees to admit the patient for operative management of appendicitis.  Of note, CT also shows evidence of a right renal mass that will need follow-up either with renal protocol CT scan or with MRI scan.  Final Clinical Impressions(s) / ED Diagnoses   Final diagnoses:  Acute appendicitis, unspecified acute appendicitis type  Renal insufficiency  Elevated lactic acid level  Renal mass, right    ED Discharge Orders    None       Delora Fuel, MD 40/97/35 [phone]

## 2016-12-08 NOTE — Discharge Instructions (Signed)
Vim ru?t th?a (Appendicitis) Ru?t th?a l m?t ?ng hnh ngn tay g?n v?i ru?t gi. Vim ru?t th?a l vim ph?n ru?t th?a ?. N?u khng ???c ?i?u tr?, vim ru?t th?a c th? lm cho ru?t th?a b? rch (v?). Ru?t th?a b? v? c th? d?n ??n nhi?m trng ?e d?a tnh m?ng. B?nh c?ng d?n ??n tnh tr?ng m?ng m? gy ?au (p xe) trong ru?t th?a. NGUYN NHN Tnh tr?ng ny c th? do t?c ru?t th?a d?n ??n nhi?m trng. T?c ru?t c th? do:  M?t c?c phn.  Cc h?ch b?ch huy?t to ra. Trong m?t s? tr??ng h?p, khng r nguyn nhn. CC Y?U T? NGUY C? Tnh tr?ng ny hay x?y ra h?n ? nh?ng ng??i t? 10-30 tu?i: TRI?U CH?NG Nh?ng tri?u ch?ng c?a tnh tr?ng ny bao g?m:  ?au quanh r?n v lan sang ph?n b?ng d??i bn ph?i. C?n ?au c th? cng lc cng n?ng h?n. C?n ?au n?ng h?n khi ho ho?c c? ??ng b?t ng?.  Nh?y c?m ?au ? vng b?ng d??i bn ph?i.  Bu?n nn.  Nn.  ?n khng ngon mi?ng.  S?t.  To bn.  Tiu ch?y.  Nhn chung c?m th?y khng kh?e. CH?N ?ON Tnh tr?ng ny c th? ???c ch?n ?on b?ng:  Khm th?c th?.  Xt nghi?m mu.  Xt nghi?m n??c ti?u. ?? xc ??nh ch?n ?on c th? c?n siu m, ch?p MRI ho?c ch?p CT. ?I?U TR? Tnh tr?ng ny th??ng ???c ?i?u tr? b?ng cch c?t b? ru?t th?a (c?t ru?t th?a). C hai ph??ng php c?t b? ru?t th?a:  C?t b? ru?t th?a m? phanh. Trong ph?u thu?t ny, ru?t th?a ???c c?t b? thng qua m?t ???ng r?ch l?n (v?t m?) ? ph?n b?ng d??i bn ph?i. Th? thu?t ny c th? ???c khuy?n ngh? n?u: ? Qu v? c v?t s?o l?n c?a l?n ph?u thu?t tr??c ?y. ? Qu v? c b?nh ch?y mu. ? Qu v? c thai v s?p sinh. ? Qu v? c m?t tnh tr?ng b?nh l d?n ??n khng th? n?i soi ???c, ch?ng h?n nh? nhi?m trng ti?n xa ho?c ru?t th?a b? v?.  C?t ru?t th?a n?i soi. Trong lo?i ph?u thu?t ny, ru?t th?a ???c c?t b? thng qua cc v?t m? nh?. Th? thu?t ny th??ng gy ?au t h?n v gy ra t v?n ?? h?n so v?i c?t ru?t th?a m? phanh. N c?ng c th?i gian h?i ph?c ng?n h?n. N?u ru?t th?a ? b? v?  v ? hnh thnh p xe, c th? ??t m?t ?ng d?n l?u vo ch? p xe ?? l?y d?ch ra v c th? cho dng thu?c khng sinh qua ???ng ?ng truy?n t?nh m?ch (IV). C th? c?n ho?c khng c?n c?t b? ru?t th?a. Thng tin ny khng nh?m m?c ?ch thay th? cho l?i khuyn m chuyn gia ch?m [place] s?c kh?e ni v?i qu v?. Hy b?o ??m qu v? ph?i th?o lu?n b?t k? v?n ?? g m qu v? c v?i chuyn gia ch?m [place] s?c kh?e c?a qu v?. Document Released: 01/20/2005 Document Revised: 05/14/2015 Document Reviewed: 06/07/2014 Elsevier Interactive Patient Education  2017 [place] th??a b??ng n?i soi, Ng???i l??n (Laparoscopic Appendectomy, Adult) C??t ru?t th?a n?i soi l ph?u thu?t ?? c??t bo? ru?t th??a. Ru?t th??a l m?t c?u trc gi?ng nh? ngn tay g?n vo ru?t. Trong ph?u thu?t ny, ru?t th??a ????c c??t b? thng qua hai ho?c ba v?t c?t nh? (v?t m?) v?i  s? tr?? gip c?a m?t ?ng nho?, co? ?e?n chi?u sa?ng va? c m?t camera ?? ??u (?ng n?i soi). C??t ru?t th?a n?i soi c th? ???c th?c hi?n ?? ng?n khng cho ru?t th??a vim bi? v?? ra (ra?ch). Ho?c, n c th? ???c th?c hi?n ?? ?i?u tr? nhi?m trng do ru?t th??a ?a? bi? ra?ch. N th??ng ???c th?c hi?n ngay l?p t?c sau khi vim ru?t th??a (vim ru?t th?a) ???c ch?n ?on. HY CHO CHUYN GIA CH?M [place] S?C KH?E BI?T V?:  B?t k? v?n ?? d? ?ng no m qu v? c.  T?t c? cc lo?i thu?c m qu v? ?ang s? d?ng, bao g?m c? vitamin, th?o d??c, thu?c nh? m?t, thu?c d?ng kem, thu?c khng c?n k ??n.  Nh?ng v?n ?? tr??c ?y qu v? ho?c ng??i trong gia ?nh qu v? ? b? khi s? d?ng thu?c gy m.  B?t k? cc b?nh l v? mu no m qu v? c.  Cc ph?u thu?t tr??c ? qu v? ? ???c lm.  B?t k? tnh tr?ng b?nh l no c?a qu v?.  Quy? vi? co? [place] ho??c co? th? co? thai hay khng.  NGUY C? V BI?N CH?NG Ni chung, ?y l m?t th? thu?t an ton. Tuy nhin, cc v?n ?? c th? x?y ra, bao g?m:  Nhi?m trng.  Ch?y mu.  Ph?n ?ng d? ?ng v?i thu?c.  H? t?n cho  cc c?u trc ho?c cc b? ph?n kha?c cu?a c? th?.  S? ti?ch t?? cu?a mu? (p-xe).  ?au ke?o da?i ho?c s?o t?i v?t m? ho?c bn trong b?ng.  C?c mu ?ng ? chn. TR??C KHI TH?C HI?N TH? THU?T  [place] th? ch? d?n c?a chuyn gia ch?m [place] s?c kh?e v? vi?c h?n ch? ?n ho??c u?ng.  H?i chuyn gia ch?m [place] s?c kh?e v?: ? Vi?c thay ??i ho?c d?ng s? d?ng cc lo?i thu?c thng th??ng c?a qu v?. ?i?u ny ??c bi?t quan tr?ng n?u qu v? ?ang dng thu?c tr? ti?u ???ng ho?c thu?c ch?ng ?ng mu. ? ?ang dng cc lo?i thu?c nh? aspirin v ibuprofen. Nh?ng thu?c ny c th? lm long mu. Khng dng nh?ng lo?i thu?c ny tr??c khi lm th? thu?t n?u chuyn gia ch?m [place] s?c kh?e yu c?u qu v? khng dng.  Hy h?i chuyn gia ch?m [place] s?c kh?e v? vi?c ch? ph?u thu?t c?a qu v? s? ???c ?nh d?u ho?c xc ??nh nh? th? no.  Quy? vi? c th? ???c cho du?ng thu?c khng sinh ?? gip ng?n ng?a nhi?m trng ho?c ?? ?i?u tr? b?nh vim ho?c nhi?m trng ?ang co?.  N?u quy? vi? s? ?i v? nh trong cng ngy ph?u thu?t, ha?y l?p k? ho?ch ?? c ai ? ?? v?i quy? vi? trong 24 gi?.  THU? THU?T  ?? gi?m nguy c? nhi?m trng: ? Nhm nhn vin ch?m [place] s?c kh?e c?a qu v? s? r?a tay ho?c v? sinh tay c?a h?. ? Da c?a qu v? s? ???c r?a b?ng x phng.  Qu v? s? ???c ??t m?t ?ng t?nh m?ch (IV) vo m?t trong cc t?nh m?ch. Quy? vi? s? ???c du?ng thu?c v truy?n di?ch thng qua ?ng ny.  Qu v? s? ???c cho dng m?t ho?c nhi?u trong s? nh?ng lo?i thu?c sau: ? M?t lo?i thu?c gip quy? vi? th? gin (thu?c an th?n). ? M?t lo?i thu?c lm t ch? ph?u thu?t (gy t c?c b?). ? M?t lo?i thu?c lm qu v? ng? thi?p ?i (gy m ton  thn). ? M?t lo?i thu?c ???c tim vo c?t s?ng la?m t khu v?c bn d??i v ngay trn ch? tim (gy t c?t s?ng). ? M?t lo?i thu?c ???c tim vo m?t khu v??c cu?a c? th? la?m t mo?i th?? bn d??i ch? tim (gy m khu v??c).  M?t ?ng nho?, de?o (?ng thng) c th? ???c lu?n vo bng quang c?a quy? vi? ??  thot n??c ti?u.  M?t ?ng c th? ???c truy?n qua m?i v vo da? da?y c?a quy? vi? (?ng NG, ho?c ?ng thng mu?i da? da?y) ?? thoa?t b?t ky? ch?t gi? c?a d? dy.  Bc s? ph?u thu?t s? ra?ch hai ho?c ba v?t m? nh? g?n r?n c?a quy? vi? (r?n).  Khi? gi?ng nh? khng khi? bi?nh th???ng s? ???c s? d?ng ?? b?m va?o bu?ng cu?a quy? vi?. Khi? na?y s? lm cho b?ng c?a quy? vi? phi?nh r?ng. ?i?u ny gip cc bc s? ph?u thu?t nhn th?y r rng v co? nhi?u khoa?ng tr?ng ?? lm vi?c.  M?t ?ng n?i soi s? ???c ??a va?o qua m?t v?t ra?ch.  Nh??ng d?ng c? ph?u thu?t da?i, nho? khc s? ???c ??a va?o qua ca?c v?t r?ch khc.  Ru?t th??a s? ???c ??nh v? v c??t b? thng qua m?t trong nh?ng v?t r?ch.  B?ng c th? ???c r?a s?ch ?? lo?i b? vi khu?n.  Cc v?t m? s? ???c ?ng l?i b?ng ch? ph?u thu?t (ch? khu), k?p ho?c b?ng dnh.  M?t mi?ng b?ng (b?ng) c th? ???c s? d?ng ?? che v?t m? la?i.  N?u m?t ?ng thng ???c ??a vo bng quang hay d? dy c?a quy? vi?, n s? ????c tha?o ra.  SAU KHI TH?C HI?N TH? THU?T  Huy?t p, nh?p tim, nh?p th? v n?ng ?? xy trong mu c?a qu v? s? ???c theo di th??ng xuyn cho ??n khi thu?c qu v? ? dng h?t tc d?ng.  Quy? vi? s? ???c cho du?ng thu?c gi?m ?au n?u c?n thi?t ?? gi? cho quy? vi? c?m th?y tho?i mi.  N?u ru?t th?a khng b? v?, quy? vi? c th? v? nh ngay trong nga?y ph?u thu?t.  Khng li xe trong vng 24 gi? n?u quy? vi? ????c du?ng thu?c an th?n.  N?u ru?t th?a b? v?: ? Qu v? se? ???c dng thu?c khng sinh qua m?t ?ng IV. ? Quy? vi? c th? ???c chuy?n v? nh v?i m?t ?ng d?n l?u t?m th?i.  Thng tin ny khng nh?m m?c ?ch thay th? cho l?i khuyn m chuyn gia ch?m [place] s?c kh?e ni v?i qu v?. Hy b?o ??m qu v? ph?i th?o lu?n b?t k? v?n ?? g m qu v? c v?i chuyn gia ch?m [place] s?c kh?e c?a qu v?. Document Released: 05/14/2015 Document Revised: 05/14/2015 Document Reviewed: 07/10/2014 Elsevier Interactive Patient Education  2017  [place]: POST OP INSTRUCTIONS  ######################################################################  EAT Gradually transition to a high fiber diet with a fiber supplement over the next few weeks after discharge.  Start with a pureed / full liquid diet (see below)  WALK Walk an hour a day.  Control your pain to do that.    CONTROL PAIN Control pain so that you can walk, sleep, tolerate sneezing/coughing, go up/down stairs.  HAVE A BOWEL MOVEMENT DAILY Keep your bowels regular to avoid problems.  OK to try a laxative to override constipation.  OK to use an antidairrheal to slow down diarrhea.  Call if not better after 2 tries  CALL IF YOU HAVE PROBLEMS/CONCERNS Call if you are still struggling despite following these instructions. Call if you have concerns not answered by these instructions  ######################################################################    1. DIET: Follow a light bland diet the first 24 hours after arrival home, such as soup, liquids, crackers, etc.  Be sure to include lots of fluids daily.  Avoid fast food or heavy meals as your are more likely to get nauseated.  Eat a low fat the next few days after surgery.   2. Take your usually prescribed home medications unless otherwise directed. 3. PAIN CONTROL: a. Pain is best controlled by a usual combination of three different methods TOGETHER: i. Ice/Heat ii. Over the counter pain medication iii. Prescription pain medication b. Most patients will experience some swelling and bruising around the incisions.  Ice packs or heating pads (30-60 minutes up to 6 times a day) will help. Use ice for the first few days to help decrease swelling and bruising, then switch to heat to help relax tight/sore spots and speed recovery.  Some people prefer to use ice alone, heat alone, alternating between ice & heat.  Experiment to what works for you.  Swelling and bruising can take several weeks to  resolve.   c. It is helpful to take an over-the-counter pain medication regularly for the first few weeks.  Choose one of the following that works best for you: i. Naproxen (Aleve, etc)  Two 220mg  tabs twice a day ii. Ibuprofen (Advil, etc) Three 200mg  tabs four times a day (every meal & bedtime) iii. Acetaminophen (Tylenol, etc) 500-650mg  four times a day (every meal & bedtime) d. A  prescription for pain medication (such as oxycodone, hydrocodone, etc) should be given to you upon discharge.  Take your pain medication as prescribed.  i. If you are having problems/concerns with the prescription medicine (does not control pain, nausea, vomiting, rash, itching, etc), please call us [phone] to see if we need to switch you to a different pain medicine that will work better for you and/or control your side effect better. ii. If you need a refill on your pain medication, please contact your pharmacy.  They will contact our office to request authorization. Prescriptions will not be filled after 5 pm or on week-ends. 4. Avoid getting constipated.  Between the surgery and the pain medications, it is common to experience some constipation.  Increasing fluid intake and taking a fiber supplement (such as Metamucil, Citrucel, FiberCon, MiraLax, etc) 1-2 times a day regularly will usually help prevent this problem from occurring.  A mild laxative (prune juice, Milk of Magnesia, MiraLax, etc) should be taken according to package directions if there are no bowel movements after 48 hours.   5. Watch out for diarrhea.  If you have many loose bowel movements, simplify your diet to bland foods & liquids for a few days.  Stop any stool softeners and decrease your fiber supplement.  Switching to mild anti-diarrheal medications (Kayopectate, Pepto Bismol) can help.  If this worsens or does not improve, please call us. 6. Wash / shower every day.  You may shower over the dressings as they are waterproof.  Continue to  shower over incision(s) after the dressing is off. 7. Remove your waterproof bandages 5 days after surgery.  You may leave the incision open to air.  You may replace a dressing/Band-Aid to cover the incision for comfort if you wish.  8. ACTIVITIES as tolerated:   a. You may resume regular (light) daily  activities beginning the next day--such as daily self-care, walking, climbing stairs--gradually increasing activities as tolerated.  If you can walk 30 minutes without difficulty, it is safe to try more intense activity such as jogging, treadmill, bicycling, low-impact aerobics, swimming, etc. b. Save the most intensive and strenuous activity for last such as sit-ups, heavy lifting, contact sports, etc  Refrain from any heavy lifting or straining until you are off narcotics for pain control.   c. DO NOT PUSH THROUGH PAIN.  Let pain be your guide: If it hurts to do something, don't do it.  Pain is your body warning you to avoid that activity for another week until the pain goes down. d. You may drive when you are no longer taking prescription pain medication, you can comfortably wear a seatbelt, and you can safely maneuver your car and apply brakes. e. Dennis Bast may have sexual intercourse when it is comfortable.  9. FOLLOW UP in our office a. Please call [facility] at [phone] to set up an appointment to see your surgeon in the office for a follow-up appointment approximately 2-3 weeks after your surgery. b. Make sure that you call for this appointment the day you arrive home to insure a convenient appointment time. 10. IF YOU HAVE DISABILITY OR FAMILY LEAVE FORMS, BRING THEM TO THE OFFICE FOR PROCESSING.  DO NOT GIVE THEM TO YOUR DOCTOR.   WHEN TO CALL us [phone]: 1. Poor pain control 2. Reactions / problems with new medications (rash/itching, nausea, etc)  3. Fever over 101.5 F (38.5 C) 4. Inability to urinate 5. Nausea and/or vomiting 6. Worsening swelling or bruising 7. Continued bleeding from  incision. 8. Increased pain, redness, or drainage from the incision   The clinic staff is available to answer your questions during regular business hours (8:30am-5pm).  Please dont hesitate to call and ask to speak to one of our nurses for clinical concerns.   If you have a medical emergency, go to the nearest emergency room or call 911.  A surgeon from [facility] is always on call at the [facility], [place], [place], [place], [place]  [address] ? MAIN: [phone] ? TOLL FREE: [phone] ?  FAX [phone] [email or web address]

## 2016-12-08 NOTE — Anesthesia Postprocedure Evaluation (Signed)
Anesthesia Post Note  Patient: Reginald Long  Procedure(s) Performed: APPENDECTOMY LAPAROSCOPIC (N/A Abdomen)     Patient location during evaluation: PACU Anesthesia Type: General Level of consciousness: awake and alert Pain management: pain level controlled Vital Signs Assessment: post-procedure vital signs reviewed and stable Respiratory status: spontaneous breathing, nonlabored ventilation, respiratory function stable and patient connected to nasal cannula oxygen Cardiovascular status: blood pressure returned to baseline and stable (see comments) Postop Assessment: no apparent nausea or vomiting Anesthetic complications: no Comments: Patient developed new onset A-fib post-operatively while in PACU.  Confirmed with 12 lead EKG.  Patient denied CP, dizziness.  HR in 90s-100s, BP stable.  Cardiology consult called after discussing with surgeon.  Patient to telemetry unit and will be seen by Cardiology for further recommendations.    Last Vitals:  Vitals:   12/08/16 1541 12/08/16 1652  BP: 134/83 133/77  Pulse: 63 60  Resp: 18 20  Temp: 36.4 C 36.8 C  SpO2: 98% 99%    Last Pain:  Vitals:   12/08/16 1652  TempSrc: Oral  PainSc:                  Catalina Gravel

## 2016-12-08 NOTE — ED Notes (Signed)
Report given to OR RN. OR transport took patient to OR

## 2016-12-08 NOTE — Transfer of Care (Signed)
Immediate Anesthesia Transfer of Care Note Immediate Anesthesia Transfer of Care Note  Patient: Reginald Long  Procedure(s) Performed: Procedure(s): APPENDECTOMY LAPAROSCOPIC (N/A)  Patient Location: PACU  Anesthesia Type:General  Level of Consciousness: Patient easily awoken, sedated, comfortable, cooperative, following commands, responds to stimulation.   Airway & Oxygen Therapy: Patient spontaneously breathing, ventilating well, oxygen via simple oxygen mask.  Post-op Assessment: Report given to PACU RN, vital signs reviewed and stable, moving all extremities.   Post vital signs: Reviewed and stable.  Complications: No apparent anesthesia complications Last Vitals:  Vitals:   12/08/16 0630 12/08/16 0645  BP: 138/67 133/70  Pulse: 78 72  Resp: (!) 25 (!) 30  Temp:    SpO2:      Last Pain:  Vitals:   12/08/16 0337  TempSrc: Oral         Complications: No apparent anesthesia complications

## 2016-12-08 NOTE — Anesthesia Preprocedure Evaluation (Addendum)
Anesthesia Evaluation  Patient identified by MRN, date of birth, ID band Patient awake    Reviewed: Allergy & Precautions, NPO status , Patient's Chart, lab work & pertinent test results  Airway Mallampati: II  TM Distance: >3 FB Neck ROM: Full    Dental  (+) Dental Advisory Given, Upper Dentures   Pulmonary neg pulmonary ROS, former smoker,    Pulmonary exam normal breath sounds clear to auscultation       Cardiovascular hypertension, Pt. on medications Normal cardiovascular exam Rhythm:Regular Rate:Normal  HLD   Neuro/Psych negative neurological ROS     GI/Hepatic Neg liver ROS, Appendicitis    Endo/Other  negative endocrine ROS  Renal/GU Renal InsufficiencyRenal disease     Musculoskeletal negative musculoskeletal ROS (+)   Abdominal   Peds  Hematology negative hematology ROS (+)   Anesthesia Other Findings Day of surgery medications reviewed with the patient.  Reproductive/Obstetrics                            Anesthesia Physical Anesthesia Plan  ASA: II  Anesthesia Plan: General   Post-op Pain Management:    Induction: Intravenous  PONV Risk Score and Plan: 2 and Dexamethasone and Ondansetron  Airway Management Planned: Oral ETT  Additional Equipment:   Intra-op Plan:   Post-operative Plan: Extubation in OR  Informed Consent: I have reviewed the patients History and Physical, chart, labs and discussed the procedure including the risks, benefits and alternatives for the proposed anesthesia with the patient or authorized representative who has indicated his/her understanding and acceptance.   Dental advisory given  Plan Discussed with: CRNA  Anesthesia Plan Comments: (Risks/benefits of general anesthesia discussed with patient including risk of damage to teeth, lips, gum, and tongue, nausea/vomiting, allergic reactions to medications, and the possibility of heart  attack, stroke and death.  All patient questions answered.  Patient wishes to proceed.  *Interpreter utilized throughout entire patient encounter.)      Anesthesia Quick Evaluation

## 2016-12-09 ENCOUNTER — Inpatient Hospital Stay (HOSPITAL_COMMUNITY): Payer: Medicare HMO

## 2016-12-09 ENCOUNTER — Encounter (HOSPITAL_COMMUNITY): Payer: Self-pay | Admitting: Physician Assistant

## 2016-12-09 DIAGNOSIS — I4891 Unspecified atrial fibrillation: Secondary | ICD-10-CM

## 2016-12-09 DIAGNOSIS — K3589 Other acute appendicitis without perforation or gangrene: Secondary | ICD-10-CM

## 2016-12-09 LAB — CBC
HCT: 39.4 % (ref 39.0–52.0)
Hemoglobin: 13.5 g/dL (ref 13.0–17.0)
MCH: 30.8 pg (ref 26.0–34.0)
MCHC: 34.3 g/dL (ref 30.0–36.0)
MCV: 89.7 fL (ref 78.0–100.0)
PLATELETS: 102 10*3/uL — AB (ref 150–400)
RBC: 4.39 MIL/uL (ref 4.22–5.81)
RDW: 13.2 % (ref 11.5–15.5)
WBC: 14.3 10*3/uL — ABNORMAL HIGH (ref 4.0–10.5)

## 2016-12-09 LAB — BASIC METABOLIC PANEL
Anion gap: 10 (ref 5–15)
BUN: 23 mg/dL — AB (ref 6–20)
CALCIUM: 8.1 mg/dL — AB (ref 8.9–10.3)
CO2: 22 mmol/L (ref 22–32)
Chloride: 109 mmol/L (ref 101–111)
Creatinine, Ser: 1.36 mg/dL — ABNORMAL HIGH (ref 0.61–1.24)
GFR calc Af Amer: 57 mL/min — ABNORMAL LOW (ref >60)
GFR, EST NON AFRICAN AMERICAN: 49 mL/min — AB (ref >60)
GLUCOSE: 135 mg/dL — AB (ref 65–99)
POTASSIUM: 4 mmol/L (ref 3.5–5.1)
Sodium: 141 mmol/L (ref 135–145)

## 2016-12-09 LAB — ECHOCARDIOGRAM COMPLETE

## 2016-12-09 LAB — MAGNESIUM: Magnesium: 2.1 mg/dL (ref 1.7–2.4)

## 2016-12-09 MED ORDER — SIMETHICONE 80 MG PO CHEW: 40.0000 mg | CHEWABLE_TABLET | Freq: Four times a day (QID) | ORAL | Status: DC | PRN

## 2016-12-09 MED ORDER — IOPAMIDOL (ISOVUE-300) INJECTION 61%
100.0000 mL | Freq: Once | INTRAVENOUS | Status: AC | PRN
  Administered 2016-12-09: 80 mL via INTRAVENOUS

## 2016-12-09 MED ORDER — IOPAMIDOL (ISOVUE-300) INJECTION 61%
INTRAVENOUS | Status: AC
Start: 2016-12-09 — End: 2016-12-09
  Filled 2016-12-09: qty 100

## 2016-12-09 MED ORDER — IOPAMIDOL (ISOVUE-370) INJECTION 76%
INTRAVENOUS | Status: AC
  Filled 2016-12-09: qty 100

## 2016-12-09 MED ORDER — DILTIAZEM HCL ER COATED BEADS 120 MG PO CP24
120.0000 mg | ORAL_CAPSULE | Freq: Every day | ORAL | Status: DC
Start: 2016-12-09 — End: 2016-12-10
  Administered 2016-12-09 – 2016-12-10 (×2): 120 mg via ORAL
  Filled 2016-12-09 (×2): qty 1

## 2016-12-09 NOTE — Progress Notes (Addendum)
Progress Note  Patient Name: Reginald Long Date of Encounter: 12/09/2016  Primary Cardiologist: New, Dr Meda Coffee  Subjective   Pt speaks minimal English but denies CP or SOB. Abdominal pain is ok  Inpatient Medications    Scheduled Meds: . acetaminophen  1,000 mg Oral Q8H  . diltiazem  120 mg Oral Q12H  . enoxaparin (LOVENOX) injection  40 mg Subcutaneous Q24H  . gabapentin  300 mg Oral QHS  . iopamidol      . lip balm  1 application Topical BID   Continuous Infusions: . sodium chloride 50 mL/hr at 12/09/16 1050  . lactated ringers    . lactated ringers    . piperacillin-tazobactam (ZOSYN)  IV Stopped (12/09/16 1150)   PRN Meds: alum & mag hydroxide-simeth, bisacodyl, colchicine, diphenhydrAMINE **OR** diphenhydrAMINE, guaiFENesin-dextromethorphan, hydrALAZINE, hydrocortisone, hydrocortisone cream, HYDROmorphone (DILAUDID) injection, lactated ringers, lactated ringers, magic mouthwash, menthol-cetylpyridinium, methocarbamol, ondansetron **OR** ondansetron (ZOFRAN) IV, oxyCODONE, phenol, polyethylene glycol, prochlorperazine, simethicone   Vital Signs    Vitals:   12/08/16 1744 12/08/16 2039 12/09/16 0603 12/09/16 1105  BP: 124/81 (!) 142/73 135/68 134/65  Pulse: 77 66 (!) 52 (!) 106  Resp: 18 18 19    Temp: 98 F (36.7 C) 97.6 F (36.4 C) 98.4 F (36.9 C) (!) 97.5 F (36.4 C)  TempSrc: Oral Oral Oral Oral  SpO2: 98% 98% 96% 97%    Intake/Output Summary (Last 24 hours) at 12/09/2016 1449 Last data filed at 12/09/2016 0758 Gross per 24 hour  Intake 1110 ml  Output -  Net 1110 ml    Telemetry    Afib>>SR, sinus brady to high 40s w/ PACs - Personally Reviewed  ECG    11/05, A fib, RVR, HR 100, ?LVH - Personally Reviewed  Physical Exam   GEN: No acute distress.   Neck: No JVD Cardiac: RRR, soft murmur, no rubs, or gallops.  Respiratory: Clear to auscultation bilaterally. GI: Soft, tender, non-distended  MS: No edema; No deformity. Neuro:  Nonfocal  Psych:  Normal affect   Labs    Chemistry Recent Labs  Lab 12/08/16 0311 12/09/16 0634  NA 140 141  K 3.6 4.0  CL 106 109  CO2 18* 22  GLUCOSE 182* 135*  BUN 22* 23*  CREATININE 1.45* 1.36*  CALCIUM 9.4 8.1*  PROT 7.2  --   ALBUMIN 3.9  --   AST 38  --   ALT 36  --   ALKPHOS 87  --   BILITOT 1.5*  --   GFRNONAA 46* 49*  GFRAA 53* 57*  ANIONGAP 16* 10     Hematology Recent Labs  Lab 12/08/16 0311 12/09/16 0634  WBC 7.3 14.3*  RBC 5.18 4.39  HGB 16.3 13.5  HCT 46.0 39.4  MCV 88.8 89.7  MCH 31.5 30.8  MCHC 35.4 34.3  RDW 12.8 13.2  PLT 138* 102*    Radiology    Dg Chest 2 View  Result Date: 12/08/2016 CLINICAL DATA:  Fever and hypertension.  Nonsmoker. EXAM: CHEST  2 VIEW COMPARISON:  None. FINDINGS: Elevation of the right hemidiaphragm. Cardiac enlargement without vascular congestion or edema. No consolidation or airspace disease in the lungs. No blunting of costophrenic angles. No pneumothorax. Mediastinal contours appear intact. IMPRESSION: Cardiac enlargement.  No evidence of active pulmonary disease. Electronically Signed   By: Lucienne Capers M.D.   On: 12/08/2016 05:05   Ct Abdomen Pelvis W Contrast  Result Date: 12/08/2016 CLINICAL DATA:  Nausea, vomiting and diarrhea for 1 day. Poor intake.  Assess abdominal pain, suspect diverticulitis. History of hypertension. EXAM: CT ABDOMEN AND PELVIS WITH CONTRAST TECHNIQUE: Multidetector CT imaging of the abdomen and pelvis was performed using the standard protocol following bolus administration of intravenous contrast. CONTRAST:  80 cc Isovue 300 COMPARISON:  None. FINDINGS: Mild motion degraded examination. LOWER CHEST: The heart is moderately enlarged. Pulmonary vascular congestion. No pericardial effusion. Mild gas distended distal esophagus associated with reflux. HEPATOBILIARY: Liver and gallbladder are normal. PANCREAS: Normal. SPLEEN: Normal. ADRENALS/URINARY TRACT: Kidneys are orthotopic, demonstrating symmetric  enhancement. 2 cm RIGHT lower pole solid enhancing mass. No nephrolithiasis or hydronephrosis. Homogeneously hypodense 16 mm RIGHT upper pole cyst. Too small to characterize hypodensities bilateral kidneys. The unopacified ureters are normal in course and caliber. Delayed imaging through the kidneys demonstrates symmetric prompt contrast excretion within the proximal urinary collecting system. Urinary bladder is partially distended and unremarkable. Normal adrenal glands. STOMACH/BOWEL: The stomach, small and large bowel are normal in course and caliber without inflammatory changes. Enteric contrast has not yet reached the distal small bowel. Mild colonic diverticulosis. Appendix: Location: Retrocecal. Diameter: 14 mm Appendicolith: 5 mm appendicolith mid appendix. Mucosal hyper-enhancement: Yes. Extraluminal gas: No. Periappendiceal collection: Periappendiceal fat stranding. No focal fluid collection. VASCULAR/LYMPHATIC: Aortoiliac vessels are normal in course and caliber. Moderate calcific atherosclerosis in intimal thickening. No lymphadenopathy by CT size criteria. REPRODUCTIVE: Mild prostatomegaly deforming base of bladder. OTHER: Trace free fluid in RIGHT pericolic gutter. MUSCULOSKELETAL: Nonacute. Severe L5-S1 degenerative disc with vacuum phenomena. IMPRESSION: 1. Acute appendicitis without perforation. 2. Mild colonic diverticulosis without acute diverticulitis. 3. **An incidental finding of potential clinical significance has been found. 2 cm RIGHT solid renal mass. Recommend renal protocol CT or MRI with and without contrast. This recommendation follows ACR consensus guidelines: Management of the Incidental Renal Mass on CT: A White Paper of the ACR Incidental Findings Committee. J Am Coll Radiol [phone].** 4. Acute findings discussed with and reconfirmed by Dr.DAVID GLICK on 81/02/9145 at 5:05 am. Aortic Atherosclerosis (ICD10-I70.0). Electronically Signed   By: Elon Alas M.D.   On:  12/08/2016 05:06   Ct Renal Abd W/wo  Result Date: 12/09/2016 CLINICAL DATA:  Acute appendicitis. Incidental finding of renal mass. EXAM: CT ABDOMEN WITHOUT AND WITH CONTRAST TECHNIQUE: Multidetector CT imaging of the abdomen was performed following the standard protocol before and following the bolus administration of intravenous contrast. CONTRAST:  16mL ISOVUE-300 IOPAMIDOL (ISOVUE-300) INJECTION 61% COMPARISON:  CT 12/08/2016. FINDINGS: Lower chest: Small RIGHT effusion. Hepatobiliary:  No focal hepatic lesion.  Gallbladder normal Pancreas: Pancreas is normal. No ductal dilatation. No pancreatic inflammation. Spleen: Normal spleen Adrenals/urinary tract: Adrenal glands are normal. There is enhancing round lesion extending from the lower pole of the RIGHT kidney which measures 19 x 16 mm (image 75, series 6) and has uniform post-contrast enhancement. No additional enhancing renal lesions. No obstruction Patient Movement, Presumably related to breathing, degrades the imaging. Particularly the multiplanar reconstructions. Stomach/Bowel: Stomach and limited view of the small bowel colon is unremarkable. Postsurgical change in the cecum consistent recent appendectomy. Vascular/Lymphatic: Abdominal aortic normal. No abdominal adenopathy Other: No free fluid. Musculoskeletal: No aggressive osseous lesion. IMPRESSION: 1. Enhancing solid lesion extending from the lower pole of RIGHT kidney most consistent with renal cell carcinoma. 2. Exam by degraded by respiratory patient motion. No additional lesion identified. Electronically Signed   By: Suzy Bouchard M.D.   On: 12/09/2016 12:07    Cardiac Studies   ECHO: Ordered  Patient Profile     76 y.o. male w/ hx  HTN, HLD, was admitted 11/05 w/ acute appendicitis, s/p surgery. Pt had Afib after surgery, cards asked to see.   Assessment & Plan   1. PAF: Pt spontaneously converted to SR last pm - tolerated Cardizem SR 120 mg bid - some bradycardia overnight,  asymptomatic, no med change - still w/ PACs - f/u on echo - CHADS2VASC=3 (age x 2, HTN) - No hx afib, documented SR yesterday prior to PAF - is on DVT lovenox  2. HTN - BP close to target on the Cardizem - d/c amlodipine while on Cardizem  Otherwise, per IM Principal Problem:   Acute appendicitis Active Problems:   Non-English speaking patient ([place])   Right renal mass   CKD (chronic kidney disease), stage III ([place])   PAF ([place])   Acute gangrenous appendicitis with perforation and peritonitis  For questions or updates, please contact [facility] Please consult [email or web address] for contact info under Cardiology/STEMI.   Signed, Ena Dawley, MD  12/09/2016, 2:49 PM    The patient was seen, examined and discussed with Rosaria Ferries, PA-C and I agree with the above.   The patient cardioverted spontaneously to SR at 4 am today, bradycardic on telemetry, I would continue cardizem CD 120 mg po daily and monitor telemetry till tomorrow, anticipated discharge in the am. Echo is pending. Cardizem will also manage his hypertension.  Ena Dawley, MD 12/09/2016

## 2016-12-09 NOTE — Care Management Note (Signed)
Case Management Note  Patient Details  Name: Reginald Long MRN: [id number] Date of Birth: [birth year]  Subjective/Objective:                  Gangrene appendicitis/a.fib  Action/Plan: Date: December 09, 2016 [name]  [phone] Chart and notes review for patient progress and needs. Will follow for case management and discharge needs. Next review date: [id number] Expected Discharge Date:  (unknown)               Expected Discharge Plan:  Home/Self Care  In-House Referral:     Discharge planning Services  CM Consult  Post Acute Care Choice:    Choice offered to:     DME Arranged:    DME Agency:     HH Arranged:    HH Agency:     Status of Service:  In process, will continue to follow  If discussed at Long Length of Stay Meetings, dates discussed:    Additional Comments:  Leeroy Cha, RN 12/09/2016, 8:48 AM

## 2016-12-09 NOTE — Progress Notes (Signed)
  Echocardiogram 2D Echocardiogram has been performed.  Merrie Roof F 12/09/2016, 11:51 AM

## 2016-12-09 NOTE — Progress Notes (Signed)
[facility] Progress Note  1 Day Post-Op  Subjective: CC: no complaints Patient is vietnamese speaking and a phone translator was used to communicate. Patient reports only mild abdominal pain, only taking scheduled tylenol. Wanting to get up and walk some today. Denies n/v, tolerating clears. Passing flatus, no BM. Denies chest pain, SOB, palpitations.   Objective: Vital signs in last 24 hours: Temp:  [97.5 F (36.4 C)-98.4 F (36.9 C)] 98.4 F (36.9 C) (11/06 0603) Pulse Rate:  [52-121] 52 (11/06 0603) Resp:  [18-22] 19 (11/06 0603) BP: (97-144)/(60-91) 135/68 (11/06 0603) SpO2:  [95 %-100 %] 96 % (11/06 0603) Last BM Date: 12/08/16  Intake/Output from previous day: 11/05 0701 - 11/06 0700 In: 3300 [I.V.:2200; IV Piggyback:1100] Out: 310 [Urine:300; Blood:10] Intake/Output this shift: No intake/output data recorded.  PE: Gen:  Alert, NAD, pleasant Card:  Regular rate and rhythm, pedal pulses 2+ BL Pulm:  Normal effort, clear to auscultation bilaterally Abd: Soft, mild TTP in RLQ, non-distended, bowel sounds present, no HSM, incisions C/D/I Skin: warm and dry, no rashes  Psych: A&Ox3   Lab Results:  Recent Labs    12/08/16 0311 12/09/16 0634  WBC 7.3 14.3*  HGB 16.3 13.5  HCT 46.0 39.4  PLT 138* 102*   BMET Recent Labs    12/08/16 0311 12/09/16 0634  NA 140 141  K 3.6 4.0  CL 106 109  CO2 18* 22  GLUCOSE 182* 135*  BUN 22* 23*  CREATININE 1.45* 1.36*  CALCIUM 9.4 8.1*   PT/INR No results for input(s): LABPROT, INR in the last 72 hours. CMP     Component Value Date/Time   NA 141 12/09/2016 0634   NA 140 06/16/2016 0940   K 4.0 12/09/2016 0634   CL 109 12/09/2016 0634   CO2 22 12/09/2016 0634   GLUCOSE 135 (H) 12/09/2016 0634   BUN 23 (H) 12/09/2016 0634   BUN 23 06/16/2016 0940   CREATININE 1.36 (H) 12/09/2016 0634   CREATININE 1.24 (H) 12/04/2015 1643   CALCIUM 8.1 (L) 12/09/2016 0634   PROT 7.2 12/08/2016 0311   PROT 7.3  06/16/2016 0940   ALBUMIN 3.9 12/08/2016 0311   ALBUMIN 4.5 06/16/2016 0940   AST 38 12/08/2016 0311   ALT 36 12/08/2016 0311   ALKPHOS 87 12/08/2016 0311   BILITOT 1.5 (H) 12/08/2016 0311   BILITOT 0.5 06/16/2016 0940   GFRNONAA 49 (L) 12/09/2016 0634   GFRAA 57 (L) 12/09/2016 0634   Lipase     Component Value Date/Time   LIPASE 25 12/08/2016 0311       Studies/Results: Dg Chest 2 View  Result Date: 12/08/2016 CLINICAL DATA:  Fever and hypertension.  Nonsmoker. EXAM: CHEST  2 VIEW COMPARISON:  None. FINDINGS: Elevation of the right hemidiaphragm. Cardiac enlargement without vascular congestion or edema. No consolidation or airspace disease in the lungs. No blunting of costophrenic angles. No pneumothorax. Mediastinal contours appear intact. IMPRESSION: Cardiac enlargement.  No evidence of active pulmonary disease. Electronically Signed   By: Lucienne Capers M.D.   On: 12/08/2016 05:05   Ct Abdomen Pelvis W Contrast  Result Date: 12/08/2016 CLINICAL DATA:  Nausea, vomiting and diarrhea for 1 day. Poor intake. Assess abdominal pain, suspect diverticulitis. History of hypertension. EXAM: CT ABDOMEN AND PELVIS WITH CONTRAST TECHNIQUE: Multidetector CT imaging of the abdomen and pelvis was performed using the standard protocol following bolus administration of intravenous contrast. CONTRAST:  80 cc Isovue 300 COMPARISON:  None. FINDINGS: Mild motion degraded examination. LOWER  CHEST: The heart is moderately enlarged. Pulmonary vascular congestion. No pericardial effusion. Mild gas distended distal esophagus associated with reflux. HEPATOBILIARY: Liver and gallbladder are normal. PANCREAS: Normal. SPLEEN: Normal. ADRENALS/URINARY TRACT: Kidneys are orthotopic, demonstrating symmetric enhancement. 2 cm RIGHT lower pole solid enhancing mass. No nephrolithiasis or hydronephrosis. Homogeneously hypodense 16 mm RIGHT upper pole cyst. Too small to characterize hypodensities bilateral kidneys. The  unopacified ureters are normal in course and caliber. Delayed imaging through the kidneys demonstrates symmetric prompt contrast excretion within the proximal urinary collecting system. Urinary bladder is partially distended and unremarkable. Normal adrenal glands. STOMACH/BOWEL: The stomach, small and large bowel are normal in course and caliber without inflammatory changes. Enteric contrast has not yet reached the distal small bowel. Mild colonic diverticulosis. Appendix: Location: Retrocecal. Diameter: 14 mm Appendicolith: 5 mm appendicolith mid appendix. Mucosal hyper-enhancement: Yes. Extraluminal gas: No. Periappendiceal collection: Periappendiceal fat stranding. No focal fluid collection. VASCULAR/LYMPHATIC: Aortoiliac vessels are normal in course and caliber. Moderate calcific atherosclerosis in intimal thickening. No lymphadenopathy by CT size criteria. REPRODUCTIVE: Mild prostatomegaly deforming base of bladder. OTHER: Trace free fluid in RIGHT pericolic gutter. MUSCULOSKELETAL: Nonacute. Severe L5-S1 degenerative disc with vacuum phenomena. IMPRESSION: 1. Acute appendicitis without perforation. 2. Mild colonic diverticulosis without acute diverticulitis. 3. **An incidental finding of potential clinical significance has been found. 2 cm RIGHT solid renal mass. Recommend renal protocol CT or MRI with and without contrast. This recommendation follows ACR consensus guidelines: Management of the Incidental Renal Mass on CT: A White Paper of the ACR Incidental Findings Committee. J Am Coll Radiol [phone].** 4. Acute findings discussed with and reconfirmed by Dr.DAVID GLICK on 91/05/7827 at 5:05 am. Aortic Atherosclerosis (ICD10-I70.0). Electronically Signed   By: Elon Alas M.D.   On: 12/08/2016 05:06    Anti-infectives: Anti-infectives (From admission, onward)   Start     Dose/Rate Route Frequency Ordered Stop   12/08/16 1600  piperacillin-tazobactam (ZOSYN) IVPB 3.375 g     3.375 g 12.5  mL/hr over 240 Minutes Intravenous Every 8 hours 12/08/16 1451     12/08/16 1000  metroNIDAZOLE (FLAGYL) IVPB 500 mg  Status:  Discontinued     500 mg 100 mL/hr over 60 Minutes Intravenous On call to O.R. 12/08/16 0815 12/08/16 1031   12/08/16 0930  cefTRIAXone (ROCEPHIN) 2 g in dextrose 5 % 50 mL IVPB  Status:  Discontinued     2 g 100 mL/hr over 30 Minutes Intravenous On call to O.R. 12/08/16 0815 12/08/16 1031   12/08/16 0515  cefTRIAXone (ROCEPHIN) 2 g in dextrose 5 % 50 mL IVPB     2 g 100 mL/hr over 30 Minutes Intravenous  Once 12/08/16 0513 12/08/16 0547   12/08/16 0515  metroNIDAZOLE (FLAGYL) IVPB 500 mg     500 mg 100 mL/hr over 60 Minutes Intravenous  Once 12/08/16 0513 12/08/16 0650       Assessment/Plan HTN HLD Hx of gout  Acute gangrenous appendicitis S/P laparoscopic appendectomy 12/08/16 Dr. Johney Maine - POD#1 - WBC 14.3, expected increase post-operatively; afebrile - will continue to monitor - patient tolerating CLD, +flatus - will advance diet as tolerated - continue IV abx - will need 7d total abx AKI - baseline Cr appears to be around 1.2, today is 1.36 - continue IVF and recheck BMET in AM - CT renal was ordered 11/5 but not yet done A.Fib - cardiology consulted yesterday, appreciate input - cardizem q12 h - echo pending  FEN: ADAT; IVF VTE: SCDs, ok to start lovenox today  ID: Rocephin/Flagyl (11/5), IV zosyn (11/5>>)  Plan: Echo pending. AM labs. Likely home tomorrow if tolerating diet and ok from cardiology standpoint  LOS: 1 day    Brigid Re , [facility] 12/09/2016, 8:26 AM Pager: [phone] Consults: [phone] Mon-Fri 7:00 am-4:30 pm Sat-Sun 7:00 am-11:30 am

## 2016-12-09 NOTE — Plan of Care (Signed)
Patient is alert and oriented x4 during the shift; no fall occurred during the shift. Progressing to achieve the expected outcomes at DC.

## 2016-12-09 NOTE — Progress Notes (Signed)
PHARMACY - PHYSICIAN COMMUNICATION CRITICAL VALUE ALERT - BLOOD CULTURE IDENTIFICATION (BCID)  Results for orders placed or performed during the hospital encounter of 12/08/16  Blood Culture ID Panel (Reflexed) (Collected: 12/08/2016  3:25 AM)  Result Value Ref Range   Enterococcus species NOT DETECTED NOT DETECTED   Listeria monocytogenes NOT DETECTED NOT DETECTED   Staphylococcus species NOT DETECTED NOT DETECTED   Staphylococcus aureus NOT DETECTED NOT DETECTED   Streptococcus species NOT DETECTED NOT DETECTED   Streptococcus agalactiae NOT DETECTED NOT DETECTED   Streptococcus pneumoniae NOT DETECTED NOT DETECTED   Streptococcus pyogenes NOT DETECTED NOT DETECTED   Acinetobacter baumannii NOT DETECTED NOT DETECTED   Enterobacteriaceae species DETECTED (A) NOT DETECTED   Enterobacter cloacae complex NOT DETECTED NOT DETECTED   Escherichia coli DETECTED (A) NOT DETECTED   Klebsiella oxytoca NOT DETECTED NOT DETECTED   Klebsiella pneumoniae NOT DETECTED NOT DETECTED   Proteus species NOT DETECTED NOT DETECTED   Serratia marcescens NOT DETECTED NOT DETECTED   Carbapenem resistance NOT DETECTED NOT DETECTED   Haemophilus influenzae NOT DETECTED NOT DETECTED   Neisseria meningitidis NOT DETECTED NOT DETECTED   Pseudomonas aeruginosa NOT DETECTED NOT DETECTED   Candida albicans NOT DETECTED NOT DETECTED   Candida glabrata NOT DETECTED NOT DETECTED   Candida krusei NOT DETECTED NOT DETECTED   Candida parapsilosis NOT DETECTED NOT DETECTED   Candida tropicalis NOT DETECTED NOT DETECTED    Name of physician (or Provider) Contacted: Dr. Harlow Asa, I informed MD patient was NOT on any iv antbiotics, but was mistaken, when tried to call and update information, I got no answer.  I would not have asked for change in antibiotics, if I noticed was already on zosyn.  Changes to prescribed antibiotics required: None, already on zosyn, will allow rounding team to consider change.  Nani Skillern Crowford 12/09/2016  2:03 AM

## 2016-12-10 DIAGNOSIS — I10 Essential (primary) hypertension: Secondary | ICD-10-CM

## 2016-12-10 LAB — BASIC METABOLIC PANEL
ANION GAP: 9 (ref 5–15)
BUN: 26 mg/dL — ABNORMAL HIGH (ref 6–20)
CHLORIDE: 110 mmol/L (ref 101–111)
CO2: 22 mmol/L (ref 22–32)
Calcium: 8.4 mg/dL — ABNORMAL LOW (ref 8.9–10.3)
Creatinine, Ser: 1.4 mg/dL — ABNORMAL HIGH (ref 0.61–1.24)
GFR calc non Af Amer: 48 mL/min — ABNORMAL LOW (ref >60)
GFR, EST AFRICAN AMERICAN: 55 mL/min — AB (ref >60)
Glucose, Bld: 119 mg/dL — ABNORMAL HIGH (ref 65–99)
POTASSIUM: 4 mmol/L (ref 3.5–5.1)
SODIUM: 141 mmol/L (ref 135–145)

## 2016-12-10 LAB — CULTURE, BLOOD (ROUTINE X 2)
SPECIAL REQUESTS: ADEQUATE
Special Requests: ADEQUATE

## 2016-12-10 LAB — CBC
HEMATOCRIT: 39.7 % (ref 39.0–52.0)
HEMOGLOBIN: 13.2 g/dL (ref 13.0–17.0)
MCH: 30.2 pg (ref 26.0–34.0)
MCHC: 33.2 g/dL (ref 30.0–36.0)
MCV: 90.8 fL (ref 78.0–100.0)
PLATELETS: 129 10*3/uL — AB (ref 150–400)
RBC: 4.37 MIL/uL (ref 4.22–5.81)
RDW: 13.4 % (ref 11.5–15.5)
WBC: 11 10*3/uL — AB (ref 4.0–10.5)

## 2016-12-10 MED ORDER — ENOXAPARIN SODIUM 30 MG/0.3ML ~~LOC~~ SOLN
30.0000 mg | SUBCUTANEOUS | Status: DC
  Administered 2016-12-10: 30 mg via SUBCUTANEOUS
  Filled 2016-12-10: qty 0.3

## 2016-12-10 MED ORDER — AMOXICILLIN-POT CLAVULANATE 875-125 MG PO TABS: 1.0000 | ORAL_TABLET | Freq: Two times a day (BID) | ORAL | 0 refills | Status: DC

## 2016-12-10 MED ORDER — SULFAMETHOXAZOLE-TRIMETHOPRIM 800-160 MG PO TABS: 1.0000 | ORAL_TABLET | Freq: Two times a day (BID) | ORAL | 1 refills | Status: DC

## 2016-12-10 MED ORDER — DILTIAZEM HCL ER COATED BEADS 120 MG PO CP24: 120.0000 mg | ORAL_CAPSULE | Freq: Every day | ORAL | 1 refills | Status: DC

## 2016-12-10 NOTE — Progress Notes (Addendum)
Patient is discharged to home; DC package is given to patient. Discharge instructions including medication was given to patient and educated about post op care; patient has no question. Patient left at 1830 after finishing IV antibiotic and had no problem after eating heart healthy diet as MD ordered.

## 2016-12-10 NOTE — Progress Notes (Addendum)
Progress Note  Patient Name: Reginald Long Date of Encounter: 12/10/2016  Primary Cardiologist: New, Dr Meda Coffee  Subjective   Pt denies CP or SOB, says had no presyncope or dizziness w/ low HR.  Inpatient Medications    Scheduled Meds: . acetaminophen  1,000 mg Oral Q8H  . diltiazem  120 mg Oral Daily  . enoxaparin (LOVENOX) injection  30 mg Subcutaneous Q24H  . gabapentin  300 mg Oral QHS  . lip balm  1 application Topical BID   Continuous Infusions: . sodium chloride 50 mL/hr at 12/09/16 1050  . lactated ringers    . lactated ringers    . piperacillin-tazobactam (ZOSYN)  IV 3.375 g (12/10/16 0759)   PRN Meds: alum & mag hydroxide-simeth, bisacodyl, colchicine, diphenhydrAMINE **OR** diphenhydrAMINE, guaiFENesin-dextromethorphan, hydrALAZINE, hydrocortisone, hydrocortisone cream, HYDROmorphone (DILAUDID) injection, lactated ringers, lactated ringers, magic mouthwash, menthol-cetylpyridinium, methocarbamol, ondansetron **OR** ondansetron (ZOFRAN) IV, oxyCODONE, phenol, polyethylene glycol, prochlorperazine, simethicone   Vital Signs    Vitals:   12/09/16 1105 12/09/16 1400 12/09/16 2158 12/10/16 0618  BP: 134/65 125/68 (!) 141/71 (!) 147/64  Pulse: (!) 106 (!) 57 66 (!) 53  Resp:   19 19  Temp: (!) 97.5 F (36.4 C) 97.7 F (36.5 C) 97.6 F (36.4 C) 97.8 F (36.6 C)  TempSrc: Oral Oral Oral Oral  SpO2: 97% 98% 98% 97%    Intake/Output Summary (Last 24 hours) at 12/10/2016 0801 Last data filed at 12/10/2016 0300 Gross per 24 hour  Intake 1381.66 ml  Output -  Net 1381.66 ml    Telemetry    Afib>>SR 11/05, sinus brady w/ PACs. HR sustained low 40s high 30s - Personally Reviewed  ECG    11/05, A fib, RVR, HR 100, ?LVH - Personally Reviewed  Physical Exam   GEN: No acute distress.   Neck: No JVD Cardiac: RRR, soft murmur, no rubs, or gallops.  Respiratory: Clear to auscultation bilaterally. GI: Soft, not tender, non-distended  MS: No edema; No  deformity. Neuro:  Nonfocal  Psych: Normal affect   Labs    Chemistry Recent Labs  Lab 12/08/16 0311 12/09/16 0634 12/10/16 0547  NA 140 141 141  K 3.6 4.0 4.0  CL 106 109 110  CO2 18* 22 22  GLUCOSE 182* 135* 119*  BUN 22* 23* 26*  CREATININE 1.45* 1.36* 1.40*  CALCIUM 9.4 8.1* 8.4*  PROT 7.2  --   --   ALBUMIN 3.9  --   --   AST 38  --   --   ALT 36  --   --   ALKPHOS 87  --   --   BILITOT 1.5*  --   --   GFRNONAA 46* 49* 48*  GFRAA 53* 57* 55*  ANIONGAP 16* 10 9     Hematology Recent Labs  Lab 12/08/16 0311 12/09/16 0634 12/10/16 0547  WBC 7.3 14.3* 11.0*  RBC 5.18 4.39 4.37  HGB 16.3 13.5 13.2  HCT 46.0 39.4 39.7  MCV 88.8 89.7 90.8  MCH 31.5 30.8 30.2  MCHC 35.4 34.3 33.2  RDW 12.8 13.2 13.4  PLT 138* 102* 129*    Radiology    Ct Renal Abd W/wo  Result Date: 12/09/2016 CLINICAL DATA:  Acute appendicitis. Incidental finding of renal mass. EXAM: CT ABDOMEN WITHOUT AND WITH CONTRAST TECHNIQUE: Multidetector CT imaging of the abdomen was performed following the standard protocol before and following the bolus administration of intravenous contrast. CONTRAST:  58mL ISOVUE-300 IOPAMIDOL (ISOVUE-300) INJECTION 61% COMPARISON:  CT 12/08/2016.  FINDINGS: Lower chest: Small RIGHT effusion. Hepatobiliary:  No focal hepatic lesion.  Gallbladder normal Pancreas: Pancreas is normal. No ductal dilatation. No pancreatic inflammation. Spleen: Normal spleen Adrenals/urinary tract: Adrenal glands are normal. There is enhancing round lesion extending from the lower pole of the RIGHT kidney which measures 19 x 16 mm (image 75, series 6) and has uniform post-contrast enhancement. No additional enhancing renal lesions. No obstruction Patient Movement, Presumably related to breathing, degrades the imaging. Particularly the multiplanar reconstructions. Stomach/Bowel: Stomach and limited view of the small bowel colon is unremarkable. Postsurgical change in the cecum consistent recent  appendectomy. Vascular/Lymphatic: Abdominal aortic normal. No abdominal adenopathy Other: No free fluid. Musculoskeletal: No aggressive osseous lesion. IMPRESSION: 1. Enhancing solid lesion extending from the lower pole of RIGHT kidney most consistent with renal cell carcinoma. 2. Exam by degraded by respiratory patient motion. No additional lesion identified. Electronically Signed   By: Suzy Bouchard M.D.   On: 12/09/2016 12:07    Cardiac Studies   ECHO: 12/09/2016 - Left ventricle: The cavity size was normal. Wall thickness was   increased in a pattern of mild LVH. Indeterminant diastolic   function. The estimated ejection fraction was 55%. Wall motion   was normal; there were no regional wall motion abnormalities. - Aortic valve: There was no stenosis. - Mitral valve: There was trivial regurgitation. - Left atrium: The atrium was severely dilated. - Right ventricle: The cavity size was normal. Systolic function   was normal. - Right atrium: The atrium was mildly dilated. - Tricuspid valve: Peak RV-RA gradient (S): 24 mm Hg. - Pulmonary arteries: PA peak pressure: 32 mm Hg (S). - Systemic veins: IVC measured 2.2 cm with normal respirophasic   variation, suggesting RA pressure 8 mmHg. Impressions: - Normal LV size with mild LV hypertrophy. EF 55%. Normal RV size   and systolic function. Severe LAE.   Patient Profile     76 y.o. male w/ hx HTN, HLD, was admitted 11/05 w/ acute appendicitis, s/p surgery. Pt had Afib after surgery, cards asked to see.   Assessment & Plan   1. PAF: Pt spontaneously converted to SR 11/05 pm - Cardizem CD given last pm, HR sustained low 40s, high 30s overnight - still w/ PACs - echo w/ nl EF, mild LVH, no WMA, severe LA dilatation  - CHADS2VASC=3 (age x 2, HTN) - No hx afib, documented SR prior to PAF - is on DVT lovenox - discuss w/ MD if pt should be on anticoag w/ severe LA dilatation  2. HTN - BP close to target on the Cardizem - d/c  amlodipine while on Cardizem - think ok to restart lisinopril 20 mg at d/c  Otherwise, per IM, f/u appt arranged. Principal Problem:   Acute appendicitis Active Problems:   Non-English speaking patient ([place])   Right renal mass   CKD (chronic kidney disease), stage III (HCC)   PAF (HCC)   Acute gangrenous appendicitis with perforation and peritonitis   For questions or updates, please contact [facility] Please consult [email or web address] for contact info under Cardiology/STEMI.   Signed, Rosaria Ferries, PA-C  12/10/2016, 8:01 AM    The patient was seen, examined and discussed with Rosaria Ferries, PA-C and I agree with the above.   The patient cardioverted spontaneously to SR at 4 am on 11/5, episodic bradycardia on telemetry at night, I would continue cardizem CD 120 mg po daily, no anticoagulation needed as his a-fib less than 12 hours. We will sign off. We  will arrange for a follow up as outpatient, he can be discharged today. anticipated discharge in the am. Echo showed normal LVEF, severely dilated left atrium.  Ena Dawley, MD 12/10/2016

## 2016-12-10 NOTE — Discharge Summary (Signed)
[facility] Discharge Summary   Patient ID: Reginald Long MRN: [id number] DOB/AGE: [birth year] 76 y.o.  Admit date: 12/08/2016 Discharge date: 12/10/2016  Admitting Diagnosis: Acute appendicitis  Discharge Diagnosis Patient Active Problem List   Diagnosis Date Noted  . Acute appendicitis 12/08/2016  . Non-English speaking patient Reginald Long) 12/08/2016  . Right renal mass 12/08/2016  . CKD (chronic kidney disease), stage III ([place]) 12/08/2016  . PAF (paroxysmal atrial fibrillation) ([place]) 12/08/2016  . Acute gangrenous appendicitis with perforation and peritonitis 12/08/2016  . Viral enteritis 12/04/2015  . Hyperlipidemia 12/04/2015  . Essential hypertension 12/04/2015  . Right foot pain 12/04/2015  . Acute gout of right ankle 12/04/2015  . Melanoma of skin, site unspecified 10/26/2013    Consultants Cardiology  Imaging: Ct Renal Abd W/wo  Result Date: 12/09/2016 CLINICAL DATA:  Acute appendicitis. Incidental finding of renal mass. EXAM: CT ABDOMEN WITHOUT AND WITH CONTRAST TECHNIQUE: Multidetector CT imaging of the abdomen was performed following the standard protocol before and following the bolus administration of intravenous contrast. CONTRAST:  42mL ISOVUE-300 IOPAMIDOL (ISOVUE-300) INJECTION 61% COMPARISON:  CT 12/08/2016. FINDINGS: Lower chest: Small RIGHT effusion. Hepatobiliary:  No focal hepatic lesion.  Gallbladder normal Pancreas: Pancreas is normal. No ductal dilatation. No pancreatic inflammation. Spleen: Normal spleen Adrenals/urinary tract: Adrenal glands are normal. There is enhancing round lesion extending from the lower pole of the RIGHT kidney which measures 19 x 16 mm (image 75, series 6) and has uniform post-contrast enhancement. No additional enhancing renal lesions. No obstruction Patient Movement, Presumably related to breathing, degrades the imaging. Particularly the multiplanar reconstructions. Stomach/Bowel: Stomach and limited view of the small bowel  colon is unremarkable. Postsurgical change in the cecum consistent recent appendectomy. Vascular/Lymphatic: Abdominal aortic normal. No abdominal adenopathy Other: No free fluid. Musculoskeletal: No aggressive osseous lesion. IMPRESSION: 1. Enhancing solid lesion extending from the lower pole of RIGHT kidney most consistent with renal cell carcinoma. 2. Exam by degraded by respiratory patient motion. No additional lesion identified. Electronically Signed   By: Suzy Bouchard M.D.   On: 12/09/2016 12:07    Procedures Dr. [name] (12/08/16) - Laparoscopic Appendectomy  Hospital Course:  Patient is a 76 y.o. male who presented to [facility] with abdominal pain.  Workup showed acute appendicitis.  Patient was admitted and underwent procedure listed above.  Tolerated procedure well and was transferred to the floor. Patient developed new onset A.Fib post-operatively and cardiology was consulted. A.fib was controlled with diltiazem. Cardiology recommended discharge on daily diltiazem with outpatient follow up.  Diet was advanced as tolerated.  On POD#2, the patient was voiding well, tolerating diet, ambulating well, pain well controlled, vital signs stable, incisions c/d/i and felt stable for discharge home.  Patient will follow up in our office in 2 weeks and knows to call with questions or concerns.  He will call to confirm appointment date/time.  He will follow up with cardiology for Atrial fibrillation.   Physical Exam: General:  Alert, NAD, pleasant, comfortable Abd:  Soft, ND, no tenderness, incisions C/D/I  Allergies as of 12/10/2016   No Known Allergies     Medication List    TAKE these medications   amLODipine 2.5 MG tablet Commonly known as:  NORVASC Take 1 tablet (2.5 mg total) by mouth daily.   amoxicillin-clavulanate 875-125 MG tablet Commonly known as:  AUGMENTIN Take 1 tablet every 12 (twelve) hours for 5 days by mouth.   colchicine 0.6 MG tablet Take two tablets at onset of  flare up then 0.6mg   one hour later. What changed:    how much to take  how to take this  when to take this  reasons to take this  additional instructions   diclofenac sodium 1 % Gel Commonly known as:  VOLTAREN Apply 4 g topically 4 (four) times daily.   diltiazem 120 MG 24 hr capsule Commonly known as:  CARDIZEM CD Take 1 capsule (120 mg total) daily by mouth. Start taking on:  12/11/2016   lisinopril 20 MG tablet Commonly known as:  PRINIVIL,ZESTRIL Take 1 tablet (20 mg total) by mouth daily.        Follow-up [facility], [place]. Go on 12/30/2016.   Specialty:  General Surgery Why:  Your appointment is at 9:30 AM. Please arrive 30 min ahead of scheduled time. Bring photo ID and insurance information.  Contact information: [address] [place] [phone]       Lyda Jester M, PA-C Follow up on 12/29/2016.   Specialties:  Cardiology, Radiology Why:  Please arrive at 8:30 am for a 9:00 am appt. If you are bringing someone with you to translate, let us know.  Contact information: [address] [place] [address] [phone]           Signed: Brigid Re, [facility] 12/10/2016, 2:58 PM Pager: [phone] Consults: [phone] Mon-Fri 7:00 am-4:30 pm Sat-Sun 7:00 am-11:30 am

## 2016-12-15 LAB — CULTURE, BLOOD (ROUTINE X 2)
CULTURE: NO GROWTH
CULTURE: NO GROWTH
SPECIAL REQUESTS: ADEQUATE
Special Requests: ADEQUATE

## 2016-12-22 ENCOUNTER — Ambulatory Visit (INDEPENDENT_AMBULATORY_CARE_PROVIDER_SITE_OTHER): Payer: Medicare HMO | Admitting: Cardiology

## 2016-12-22 ENCOUNTER — Encounter: Payer: Self-pay | Admitting: Cardiology

## 2016-12-22 VITALS — BP 136/60 | HR 53 | Ht 60.0 in | Wt 136.8 lb

## 2016-12-22 DIAGNOSIS — I4891 Unspecified atrial fibrillation: Secondary | ICD-10-CM

## 2016-12-22 NOTE — Patient Instructions (Addendum)
Medication Instructions:   STOP TAKING AMLODIPINE   If you need a refill on your cardiac medications before your next appointment, please call your pharmacy.  Labwork: NONE ORDERED  TODAY    Testing/Procedures: NONE ORDERED  TODAY    Follow-Up: Your physician wants you to follow-up in:  IN  6  MONTHS WITH DR. Johann Capers will receive a reminder letter in the mail two months in advance. If you don't receive a letter, please call our office to schedule the follow-up appointment.   Any Other Special Instructions Will Be Listed Below (If Applicable).  CONTACT OFFICE IF YOU HAVE ANY  PALPITATIONS FAST HEART RATE CHEST PAIN  SHORTNESS OF BREATH  LIGHTHEADEDNESS OR DIZZINESS

## 2016-12-22 NOTE — Progress Notes (Signed)
12/22/2016 Lanetta Inch   Nov 26, 1940  [id number]  Primary Physician System, Provider Not In Primary Cardiologist: Dr. Meda Coffee    Reason for Visit/CC: [facility] F/u for post hospital atrial fibrillation   HPI:  76 y.o. male w/ hx HTN, HLD, was admitted 11/05 w/ acute appendicitis, s/p surgery. Pt had Afib after surgery, cards asked to see. Pt was seen by Dr. Meda Coffee. He was placed on Cardizem and had spontaneous conversion back to NSR. CHA2DS2 VASc score was calculated at 2 for age and HTN, however given that his afib only lasted less than 12 hrs, there was no indication for oral anticoagulation, per Dr. Meda Coffee. He had mild transient sinus bradycardia, however Dr. Meda Coffee recommended he continue Cardizem CD 120 mg daily. Pt also had an echocardiogram that showed normal LVEF and severely dilated LA.  Pt presents to clinic today for post hospital f/u. He is here with his son. Doing very well. He feels great. He denies any cardiac symptoms. No palpitations, dyspnea, fatigue, CP, dizziness or syncope. EKG shows sinus brady 53 bpm, but he is tolerating well. He is on Cardizem. Also still taking amlodipine (I advise that he discontinue).  He has recovered well from his appendectomy. He has surgical f/u later today.    Current Meds  Medication Sig  . colchicine 0.6 MG tablet Take two tablets at onset of flare up then 0.6mg  one hour later. (Patient taking differently: Take 0.6 mg 2 (two) times daily as needed by mouth (Gout). )  . diclofenac sodium (VOLTAREN) 1 % GEL Apply 4 g topically 4 (four) times daily.  Marland Kitchen diltiazem (CARDIZEM CD) 120 MG 24 hr capsule Take 1 capsule (120 mg total) daily by mouth.  Marland Kitchen lisinopril (PRINIVIL,ZESTRIL) 20 MG tablet Take 1 tablet (20 mg total) by mouth daily.  Marland Kitchen sulfamethoxazole-trimethoprim (BACTRIM DS,SEPTRA DS) 800-160 MG tablet Take 1 tablet 2 (two) times daily by mouth.  . [DISCONTINUED] amLODipine (NORVASC) 2.5 MG tablet Take 1 tablet (2.5 mg total) by mouth daily.     No Known Allergies Past Medical History:  Diagnosis Date  . Gout   . Hyperlipidemia   . Hypertension   . PAF (paroxysmal atrial fibrillation) ([place]) 12/08/2016   No family history on file. Past Surgical History:  Procedure Laterality Date  . APPENDECTOMY LAPAROSCOPIC N/A 12/08/2016   Performed by [name], MD at [facility]S  . right thumb surgery     Social History   Socioeconomic History  . Marital status: Married    Spouse name: Not on file  . Number of children: Not on file  . Years of education: Not on file  . Highest education level: Not on file  Social Needs  . Financial resource strain: Not on file  . Food insecurity - worry: Not on file  . Food insecurity - inability: Not on file  . Transportation needs - medical: Not on file  . Transportation needs - non-medical: Not on file  Occupational History  . Not on file  Tobacco Use  . Smoking status: Former Research scientist (life sciences)  . Smokeless tobacco: Never Used  Substance and Sexual Activity  . Alcohol use: No  . Drug use: No  . Sexual activity: Not on file  Other Topics Concern  . Not on file  Social History Narrative   Originally from [place]     Review of Systems: General: negative for chills, fever, night sweats or weight changes.  Cardiovascular: negative for chest pain, dyspnea on exertion, edema, orthopnea, palpitations, paroxysmal nocturnal  dyspnea or shortness of breath Dermatological: negative for rash Respiratory: negative for cough or wheezing Urologic: negative for hematuria Abdominal: negative for nausea, vomiting, diarrhea, bright red blood per rectum, melena, or hematemesis Neurologic: negative for visual changes, syncope, or dizziness All other systems reviewed and are otherwise negative except as noted above.   Physical Exam:  Blood pressure 136/60, pulse (!) 53, height 5' (1.524 m), weight 136 lb 12.8 oz (62.1 kg), SpO2 97 %.  General appearance: alert, cooperative and no distress Neck: no carotid bruit  and no JVD Lungs: clear to auscultation bilaterally Heart: regular rate and rhythm, S1, S2 normal, no murmur, click, rub or gallop Extremities: extremities normal, atraumatic, no cyanosis or edema Pulses: 2+ and symmetric Skin: Skin color, texture, turgor normal. No rashes or lesions Neurologic: Grossly normal  EKG sinus brady 53 bpm -- personally reviewed    ECHO: 12/09/2016 - Left ventricle: The cavity size was normal. Wall thickness was increased in a pattern of mild LVH. Indeterminant diastolic function. The estimated ejection fraction was 55%. Wall motion was normal; there were no regional wall motion abnormalities. - Aortic valve: There was no stenosis. - Mitral valve: There was trivial regurgitation. - Left atrium: The atrium was severely dilated. - Right ventricle: The cavity size was normal. Systolic function was normal. - Right atrium: The atrium was mildly dilated. - Tricuspid valve: Peak RV-RA gradient (S): 24 mm Hg. - Pulmonary arteries: PA peak pressure: 32 mm Hg (S). - Systemic veins: IVC measured 2.2 cm with normal respirophasic variation, suggesting RA pressure 8 mmHg. Impressions: - Normal LV size with mild LV hypertrophy. EF 55%. Normal RV size and systolic function. Severe LAE.    ASSESSMENT AND PLAN:   1. Post-operative Atrial Fibrillation: episode lasted < 12 hrs. Spontaneous conversion back to NSR with Cardizem. No a/c initiated given arrhthymia duration < 12 hrs. He denies any symptoms of recurrent afib since discharge. EKG today shows sinus bradycardia in the 50s but asymptomatic w/ his bradycardia. Continue Cardizem 120 mg daily for continued rate control. Stop amlodipine. Continue lisinopril for HTN.   He has been instructed to call the office if he develops any symptoms of palpitations, dyspnea, chest discomfort, dizziness, fatigue, syncope/ near syncope. If so, we can place outpatient monitor to check for recurrence of afib. If he has any  recurrent afib, he will need anticoagulation given CHA2DS2 VASc score of 2 (age and HTN). For now, continue Cardizem. F/u in 6 months for repeat check-up.   2. HTN: controlled.   3. Appendicitis: s/p appendectomy.    Thurmond Hildebran Ladoris Gene, MHS [facility] 12/22/2016 1:47 PM

## 2017-02-23 DIAGNOSIS — I10 Essential (primary) hypertension: Secondary | ICD-10-CM | POA: Diagnosis not present

## 2017-02-23 DIAGNOSIS — M109 Gout, unspecified: Secondary | ICD-10-CM | POA: Diagnosis not present

## 2017-02-23 DIAGNOSIS — R7301 Impaired fasting glucose: Secondary | ICD-10-CM | POA: Diagnosis not present

## 2017-05-27 DIAGNOSIS — H34831 Tributary (branch) retinal vein occlusion, right eye, with macular edema: Secondary | ICD-10-CM | POA: Diagnosis not present

## 2017-06-01 ENCOUNTER — Ambulatory Visit (INDEPENDENT_AMBULATORY_CARE_PROVIDER_SITE_OTHER): Payer: Medicare HMO

## 2017-06-01 ENCOUNTER — Ambulatory Visit (INDEPENDENT_AMBULATORY_CARE_PROVIDER_SITE_OTHER): Payer: Medicare HMO | Admitting: Physician Assistant

## 2017-06-01 ENCOUNTER — Encounter: Payer: Self-pay | Admitting: Physician Assistant

## 2017-06-01 ENCOUNTER — Other Ambulatory Visit: Payer: Self-pay

## 2017-06-01 VITALS — BP 154/84 | HR 69 | Temp 98.6°F | Resp 18 | Ht 62.01 in | Wt 137.0 lb

## 2017-06-01 DIAGNOSIS — R066 Hiccough: Secondary | ICD-10-CM

## 2017-06-01 DIAGNOSIS — I10 Essential (primary) hypertension: Secondary | ICD-10-CM

## 2017-06-01 MED ORDER — OMEPRAZOLE 20 MG PO CPDR: 20.0000 mg | DELAYED_RELEASE_CAPSULE | Freq: Every day | ORAL | 0 refills | Status: DC

## 2017-06-01 MED ORDER — BACLOFEN 10 MG PO TABS: 10.0000 mg | ORAL_TABLET | Freq: Every day | ORAL | 0 refills | Status: DC

## 2017-06-01 NOTE — Patient Instructions (Addendum)
We have collected labs today and should have those results back within 1 week.  I recommend discontinuing the Thorazine and ranitidine at this time.  You can start baclofen once a day and omeprazole once daily.  If no improvement after 2 days, increase baclofen to twice daily.  If no improvement after 2 days with that dose, increase to 3 times a day.  Do not go beyond 3 times a day without contacting our office.  Return to clinic if symptoms worsen, do not improve, or as needed.  Hiccups A hiccup is the result of a sudden shortening of the muscle below your lungs (diaphragm). This movement of your diaphragm causes a sudden inhalation followed by the closing of your vocal cords, which causes the hiccup sound. Most people get the hiccups. Typically, hiccups last only a short amount of time. There are three types of hiccups:  Benign. These hiccups last less than 48 hours.  Persistent. These hiccups last more than 48 hours, but less than 1 month.  Intractable. These hiccups last more than 1 month.  A hiccup is a reflex. You cannot control reflexes. Follow these instructions at home: Watch your hiccups for any changes. The following actions may help to lessen any discomfort that you are feeling:  Eat small meals.  Limit alcohol intake to no more than 1 drink per day for nonpregnant women and 2 drinks per day for men. One drink equals 12 oz of beer, 5 oz of wine, or 1 oz of hard liquor.  Limit drinking carbonated or fizzy drinks, such as soda.  Eat and chew your food slowly.  Avoid eating or drinking hot or spicy foods and drinks.  Take medicines only as directed by your health care provider.  Contact a health care provider if:  Your hiccups last for more than 48 hours.  Your hiccups do not improve with treatment.  You cannot sleep or eat due to the hiccups.  You have unexpected weight loss due to the hiccups.  You have a fever.  You have trouble breathing or  swallowing.  You develop severe pain in your abdomen.  You develop numbness, tingling, or weakness. This information is not intended to replace advice given to you by your health care provider. Make sure you discuss any questions you have with your health care provider. Document Released: 03/31/2001 Document Revised: 06/28/2015 Document Reviewed: 01/16/2014 Elsevier Interactive Patient Education  2018 [employer].   IF you received an x-ray today, you will receive an invoice from [facility]. Please contact [facility] at [phone] with questions or concerns regarding your invoice.   IF you received labwork today, you will receive an invoice from [facility]. Please contact [facility] at [phone] with questions or concerns regarding your invoice.   Our billing staff will not be able to assist you with questions regarding bills from these companies.  You will be contacted with the lab results as soon as they are available. The fastest way to get your results is to activate your My Chart account. Instructions are located on the last page of this paperwork. If you have not heard from [place] regarding the results in 2 weeks, please contact this office.

## 2017-06-01 NOTE — Progress Notes (Signed)
Reginald Long  MRN: [id number] DOB: Nov 13, 1940  Subjective:  Reginald Long is a 77 y.o. male seen in office today for a chief complaint of hiccups x 2 weeks. Has had hx of episodes of persistent years x 30 years. Last episode was one year ago. Will typically cholorproxmazine 30m and ranitidine 3091mat night for relief when they occur but has been trying this since it started with no relief. He is hiccupping all day and night, interfering with his sleep. Will do it so often, it makes it hard to breathe sometimes. Will have occasional belching. Has lost 2 lbs over the past few weeks (had appendectomy 12/2016). Has also tried holding breath, gargling water, and honey with no relief.   Denies fever, chills, cough, heartburn, chest pain, abdominal pain, night sweats, diaphoresis, sore throat.  Denies smoking.  Has PMH of HTN, takes diltiazem and lisinopril daily and Level 4 melanoma right thumb. No PMH diabetes, heart disease, thyroid disease or GERD that he is aware of. Of note, pt's grandson is here to help translate.   Review of Systems  HENT: Negative for congestion, ear pain and sinus pressure.   Respiratory: Negative for wheezing.   Cardiovascular: Negative for palpitations and leg swelling.  Gastrointestinal: Negative for abdominal pain.  Neurological: Negative for dizziness, weakness, light-headedness and headaches.    Patient Active Problem List   Diagnosis Date Noted  . Acute appendicitis 12/08/2016  . Non-English speaking patient ([facility]11/06/2016  . Right renal mass 12/08/2016  . CKD (chronic kidney disease), stage III ([facility]11/06/2016  . PAF (paroxysmal atrial fibrillation) ([place] [place]/06/2016  . Acute gangrenous appendicitis with perforation and peritonitis 12/08/2016  . Viral enteritis 12/04/2015  . Hyperlipidemia 12/04/2015  . Essential hypertension 12/04/2015  . Right foot pain 12/04/2015  . Acute gout of right ankle 12/04/2015  . Melanoma of skin, site unspecified 10/26/2013     Current Outpatient Medications on File Prior to Visit  Medication Sig Dispense Refill  . chlorproMAZINE (THORAZINE) 25 MG tablet Take 25 mg by mouth 2 (two) times daily as needed.    . colchicine 0.6 MG tablet Take two tablets at onset of flare up then 0.24m61mne hour later. (Patient taking differently: Take 0.6 mg 2 (two) times daily as needed by mouth (Gout). ) 15 tablet 2  . diclofenac sodium (VOLTAREN) 1 % GEL Apply 4 g topically 4 (four) times daily. 100 g 2  . lisinopril (PRINIVIL,ZESTRIL) 20 MG tablet Take 1 tablet (20 mg total) by mouth daily. 90 tablet 1  . ranitidine (ZANTAC) 150 MG/10ML syrup Take by mouth 2 (two) times daily.    . sMarland Kitchenlfamethoxazole-trimethoprim (BACTRIM DS,SEPTRA DS) 800-160 MG tablet Take 1 tablet 2 (two) times daily by mouth. 20 tablet 1  . diltiazem (CARDIZEM CD) 120 MG 24 hr capsule Take 1 capsule (120 mg total) daily by mouth. (Patient not taking: Reported on 06/01/2017) 30 capsule 1   No current facility-administered medications on file prior to visit.     No Known Allergies    Social History   Socioeconomic History  . Marital status: Married    Spouse name: Not on file  . Number of children: Not on file  . Years of education: Not on file  . Highest education level: Not on file  Occupational History  . Not on file  Social Needs  . Financial resource strain: Not on file  . Food insecurity:    Worry: Not on file    Inability: Not on file  .  Transportation needs:    Medical: Not on file    Non-medical: Not on file  Tobacco Use  . Smoking status: Former Research scientist (life sciences)  . Smokeless tobacco: Never Used  Substance and Sexual Activity  . Alcohol use: No  . Drug use: No  . Sexual activity: Not on file  Lifestyle  . Physical activity:    Days per week: Not on file    Minutes per session: Not on file  . Stress: Not on file  Relationships  . Social connections:    Talks on phone: Not on file    Gets together: Not on file    Attends religious service:  Not on file    Active member of club or organization: Not on file    Attends meetings of clubs or organizations: Not on file    Relationship status: Not on file  . Intimate partner violence:    Fear of current or ex partner: Not on file    Emotionally abused: Not on file    Physically abused: Not on file    Forced sexual activity: Not on file  Other Topics Concern  . Not on file  Social History Narrative   Originally from [place]    Objective:  BP (!) 154/84 (BP Location: Left Arm, Patient Position: Sitting, Cuff Size: Normal)   Pulse 69   Temp 98.6 F (37 C) (Oral)   Resp 18   Ht 5' 2.01" (1.575 m)   Wt 137 lb (62.1 kg)   SpO2 97%   BMI 25.05 kg/m   Physical Exam  Constitutional: He is oriented to person, place, and time. He appears well-developed and well-nourished.  Multiple hiccups noted during office visit.  HENT:  Head: Normocephalic and atraumatic.  Eyes: Conjunctivae are normal.  Neck: Normal range of motion.  Cardiovascular: Normal rate, regular rhythm and normal heart sounds.  Pulmonary/Chest: Effort normal and breath sounds normal. He has no decreased breath sounds. He has no wheezes. He has no rhonchi. He has no rales.  Neurological: He is alert and oriented to person, place, and time.  Skin: Skin is warm and dry.  Psychiatric: He has a normal mood and affect.  Vitals reviewed.  Dg Chest 2 View  Result Date: 06/01/2017 CLINICAL DATA:  Hiccups for 2 weeks EXAM: CHEST - 2 VIEW COMPARISON:  12/08/2016 FINDINGS: Borderline heart size. Stable mediastinal contours. There is no edema, consolidation, effusion, or pneumothorax. Bone island versus calcified pulmonary nodule over the left mid chest. IMPRESSION: No evidence of active disease. Electronically Signed   By: Monte Fantasia M.D.   On: 06/01/2017 18:49   Wt Readings from Last 3 Encounters:  06/01/17 137 lb (62.1 kg)  12/22/16 136 lb 12.8 oz (62.1 kg)  12/10/16 148 lb 13 oz (67.5 kg)   Valsalva maneuver  attempted with no resolution of hiccups.   Assessment and Plan :  1. Hiccups Unclear etiology at this time.  Chest x-ray with no active disease.  Labs pending.  Recommended discontinue cholorproxmazine and ranitidine.  Start baclofen and Prilosec. Educated on dosing. Advised to return to clinic if symptoms worsen, do not improve, or as needed.  - DG Chest 2 View; Future - CMP14+EGFR - CBC with Differential/Platelet - Lipase  2. Essential hypertension Uncontrolled at this time. Could be due to lack of sleep from persistent hiccups. He is otherwise asymptomatic. Instructed to check bp outside of office over the next couple of weeks. Return if consistently >140/90. Given strict ED precautions.  Meds ordered this encounter  Medications  . baclofen (LIORESAL) 10 MG tablet    Sig: Take 1 tablet (10 mg total) by mouth daily.    Dispense:  30 each    Refill:  0    Order Specific Question:   Supervising Provider    Answer:   Wardell Honour [[id number]]  . omeprazole (PRILOSEC) 20 MG capsule    Sig: Take 1 capsule (20 mg total) by mouth daily.    Dispense:  30 capsule    Refill:  0    Order Specific Question:   Supervising Provider    Answer:   Reginia Forts M [[id number]]      Tenna Delaine PA-C  [facility] 06/01/2017 6:52 PM

## 2017-06-02 LAB — CMP14+EGFR
ALT: 39 IU/L (ref 0–44)
AST: 30 IU/L (ref 0–40)
Albumin/Globulin Ratio: 1.5 (ref 1.2–2.2)
Albumin: 4.1 g/dL (ref 3.5–4.8)
Alkaline Phosphatase: 84 IU/L (ref 39–117)
BUN/Creatinine Ratio: 17 (ref 10–24)
BUN: 19 mg/dL (ref 8–27)
Bilirubin Total: 0.2 mg/dL (ref 0.0–1.2)
CALCIUM: 9.5 mg/dL (ref 8.6–10.2)
CO2: 22 mmol/L (ref 20–29)
CREATININE: 1.1 mg/dL (ref 0.76–1.27)
Chloride: 108 mmol/L — ABNORMAL HIGH (ref 96–106)
GFR, EST AFRICAN AMERICAN: 75 mL/min/{1.73_m2} (ref >59)
GFR, EST NON AFRICAN AMERICAN: 65 mL/min/{1.73_m2} (ref >59)
GLOBULIN, TOTAL: 2.8 g/dL (ref 1.5–4.5)
GLUCOSE: 107 mg/dL — AB (ref 65–99)
Potassium: 4.8 mmol/L (ref 3.5–5.2)
SODIUM: 142 mmol/L (ref 134–144)
TOTAL PROTEIN: 6.9 g/dL (ref 6.0–8.5)

## 2017-06-02 LAB — CBC WITH DIFFERENTIAL/PLATELET
Basophils Absolute: 0 10*3/uL (ref 0.0–0.2)
Basos: 0 %
EOS (ABSOLUTE): 0.5 10*3/uL — AB (ref 0.0–0.4)
EOS: 7 %
HEMATOCRIT: 45.2 % (ref 37.5–51.0)
Hemoglobin: 14.9 g/dL (ref 13.0–17.7)
IMMATURE GRANULOCYTES: 0 %
Immature Grans (Abs): 0 10*3/uL (ref 0.0–0.1)
Lymphocytes Absolute: 1.7 10*3/uL (ref 0.7–3.1)
Lymphs: 25 %
MCH: 29.8 pg (ref 26.6–33.0)
MCHC: 33 g/dL (ref 31.5–35.7)
MCV: 90 fL (ref 79–97)
MONOS ABS: 0.5 10*3/uL (ref 0.1–0.9)
Monocytes: 7 %
NEUTROS ABS: 4.1 10*3/uL (ref 1.4–7.0)
NEUTROS PCT: 61 %
PLATELETS: 195 10*3/uL (ref 150–379)
RBC: 5 x10E6/uL (ref 4.14–5.80)
RDW: 14.5 % (ref 12.3–15.4)
WBC: 6.8 10*3/uL (ref 3.4–10.8)

## 2017-06-02 LAB — LIPASE: Lipase: 53 U/L (ref 13–78)

## 2017-07-01 DIAGNOSIS — H34831 Tributary (branch) retinal vein occlusion, right eye, with macular edema: Secondary | ICD-10-CM | POA: Diagnosis not present

## 2017-07-01 DIAGNOSIS — H348192 Central retinal vein occlusion, unspecified eye, stable: Secondary | ICD-10-CM | POA: Diagnosis not present

## 2017-07-27 DIAGNOSIS — L02611 Cutaneous abscess of right foot: Secondary | ICD-10-CM | POA: Diagnosis not present

## 2017-07-27 DIAGNOSIS — L03115 Cellulitis of right lower limb: Secondary | ICD-10-CM | POA: Diagnosis not present

## 2017-07-27 DIAGNOSIS — Z789 Other specified health status: Secondary | ICD-10-CM | POA: Diagnosis not present

## 2017-07-27 DIAGNOSIS — S91301A Unspecified open wound, right foot, initial encounter: Secondary | ICD-10-CM | POA: Diagnosis not present

## 2017-08-14 DIAGNOSIS — H34831 Tributary (branch) retinal vein occlusion, right eye, with macular edema: Secondary | ICD-10-CM | POA: Diagnosis not present

## 2017-10-18 DIAGNOSIS — R69 Illness, unspecified: Secondary | ICD-10-CM | POA: Diagnosis not present

## 2018-01-20 ENCOUNTER — Encounter: Payer: Self-pay | Admitting: Family Medicine

## 2018-01-20 ENCOUNTER — Other Ambulatory Visit: Payer: Self-pay

## 2018-01-20 ENCOUNTER — Ambulatory Visit (INDEPENDENT_AMBULATORY_CARE_PROVIDER_SITE_OTHER): Payer: Medicare HMO | Admitting: Family Medicine

## 2018-01-20 VITALS — BP 170/81 | HR 60 | Temp 98.3°F | Resp 18 | Ht 62.01 in | Wt 132.8 lb

## 2018-01-20 DIAGNOSIS — Z23 Encounter for immunization: Secondary | ICD-10-CM

## 2018-01-20 DIAGNOSIS — Z8739 Personal history of other diseases of the musculoskeletal system and connective tissue: Secondary | ICD-10-CM

## 2018-01-20 DIAGNOSIS — I10 Essential (primary) hypertension: Secondary | ICD-10-CM | POA: Diagnosis not present

## 2018-01-20 MED ORDER — LISINOPRIL 20 MG PO TABS: 20.0000 mg | ORAL_TABLET | Freq: Every day | ORAL | 0 refills | Status: DC

## 2018-01-20 MED ORDER — COLCHICINE 0.6 MG PO TABS: ORAL_TABLET | ORAL | 2 refills | Status: DC

## 2018-01-20 NOTE — Progress Notes (Signed)
Subjective:    Patient ID: Reginald Long, male    DOB: 1940-05-16, 77 y.o.   MRN: [id number]  HPI Reginald Long is a 77 y.o. male Presents today for: Chief Complaint  Patient presents with  . Gout    left knee and sometimes feet x3weeks needs refill on meds    Out of all medicines.  Needs refills. PCP - saw [place] in past.   Here with dtr in law - interpreting today. Offered video interpreter - declined.   Gout: Colchicine only when has gout flare. Had been recommended in past to take every day.  L knee flair 3 weeks ago, better now  L ankle flair "sometimes" Gout flares every 3-4 weeks.   Lab Results  Component Value Date   LABURIC CANCELED 03/18/2016   Hypertension: BP Readings from Last 3 Encounters:  01/20/18 (!) 170/81  06/01/17 (!) 154/84  12/22/16 136/60   Lab Results  Component Value Date   CREATININE 1.10 06/01/2017  out of lisinopril for past week. Denies side effects on meds.  Slight dypnea lying dow a few nights ago, not now. No chest pains.   HM - due for PRevnar.    Patient Active Problem List   Diagnosis Date Noted  . Acute appendicitis 12/08/2016  . Non-English speaking patient Reginald Long) 12/08/2016  . Right renal mass 12/08/2016  . CKD (chronic kidney disease), stage III ([place]) 12/08/2016  . PAF (paroxysmal atrial fibrillation) ([place]) 12/08/2016  . Acute gangrenous appendicitis with perforation and peritonitis 12/08/2016  . Viral enteritis 12/04/2015  . Hyperlipidemia 12/04/2015  . Essential hypertension 12/04/2015  . Right foot pain 12/04/2015  . Acute gout of right ankle 12/04/2015  . Melanoma of skin, site unspecified 10/26/2013   Past Medical History:  Diagnosis Date  . Gout   . Hyperlipidemia   . Hypertension   . PAF (paroxysmal atrial fibrillation) ([place]) 12/08/2016   Past Surgical History:  Procedure Laterality Date  . LAPAROSCOPIC APPENDECTOMY N/A 12/08/2016   Procedure: APPENDECTOMY LAPAROSCOPIC;  Surgeon: [name], MD;  Location:  [facility];  Service: General;  Laterality: N/A;  . right thumb surgery     No Known Allergies Prior to Admission medications   Medication Sig Start Date End Date Taking? Authorizing Provider  chlorproMAZINE (THORAZINE) 25 MG tablet Take 25 mg by mouth 2 (two) times daily as needed.   Yes [provider]  diltiazem (CARDIZEM CD) 120 MG 24 hr capsule Take 1 capsule (120 mg total) daily by mouth. 12/11/16  Yes Rayburn, Floyce Stakes, PA-C  baclofen (LIORESAL) 10 MG tablet Take 1 tablet (10 mg total) by mouth daily. Patient not taking: Reported on 01/20/2018 06/01/17   Tenna Delaine D, PA-C  colchicine 0.6 MG tablet Take two tablets at onset of flare up then 0.6mg  one hour later. Patient not taking: Reported on 01/20/2018 06/16/16   Tenna Delaine D, PA-C  diclofenac sodium (VOLTAREN) 1 % GEL Apply 4 g topically 4 (four) times daily. Patient not taking: Reported on 01/20/2018 03/18/16   Tenna Delaine D, PA-C  lisinopril (PRINIVIL,ZESTRIL) 20 MG tablet Take 1 tablet (20 mg total) by mouth daily. Patient not taking: Reported on 01/20/2018 06/16/16   Leonie Douglas, PA-C   Social History   Socioeconomic History  . Marital status: Married    Spouse name: Not on file  . Number of children: Not on file  . Years of education: Not on file  . Highest education level: Not on file  Occupational History  .  Not on file  Social Needs  . Financial resource strain: Not on file  . Food insecurity:    Worry: Not on file    Inability: Not on file  . Transportation needs:    Medical: Not on file    Non-medical: Not on file  Tobacco Use  . Smoking status: Former Research scientist (life sciences)  . Smokeless tobacco: Never Used  Substance and Sexual Activity  . Alcohol use: No  . Drug use: No  . Sexual activity: Not on file  Lifestyle  . Physical activity:    Days per week: Not on file    Minutes per session: Not on file  . Stress: Not on file  Relationships  . Social connections:    Talks on phone: Not on  file    Gets together: Not on file    Attends religious service: Not on file    Active member of club or organization: Not on file    Attends meetings of clubs or organizations: Not on file    Relationship status: Not on file  . Intimate partner violence:    Fear of current or ex partner: Not on file    Emotionally abused: Not on file    Physically abused: Not on file    Forced sexual activity: Not on file  Other Topics Concern  . Not on file  Social History Narrative   Originally from [place]    Review of Systems Per HPI.     Objective:   Physical Exam Vitals signs reviewed.  Constitutional:      Appearance: He is well-developed.  HENT:     Head: Normocephalic and atraumatic.  Eyes:     Pupils: Pupils are equal, round, and reactive to light.  Neck:     Vascular: No carotid bruit or JVD.  Cardiovascular:     Rate and Rhythm: Normal rate and regular rhythm.     Heart sounds: Normal heart sounds. No murmur.  Pulmonary:     Effort: Pulmonary effort is normal.     Breath sounds: Normal breath sounds. No rales.  Musculoskeletal: Normal range of motion.        General: No swelling.     Comments: No apparent effusion or erythema of knees or ankles at this time  Skin:    General: Skin is warm and dry.  Neurological:     Mental Status: He is alert and oriented to person, place, and time.    Vitals:   01/20/18 1734  BP: (!) 170/81  Pulse: 60  Resp: 18  Temp: 98.3 F (36.8 C)  TempSrc: Oral  SpO2: 96%  Weight: 132 lb 12.8 oz (60.2 kg)  Height: 5' 2.01" (1.575 m)       Assessment & Plan:   Reginald Long is a 77 y.o. male Essential hypertension - Plan: lisinopril (PRINIVIL,ZESTRIL) 20 MG tablet, Basic metabolic panel  -Elevated off meds, asymptomatic, restart lisinopril previous dose, check BMP.  Recheck 2 weeks on meds  History of gout - Plan: colchicine 0.6 MG tablet, Uric Acid  -Recent flare, but has improved.  Will check uric acid as not in current flare,  colchicine provided for as needed use, recheck 2 weeks to discuss lab results and consider allopurinol for prevention.  BMP ordered.  Need for pneumococcal vaccination - Plan: Pneumococcal conjugate vaccine 13-valent IM given    Meds ordered this encounter  Medications  . lisinopril (PRINIVIL,ZESTRIL) 20 MG tablet    Sig: Take 1 tablet (20 mg total) by  mouth daily.    Dispense:  90 tablet    Refill:  0  . colchicine 0.6 MG tablet    Sig: Take two tablets at onset of flare up then 0.6mg  one hour later.    Dispense:  15 tablet    Refill:  2   Patient Instructions   Restart lisinopril at same dose, colchicine if needed for gout flare.  I am checking a uric acid or gout test as you may need to be on a daily medication.  Please follow-up in the next 2 weeks to recheck blood pressure and discuss results of gout test.    If any return of shortness of breath at night please be seen in the emergency room.  Return to the clinic or go to the nearest emergency room if any of your symptoms worsen or new symptoms occur.     If you have lab work done today you will be contacted with your lab results within the next 2 weeks.  If you have not heard from [place] then please contact us. The fastest way to get your results is to register for My Chart.   IF you received an x-ray today, you will receive an invoice from [facility]. Please contact [facility] at [phone] with questions or concerns regarding your invoice.   IF you received labwork today, you will receive an invoice from [place]. Please contact [facility] at [phone] with questions or concerns regarding your invoice.   Our billing staff will not be able to assist you with questions regarding bills from these companies.  You will be contacted with the lab results as soon as they are available. The fastest way to get your results is to activate your My Chart account. Instructions are located on the last page of this  paperwork. If you have not heard from [place] regarding the results in 2 weeks, please contact this office.      Signed,   Merri Ray, MD [facility].  01/22/18 11:51 AM

## 2018-01-20 NOTE — Patient Instructions (Addendum)
Restart lisinopril at same dose, colchicine if needed for gout flare.  I am checking a uric acid or gout test as you may need to be on a daily medication.  Please follow-up in the next 2 weeks to recheck blood pressure and discuss results of gout test.    If any return of shortness of breath at night please be seen in the emergency room.  Return to the clinic or go to the nearest emergency room if any of your symptoms worsen or new symptoms occur.     If you have lab work done today you will be contacted with your lab results within the next 2 weeks.  If you have not heard from [place] then please contact us. The fastest way to get your results is to register for My Chart.   IF you received an x-ray today, you will receive an invoice from [facility]. Please contact [facility] at [phone] with questions or concerns regarding your invoice.   IF you received labwork today, you will receive an invoice from [place]. Please contact [facility] at [phone] with questions or concerns regarding your invoice.   Our billing staff will not be able to assist you with questions regarding bills from these companies.  You will be contacted with the lab results as soon as they are available. The fastest way to get your results is to activate your My Chart account. Instructions are located on the last page of this paperwork. If you have not heard from [place] regarding the results in 2 weeks, please contact this office.

## 2018-01-21 LAB — BASIC METABOLIC PANEL
BUN / CREAT RATIO: 15 (ref 10–24)
BUN: 19 mg/dL (ref 8–27)
CHLORIDE: 105 mmol/L (ref 96–106)
CO2: 21 mmol/L (ref 20–29)
CREATININE: 1.28 mg/dL — AB (ref 0.76–1.27)
Calcium: 9.7 mg/dL (ref 8.6–10.2)
GFR calc Af Amer: 62 mL/min/{1.73_m2} (ref >59)
GFR calc non Af Amer: 54 mL/min/{1.73_m2} — ABNORMAL LOW (ref >59)
GLUCOSE: 96 mg/dL (ref 65–99)
Potassium: 4.8 mmol/L (ref 3.5–5.2)
Sodium: 142 mmol/L (ref 134–144)

## 2018-01-21 LAB — URIC ACID: URIC ACID: 9.6 mg/dL — AB (ref 3.7–8.6)

## 2018-02-04 ENCOUNTER — Encounter: Payer: Self-pay | Admitting: Radiology

## 2018-02-15 ENCOUNTER — Ambulatory Visit: Payer: Medicare HMO | Admitting: Family Medicine

## 2018-06-24 ENCOUNTER — Other Ambulatory Visit: Payer: Self-pay | Admitting: Family Medicine

## 2018-06-24 DIAGNOSIS — I10 Essential (primary) hypertension: Secondary | ICD-10-CM

## 2018-06-24 NOTE — Telephone Encounter (Signed)
Spoke with grandson - he will talk with pt. And schedule an OV.

## 2018-08-30 ENCOUNTER — Ambulatory Visit (INDEPENDENT_AMBULATORY_CARE_PROVIDER_SITE_OTHER): Payer: Medicare HMO

## 2018-08-30 ENCOUNTER — Other Ambulatory Visit: Payer: Self-pay

## 2018-08-30 ENCOUNTER — Encounter: Payer: Self-pay | Admitting: Registered Nurse

## 2018-08-30 ENCOUNTER — Ambulatory Visit (INDEPENDENT_AMBULATORY_CARE_PROVIDER_SITE_OTHER): Payer: Medicare HMO | Admitting: Registered Nurse

## 2018-08-30 VITALS — BP 132/70 | HR 60 | Temp 98.1°F | Resp 16 | Wt 137.0 lb

## 2018-08-30 DIAGNOSIS — M1712 Unilateral primary osteoarthritis, left knee: Secondary | ICD-10-CM | POA: Diagnosis not present

## 2018-08-30 MED ORDER — PREDNISONE 10 MG PO TABS: ORAL_TABLET | ORAL | 0 refills | Status: AC

## 2018-08-30 NOTE — Patient Instructions (Signed)
° ° ° °  If you have lab work done today you will be contacted with your lab results within the next 2 weeks.  If you have not heard from us then please contact us. The fastest way to get your results is to register for My Chart. ° ° °IF you received an x-ray today, you will receive an invoice from [facility]. Please contact  Radiology at [phone] with questions or concerns regarding your invoice.  ° °IF you received labwork today, you will receive an invoice from [facility]. Please contact [facility] at [phone] with questions or concerns regarding your invoice.  ° °Our billing staff will not be able to assist you with questions regarding bills from these companies. ° °You will be contacted with the lab results as soon as they are available. The fastest way to get your results is to activate your My Chart account. Instructions are located on the last page of this paperwork. If you have not heard from us regarding the results in 2 weeks, please contact this office. °  ° ° ° °

## 2018-08-30 NOTE — Progress Notes (Signed)
Established Patient Office Visit  Subjective:  Patient ID: Reginald Long, male    DOB: 26-Feb-1940  Age: 78 y.o. MRN: [id number]  CC:  Chief Complaint  Patient presents with  . Gout    pain for months need refill on medication   . Knee Pain    with swelling x 1 month ( left)    HPI Reginald Long presents for pain in L knee x 1 mo  Pt states that he has been feeling fine in previous areas of gout - great toes and ankles - but has noticed his L knee has been swollen and painful x 1 month. He states in the past week, it has become increasingly worse. He states activity worsens his pain. He states it is worst on the medial side, denies patellar, lateral, femoral, or tibial pain. States he has not taken colchicine, as he has run out. Unfortunately, he did not follow up after his last visit with dr. Carlota Raspberry, as such, is not on a daily gout medication.  Denies associated systemic symptoms  Past Medical History:  Diagnosis Date  . Gout   . Hyperlipidemia   . Hypertension   . PAF (paroxysmal atrial fibrillation) ([place]) 12/08/2016    Past Surgical History:  Procedure Laterality Date  . LAPAROSCOPIC APPENDECTOMY N/A 12/08/2016   Procedure: APPENDECTOMY LAPAROSCOPIC;  Surgeon: [name], MD;  Location: [facility];  Service: General;  Laterality: N/A;  . right thumb surgery      History reviewed. No pertinent family history.  Social History   Socioeconomic History  . Marital status: Married    Spouse name: Not on file  . Number of children: Not on file  . Years of education: Not on file  . Highest education level: Not on file  Occupational History  . Not on file  Social Needs  . Financial resource strain: Not on file  . Food insecurity    Worry: Not on file    Inability: Not on file  . Transportation needs    Medical: Not on file    Non-medical: Not on file  Tobacco Use  . Smoking status: Former Research scientist (life sciences)  . Smokeless tobacco: Never Used  Substance and Sexual Activity  . Alcohol use: No   . Drug use: No  . Sexual activity: Not on file  Lifestyle  . Physical activity    Days per week: Not on file    Minutes per session: Not on file  . Stress: Not on file  Relationships  . Social Herbalist on phone: Not on file    Gets together: Not on file    Attends religious service: Not on file    Active member of club or organization: Not on file    Attends meetings of clubs or organizations: Not on file    Relationship status: Not on file  . Intimate partner violence    Fear of current or ex partner: Not on file    Emotionally abused: Not on file    Physically abused: Not on file    Forced sexual activity: Not on file  Other Topics Concern  . Not on file  Social History Narrative   Originally from [place]    Outpatient Medications Prior to Visit  Medication Sig Dispense Refill  . colchicine 0.6 MG tablet Take two tablets at onset of flare up then 0.6mg  one hour later. 15 tablet 2  . lisinopril (ZESTRIL) 20 MG tablet TAKE 1 TABLET(20 MG) BY MOUTH DAILY  90 tablet 0  . lovastatin (MEVACOR) 20 MG tablet Take by mouth.    . meloxicam (MOBIC) 7.5 MG tablet TK 1 T PO  QD    . silver sulfADIAZINE (SILVADENE) 1 % cream Apply topically.    . sulfamethoxazole-trimethoprim (BACTRIM DS) 800-160 MG tablet Take by mouth.    . traMADol (ULTRAM) 50 MG tablet TK 1 T PO  BID PRN FOR PAIN    . lisinopril (ZESTRIL) 20 MG tablet Take by mouth.     No facility-administered medications prior to visit.     No Known Allergies  ROS Review of Systems Per hpi   Objective:    Physical Exam  Constitutional: He is oriented to person, place, and time. He appears well-developed and well-nourished. No distress.  Cardiovascular: Normal rate and regular rhythm.  Pulmonary/Chest: Effort normal. No respiratory distress.  Musculoskeletal:        General: Tenderness present.     Comments: L knee medial swelling, TTP, L knee can fully extend but flexion limited to 90 deg. Painful to  attempt beyond this, active or passive. Weakness to 3/5 in flexion and extension of L knee.  Neurological: He is alert and oriented to person, place, and time.  Skin: Skin is warm and dry. He is not diaphoretic. No erythema.  Psychiatric: He has a normal mood and affect. His behavior is normal. Judgment and thought content normal.    BP 132/70   Pulse 60   Temp 98.1 F (36.7 C) (Oral)   Resp 16   Wt 137 lb (62.1 kg)   SpO2 98%   BMI 25.05 kg/m  Wt Readings from Last 3 Encounters:  08/30/18 137 lb (62.1 kg)  01/20/18 132 lb 12.8 oz (60.2 kg)  06/01/17 137 lb (62.1 kg)     Health Maintenance Due  Topic Date Due  . TETANUS/TDAP  04/06/1960    There are no preventive care reminders to display for this patient.  No results found for: TSH Lab Results  Component Value Date   WBC 6.8 06/01/2017   HGB 14.9 06/01/2017   HCT 45.2 06/01/2017   MCV 90 06/01/2017   PLT 195 06/01/2017   Lab Results  Component Value Date   NA 142 01/20/2018   K 4.8 01/20/2018   CO2 21 01/20/2018   GLUCOSE 96 01/20/2018   BUN 19 01/20/2018   CREATININE 1.28 (H) 01/20/2018   BILITOT 0.2 06/01/2017   ALKPHOS 84 06/01/2017   AST 30 06/01/2017   ALT 39 06/01/2017   PROT 6.9 06/01/2017   ALBUMIN 4.1 06/01/2017   CALCIUM 9.7 01/20/2018   ANIONGAP 9 12/10/2016   Lab Results  Component Value Date   CHOL 266 (H) 12/04/2015   Lab Results  Component Value Date   HDL 75 12/04/2015   Lab Results  Component Value Date   LDLCALC 177 (H) 12/04/2015   Lab Results  Component Value Date   TRIG 69 12/04/2015   Lab Results  Component Value Date   CHOLHDL 3.5 12/04/2015   No results found for: HGBA1C    Assessment & Plan:   Problem List Items Addressed This Visit    None    Visit Diagnoses    Osteoarthritis of left knee, unspecified osteoarthritis type    -  Primary   Relevant Medications   meloxicam (MOBIC) 7.5 MG tablet   traMADol (ULTRAM) 50 MG tablet   predniSONE (DELTASONE) 10 MG  tablet   Other Relevant Orders   DG Knee Complete  4 Views Left      Meds ordered this encounter  Medications  . predniSONE (DELTASONE) 10 MG tablet    Sig: Take 3 tablets (30 mg total) by mouth daily with breakfast for 2 days, THEN 2 tablets (20 mg total) daily with breakfast for 2 days, THEN 1 tablet (10 mg total) daily with breakfast for 2 days, THEN 0.5 tablets (5 mg total) daily with breakfast for 2 days.    Dispense:  13 tablet    Refill:  0    Order Specific Question:   Supervising Provider    Answer:   Forrest Moron [id number]    Follow-up: No follow-ups on file.   PLAN:  OA vs Gout flare, I feel it is not likely a septic joint at this time given patient's history and presentation. Will give 8 day prednisone taper - kidney function has remained steady and BP well controlled.  Given strict precautions for returning to clinic - if pain worsens or doesn't resolve with prednisone, he will return.  Otherwise, return in 6 weeks to start Allopurinol daily for hyperuricemia management with myself or PCP Dr Carlota Raspberry.  Xray shows some degenerative changes. Will control gout and consider referrals from there.  Patient encouraged to call clinic with any questions, comments, or concerns.   Maximiano Coss, NP

## 2018-10-13 ENCOUNTER — Other Ambulatory Visit: Payer: Self-pay | Admitting: Family Medicine

## 2018-10-13 ENCOUNTER — Ambulatory Visit (INDEPENDENT_AMBULATORY_CARE_PROVIDER_SITE_OTHER): Payer: Medicare HMO | Admitting: Family Medicine

## 2018-10-13 ENCOUNTER — Other Ambulatory Visit: Payer: Self-pay

## 2018-10-13 VITALS — BP 160/70 | HR 50 | Temp 97.6°F | Ht 62.0 in | Wt 135.4 lb

## 2018-10-13 DIAGNOSIS — E785 Hyperlipidemia, unspecified: Secondary | ICD-10-CM | POA: Diagnosis not present

## 2018-10-13 DIAGNOSIS — Z8739 Personal history of other diseases of the musculoskeletal system and connective tissue: Secondary | ICD-10-CM | POA: Diagnosis not present

## 2018-10-13 DIAGNOSIS — I10 Essential (primary) hypertension: Secondary | ICD-10-CM | POA: Diagnosis not present

## 2018-10-13 MED ORDER — COLCHICINE 0.6 MG PO TABS: ORAL_TABLET | ORAL | 2 refills | Status: DC

## 2018-10-13 MED ORDER — LISINOPRIL 20 MG PO TABS: ORAL_TABLET | ORAL | 1 refills | Status: DC

## 2018-10-13 NOTE — Progress Notes (Signed)
Subjective:    Patient ID: Reginald Long, male    DOB: 1940/02/13, 78 y.o.   MRN: [id number]  HPI  Reginald Long is a 78 y.o. male Presents today for: Chief Complaint  Patient presents with  . Gout    f/u from july   . Medication Management    and needs med refills    Interpreter [phone]  Gout: Last flare: treated by Kathrin Ruddy in July in knee, treated with prednisone, tramadol.  mobic.  Recommended 6-week follow-up Daily meds: none.  Prn med: colchicine Lab Results  Component Value Date   LABURIC 9.6 (H) 01/20/2018   Hypertension: BP Readings from Last 3 Encounters:  10/13/18 (!) 160/70  08/30/18 132/70  01/20/18 (!) 170/81   Lab Results  Component Value Date   CREATININE 1.28 (H) 01/20/2018  taking lisinopril 20mg  qd. Has not yet taken today. Occasional missed dose. Did take yesterday. Home readings reportedly normal.  Did not take meds today as wanted to make sure we saw his blood pressure is elevated off meds.  Hyperlipidemia: No current meds. Prior on lovastatin 20mg  qd. No recent use - unknown reason.  Lab Results  Component Value Date   Long 266 (H) 12/04/2015   HDL 75 12/04/2015   LDLCALC 177 (H) 12/04/2015   TRIG 69 12/04/2015   CHOLHDL 3.5 12/04/2015   Lab Results  Component Value Date   ALT 39 06/01/2017   AST 30 06/01/2017   ALKPHOS 84 06/01/2017   BILITOT 0.2 06/01/2017    Patient Active Problem List   Diagnosis Date Noted  . Acute appendicitis 12/08/2016  . Non-English speaking patient Reginald Long) 12/08/2016  . Right renal mass 12/08/2016  . CKD (chronic kidney disease), stage III ([place]) 12/08/2016  . PAF (paroxysmal atrial fibrillation) ([place]) 12/08/2016  . Acute gangrenous appendicitis with perforation and peritonitis 12/08/2016  . Viral enteritis 12/04/2015  . Hyperlipidemia 12/04/2015  . Essential hypertension 12/04/2015  . Right foot pain 12/04/2015  . Acute gout of right ankle 12/04/2015  . Melanoma of skin, site unspecified 10/26/2013    Past Medical History:  Diagnosis Date  . Gout   . Hyperlipidemia   . Hypertension   . PAF (paroxysmal atrial fibrillation) ([place]) 12/08/2016   Past Surgical History:  Procedure Laterality Date  . LAPAROSCOPIC APPENDECTOMY N/A 12/08/2016   Procedure: APPENDECTOMY LAPAROSCOPIC;  Surgeon: [name], MD;  Location: [facility];  Service: General;  Laterality: N/A;  . right thumb surgery     No Known Allergies Prior to Admission medications   Medication Sig Start Date End Date Taking? Authorizing Provider  colchicine 0.6 MG tablet Take two tablets at onset of flare up then 0.6mg  one hour later. 01/20/18  Yes Wendie Agreste, MD  lisinopril (ZESTRIL) 20 MG tablet TAKE 1 TABLET(20 MG) BY MOUTH DAILY 06/24/18  Yes Wendie Agreste, MD  predniSONE (DELTASONE) 10 MG tablet Take 10 mg by mouth daily with breakfast.   Yes [provider]  lovastatin (MEVACOR) 20 MG tablet Take by mouth.    [provider]  meloxicam (MOBIC) 7.5 MG tablet TK 1 T PO  QD 08/05/18   [provider]  silver sulfADIAZINE (SILVADENE) 1 % cream Apply topically. 07/28/17   [provider]  traMADol (ULTRAM) 50 MG tablet TK 1 T PO  BID PRN FOR PAIN 08/05/18   [provider]   Social History   Socioeconomic History  . Marital status: Married    Spouse name: Not on file  .  Number of children: Not on file  . Years of education: Not on file  . Highest education level: Not on file  Occupational History  . Not on file  Social Needs  . Financial resource strain: Not on file  . Food insecurity    Worry: Not on file    Inability: Not on file  . Transportation needs    Medical: Not on file    Non-medical: Not on file  Tobacco Use  . Smoking status: Former Research scientist (life sciences)  . Smokeless tobacco: Never Used  Substance and Sexual Activity  . Alcohol use: No  . Drug use: No  . Sexual activity: Not on file  Lifestyle  . Physical activity    Days per week: Not on file    Minutes per  session: Not on file  . Stress: Not on file  Relationships  . Social Herbalist on phone: Not on file    Gets together: Not on file    Attends religious service: Not on file    Active member of club or organization: Not on file    Attends meetings of clubs or organizations: Not on file    Relationship status: Not on file  . Intimate partner violence    Fear of current or ex partner: Not on file    Emotionally abused: Not on file    Physically abused: Not on file    Forced sexual activity: Not on file  Other Topics Concern  . Not on file  Social History Narrative   Originally from [place]    Review of Systems  Constitutional: Negative for fatigue and unexpected weight change.  Eyes: Negative for visual disturbance.  Respiratory: Positive for shortness of breath (rare, not currently. ). Negative for cough and chest tightness.   Cardiovascular: Negative for chest pain, palpitations and leg swelling.  Gastrointestinal: Negative for abdominal pain and blood in stool.  Neurological: Negative for dizziness, light-headedness and headaches.       Objective:   Physical Exam Vitals signs reviewed.  Constitutional:      Appearance: He is well-developed.  HENT:     Head: Normocephalic and atraumatic.  Eyes:     Pupils: Pupils are equal, round, and reactive to light.  Neck:     Vascular: No carotid bruit or JVD.  Cardiovascular:     Rate and Rhythm: Normal rate and regular rhythm.     Heart sounds: Normal heart sounds. No murmur.  Pulmonary:     Effort: Pulmonary effort is normal.     Breath sounds: Normal breath sounds. No rales.  Musculoskeletal:        General: No tenderness.     Comments: Bony prominence left knee  Skin:    General: Skin is warm and dry.  Neurological:     Mental Status: He is alert and oriented to person, place, and time.    Vitals:   10/13/18 1131 10/13/18 1216  BP: (!) 183/80 (!) 160/70  Pulse: (!) 50   Temp: 97.6 F (36.4 C)    TempSrc: Oral   SpO2: 95%   Weight: 135 lb 6.4 oz (61.4 kg)   Height: 5\' 2"  (1.575 m)        Assessment & Plan:   Reginald Long is a 78 y.o. male Essential hypertension - Plan: lisinopril (ZESTRIL) 20 MG tablet  -Reports normal home readings, elevated in office off meds.  Continue lisinopril, same dose.  Labs pending.  Recheck 2 weeks with home blood  pressure readings.  History of gout - Plan: Uric Acid, colchicine 0.6 MG tablet  -Check uric acid, colchicine refilled for as needed use, depending on uric acid level may need allopurinol.  Hyperlipidemia, unspecified hyperlipidemia type - Plan: Comprehensive metabolic panel, Lipid panel  -Check lipids, unsure why he is no longer on statin.  Likely will restart  Meds ordered this encounter  Medications  . lisinopril (ZESTRIL) 20 MG tablet    Sig: TAKE 1 TABLET(20 MG) BY MOUTH DAILY    Dispense:  90 tablet    Refill:  1  . colchicine 0.6 MG tablet    Sig: Take two tablets at onset of flare up then 0.6mg  one hour later.    Dispense:  15 tablet    Refill:  2   Patient Instructions     I will check some labs. May need to be on daily gout medicine and cholesterol medicine - follow up in 2 weeks to discuss labs, meds and recheck blood pressure. Bring ALL meds to that visit as well.   Tylenol if needed for knee pain.   Continue lisinopril each day and recheck in 2 weeks. Keep a record of your blood pressures outside of the office and bring them to the next office visit.   Return to the clinic or go to the nearest emergency room if any of your symptoms worsen or new symptoms occur.    If you have lab work done today you will be contacted with your lab results within the next 2 weeks.  If you have not heard from [place] then please contact us. The fastest way to get your results is to register for My Chart.   IF you received an x-ray today, you will receive an invoice from [facility]. Please contact [facility] at  [phone] with questions or concerns regarding your invoice.   IF you received labwork today, you will receive an invoice from [facility]. Please contact [facility] at [phone] with questions or concerns regarding your invoice.   Our billing staff will not be able to assist you with questions regarding bills from these companies.  You will be contacted with the lab results as soon as they are available. The fastest way to get your results is to activate your My Chart account. Instructions are located on the last page of this paperwork. If you have not heard from [place] regarding the results in 2 weeks, please contact this office.       Signed,   Merri Ray, MD [facility] [place].  10/16/18 9:13 AM

## 2018-10-13 NOTE — Patient Instructions (Addendum)
   I will check some labs. May need to be on daily gout medicine and cholesterol medicine - follow up in 2 weeks to discuss labs, meds and recheck blood pressure. Bring ALL meds to that visit as well.   Tylenol if needed for knee pain.   Continue lisinopril each day and recheck in 2 weeks. Keep a record of your blood pressures outside of the office and bring them to the next office visit.   Return to the clinic or go to the nearest emergency room if any of your symptoms worsen or new symptoms occur.    If you have lab work done today you will be contacted with your lab results within the next 2 weeks.  If you have not heard from [place] then please contact us. The fastest way to get your results is to register for My Chart.   IF you received an x-ray today, you will receive an invoice from [facility]. Please contact [facility] at [phone] with questions or concerns regarding your invoice.   IF you received labwork today, you will receive an invoice from [facility]. Please contact [facility] at [phone] with questions or concerns regarding your invoice.   Our billing staff will not be able to assist you with questions regarding bills from these companies.  You will be contacted with the lab results as soon as they are available. The fastest way to get your results is to activate your My Chart account. Instructions are located on the last page of this paperwork. If you have not heard from [place] regarding the results in 2 weeks, please contact this office.

## 2018-10-15 LAB — LIPID PANEL
Chol/HDL Ratio: 5.2 ratio — ABNORMAL HIGH (ref 0.0–5.0)
Cholesterol, Total: 323 mg/dL — ABNORMAL HIGH (ref 100–199)
HDL: 62 mg/dL (ref >39)
LDL Chol Calc (NIH): 204 mg/dL — ABNORMAL HIGH (ref 0–99)
Triglycerides: 285 mg/dL — ABNORMAL HIGH (ref 0–149)
VLDL Cholesterol Cal: 57 mg/dL — ABNORMAL HIGH (ref 5–40)

## 2018-10-15 LAB — COMPREHENSIVE METABOLIC PANEL
ALT: 47 IU/L — ABNORMAL HIGH (ref 0–44)
AST: 37 IU/L (ref 0–40)
Albumin/Globulin Ratio: 1.6 (ref 1.2–2.2)
Albumin: 4.5 g/dL (ref 3.7–4.7)
Alkaline Phosphatase: 82 IU/L (ref 39–117)
BUN/Creatinine Ratio: 15 (ref 10–24)
BUN: 17 mg/dL (ref 8–27)
Bilirubin Total: 0.3 mg/dL (ref 0.0–1.2)
CO2: 19 mmol/L — ABNORMAL LOW (ref 20–29)
Calcium: 9.6 mg/dL (ref 8.6–10.2)
Chloride: 105 mmol/L (ref 96–106)
Creatinine, Ser: 1.11 mg/dL (ref 0.76–1.27)
GFR calc Af Amer: 74 mL/min/{1.73_m2} (ref >59)
GFR calc non Af Amer: 64 mL/min/{1.73_m2} (ref >59)
Globulin, Total: 2.9 g/dL (ref 1.5–4.5)
Glucose: 88 mg/dL (ref 65–99)
Potassium: 4.6 mmol/L (ref 3.5–5.2)
Sodium: 140 mmol/L (ref 134–144)
Total Protein: 7.4 g/dL (ref 6.0–8.5)

## 2018-10-15 LAB — URIC ACID: Uric Acid: 11.3 mg/dL — ABNORMAL HIGH (ref 3.7–8.6)

## 2018-10-16 ENCOUNTER — Encounter: Payer: Self-pay | Admitting: Family Medicine

## 2018-10-25 ENCOUNTER — Ambulatory Visit: Payer: Medicare HMO | Admitting: Family Medicine

## 2018-10-26 ENCOUNTER — Encounter: Payer: Self-pay | Admitting: Family Medicine

## 2018-10-29 DIAGNOSIS — R69 Illness, unspecified: Secondary | ICD-10-CM | POA: Diagnosis not present

## 2018-11-04 ENCOUNTER — Ambulatory Visit (INDEPENDENT_AMBULATORY_CARE_PROVIDER_SITE_OTHER): Payer: Medicare HMO | Admitting: Family Medicine

## 2018-11-04 ENCOUNTER — Other Ambulatory Visit: Payer: Self-pay

## 2018-11-04 ENCOUNTER — Encounter: Payer: Self-pay | Admitting: Family Medicine

## 2018-11-04 VITALS — BP 176/76 | HR 72 | Temp 98.2°F | Wt 135.4 lb

## 2018-11-04 DIAGNOSIS — E785 Hyperlipidemia, unspecified: Secondary | ICD-10-CM

## 2018-11-04 DIAGNOSIS — M109 Gout, unspecified: Secondary | ICD-10-CM

## 2018-11-04 DIAGNOSIS — I10 Essential (primary) hypertension: Secondary | ICD-10-CM | POA: Diagnosis not present

## 2018-11-04 DIAGNOSIS — M1712 Unilateral primary osteoarthritis, left knee: Secondary | ICD-10-CM | POA: Diagnosis not present

## 2018-11-04 MED ORDER — AMLODIPINE BESYLATE 2.5 MG PO TABS: 2.5000 mg | ORAL_TABLET | Freq: Every day | ORAL | 1 refills | Status: DC

## 2018-11-04 MED ORDER — ATORVASTATIN CALCIUM 10 MG PO TABS: 10.0000 mg | ORAL_TABLET | Freq: Every day | ORAL | 0 refills | Status: DC

## 2018-11-04 MED ORDER — ALLOPURINOL 100 MG PO TABS: 100.0000 mg | ORAL_TABLET | Freq: Every day | ORAL | 5 refills | Status: DC

## 2018-11-04 MED ORDER — PREDNISONE 20 MG PO TABS: 40.0000 mg | ORAL_TABLET | Freq: Every day | ORAL | 0 refills | Status: DC

## 2018-11-04 NOTE — Progress Notes (Signed)
Subjective:    Patient ID: Reginald Long, male    DOB: January 03, 1941, 78 y.o.   MRN: [id number]  HPI Reginald Long is a 78 y.o. male Presents today for: Chief Complaint  Patient presents with   Hypertension    here for f/u on blood pressure and gout in right heel and left knee for 2 weeks   Here with Grandson. Declines video interpreter - grandson to interpret.  Later needed video interpreter. ID [id number]  Hypertension: BP Readings from Last 3 Encounters:  11/04/18 (!) 176/76  10/13/18 (!) 160/70  08/30/18 132/70   Lab Results  Component Value Date   CREATININE 1.11 10/13/2018  Off meds at last visit but had taken days prior.  Continued on lisinopril 20 mg plan for home reading evaluation today. No home readings.    Hyperlipidemia:  Lab Results  Component Value Date   CHOL 323 (H) 10/13/2018   HDL 62 10/13/2018   LDLCALC 204 (H) 10/13/2018   TRIG 285 (H) 10/13/2018   CHOLHDL 5.2 (H) 10/13/2018   Lab Results  Component Value Date   ALT 47 (H) 10/13/2018   AST 37 10/13/2018   ALKPHOS 82 10/13/2018   BILITOT 0.3 10/13/2018  See last office visit.  Unsure why he has been taken off statin.  Significant elevated LDL as above.   Gout:  Discussed last visit September 9.  Colchicine refilled for as needed use, plan for discussion of allopurinol today. R heel pain past 2 weeks. no swelling. Feels like previous gout.  Pain in L knee past 2 weeks as well.  Took colchicine 2 times per day - has been taking colchicine BID since last visit.   Lab Results  Component Value Date   LABURIC 11.3 (H) 10/13/2018      Patient Active Problem List   Diagnosis Date Noted   Acute appendicitis 12/08/2016   Non-English speaking patient (Vietnamese) 12/08/2016   Right renal mass 12/08/2016   CKD (chronic kidney disease), stage III 12/08/2016   PAF (paroxysmal atrial fibrillation) ([place]) 12/08/2016   Acute gangrenous appendicitis with perforation and peritonitis 12/08/2016   Viral  enteritis 12/04/2015   Hyperlipidemia 12/04/2015   Essential hypertension 12/04/2015   Right foot pain 12/04/2015   Acute gout of right ankle 12/04/2015   Melanoma of skin, site unspecified 10/26/2013   Past Medical History:  Diagnosis Date   Gout    Hyperlipidemia    Hypertension    PAF (paroxysmal atrial fibrillation) ([place]) 12/08/2016   Past Surgical History:  Procedure Laterality Date   LAPAROSCOPIC APPENDECTOMY N/A 12/08/2016   Procedure: APPENDECTOMY LAPAROSCOPIC;  Surgeon: [name], MD;  Location: [facility];  Service: General;  Laterality: N/A;   right thumb surgery     No Known Allergies Prior to Admission medications   Medication Sig Start Date End Date Taking? Authorizing Provider  colchicine 0.6 MG tablet Take two tablets at onset of flare up then 0.6mg  one hour later. 10/13/18  Yes Wendie Agreste, MD  lisinopril (ZESTRIL) 20 MG tablet TAKE 1 TABLET(20 MG) BY MOUTH DAILY 10/13/18  Yes Wendie Agreste, MD   Social History   Socioeconomic History   Marital status: Married    Spouse name: Not on file   Number of children: Not on file   Years of education: Not on file   Highest education level: Not on file  Occupational History   Not on file  Social Needs   Financial resource strain: Not on file   Food  insecurity    Worry: Not on file    Inability: Not on file   Transportation needs    Medical: Not on file    Non-medical: Not on file  Tobacco Use   Smoking status: Former Smoker   Smokeless tobacco: Never Used  Substance and Sexual Activity   Alcohol use: No   Drug use: No   Sexual activity: Not on file  Lifestyle   Physical activity    Days per week: Not on file    Minutes per session: Not on file   Stress: Not on file  Relationships   Social connections    Talks on phone: Not on file    Gets together: Not on file    Attends religious service: Not on file    Active member of club or organization: Not on file    Attends  meetings of clubs or organizations: Not on file    Relationship status: Not on file   Intimate partner violence    Fear of current or ex partner: Not on file    Emotionally abused: Not on file    Physically abused: Not on file    Forced sexual activity: Not on file  Other Topics Concern   Not on file  Social History Narrative   Originally from [place]    Review of Systems Per HPI.     Objective:   Physical Exam Vitals signs reviewed.  Constitutional:      Appearance: He is well-developed.  HENT:     Head: Normocephalic and atraumatic.  Eyes:     Pupils: Pupils are equal, round, and reactive to light.  Neck:     Vascular: No carotid bruit or JVD.  Cardiovascular:     Rate and Rhythm: Normal rate and regular rhythm.     Heart sounds: Normal heart sounds. No murmur.  Pulmonary:     Effort: Pulmonary effort is normal.     Breath sounds: Normal breath sounds. No rales.  Abdominal:     General: Abdomen is flat.     Tenderness: There is no abdominal tenderness.  Musculoskeletal:     Left knee: He exhibits swelling (bony prominence wiht swelling, warmth medial L knee. overall intact ROM. ). He exhibits normal range of motion (min dec rom. ) and no erythema. Tenderness found.     Right foot: Normal range of motion. Tenderness present.       Feet:  Skin:    General: Skin is warm and dry.  Neurological:     Mental Status: He is alert and oriented to person, place, and time.    Vitals:   11/04/18 1359 11/04/18 1407  BP: (!) 181/76 (!) 176/76  Pulse: 72   Temp: 98.2 F (36.8 C)   TempSrc: Oral   SpO2: 99%   Weight: 135 lb 6.4 oz (61.4 kg)       Assessment & Plan:    Reginald Long is a 78 y.o. male Osteoarthritis of left knee, unspecified osteoarthritis type - Plan: predniSONE (DELTASONE) 20 MG tablet Gout of multiple sites, unspecified cause, unspecified chronicity - Plan: predniSONE (DELTASONE) 20 MG tablet, allopurinol (ZYLOPRIM) 100 MG tablet  -Degenerative changes  seen on imaging in 2018, but suspected gout flare of right heel, left knee.  No relief with use of colchicine.  -Prednisone 40 mg daily x5 days, then when flare has resolved for at least a few weeks can start allopurinol given significant elevated uric acid.  Potential flare worsening discussed if started  too soon.  Recheck 6 weeks.  Side effects and risks of medications were discussed with understanding expressed.  Hyperlipidemia, unspecified hyperlipidemia type - Plan: atorvastatin (LIPITOR) 10 MG tablet  -Significant elevated LDL.  Options discussed, including with current age of 40, but he would like to start medication.    -Start initially with Lipitor 10 mg daily, recheck levels in 6 weeks, consider higher dosing.  If difficulty tolerating medication, could try intermittent dosing  Essential hypertension - Plan: amLODipine (NORVASC) 2.5 MG tablet  -Continue lisinopril, add amlodipine with monitoring at home.  Recheck 6 weeks  Meds ordered this encounter  Medications   predniSONE (DELTASONE) 20 MG tablet    Sig: Take 2 tablets (40 mg total) by mouth daily with breakfast.    Dispense:  10 tablet    Refill:  0   allopurinol (ZYLOPRIM) 100 MG tablet    Sig: Take 1 tablet (100 mg total) by mouth daily.    Dispense:  30 tablet    Refill:  5   amLODipine (NORVASC) 2.5 MG tablet    Sig: Take 1 tablet (2.5 mg total) by mouth daily.    Dispense:  90 tablet    Refill:  1   atorvastatin (LIPITOR) 10 MG tablet    Sig: Take 1 tablet (10 mg total) by mouth daily at 6 PM.    Dispense:  90 tablet    Refill:  0   Patient Instructions     Prednisone 2 pills per day for gout flare. In 3-4 weeks start allopurinol (as long as NOT having gout flare) once per day to help PREVENT gout flares. We will check labs next visit after starting allopurinol to decide on dose changes if needed.  If you do have another gout flare, can take colchicine if needed.   Cholesterol very high. Start lipitor 10mg   once per day and recheck labs at next visit (6 weeks).   Blood pressure too high. Continue lisinopril, start amlodipine once per day. Some of elevation may be due to gout pain as well.  Recheck in 6 weeks.   High Cholesterol  High cholesterol is a condition in which the blood has high levels of a white, waxy, fat-like substance (cholesterol). The human body needs small amounts of cholesterol. The liver makes all the cholesterol that the body needs. Extra (excess) cholesterol comes from the food that we eat. Cholesterol is carried from the liver by the blood through the blood vessels. If you have high cholesterol, deposits (plaques) may build up on the walls of your blood vessels (arteries). Plaques make the arteries narrower and stiffer. Cholesterol plaques increase your risk for heart attack and stroke. Work with your health care provider to keep your cholesterol levels in a healthy range. What increases the risk? This condition is more likely to develop in people who:  Eat foods that are high in animal fat (saturated fat) or cholesterol.  Are overweight.  Are not getting enough exercise.  Have a family history of high cholesterol. What are the signs or symptoms? There are no symptoms of this condition. How is this diagnosed? This condition may be diagnosed from the results of a blood test.  If you are older than age 54, your health care provider may check your cholesterol every 4-6 years.  You may be checked more often if you already have high cholesterol or other risk factors for heart disease. The blood test for cholesterol measures:  "Bad" cholesterol (LDL cholesterol). This is the main  type of cholesterol that causes heart disease. The desired level for LDL is less than 100.  "Good" cholesterol (HDL cholesterol). This type helps to protect against heart disease by cleaning the arteries and carrying the LDL away. The desired level for HDL is 60 or higher.  Triglycerides. These  are fats that the body can store or burn for energy. The desired number for triglycerides is lower than 150.  Total cholesterol. This is a measure of the total amount of cholesterol in your blood, including LDL cholesterol, HDL cholesterol, and triglycerides. A healthy number is less than 200. How is this treated? This condition is treated with diet changes, lifestyle changes, and medicines. Diet changes  This may include eating more whole grains, fruits, vegetables, nuts, and fish.  This may also include cutting back on red meat and foods that have a lot of added sugar. Lifestyle changes  Changes may include getting at least 40 minutes of aerobic exercise 3 times a week. Aerobic exercises include walking, biking, and swimming. Aerobic exercise along with a healthy diet can help you maintain a healthy weight.  Changes may also include quitting smoking. Medicines  Medicines are usually given if diet and lifestyle changes have failed to reduce your cholesterol to healthy levels.  Your health care provider may prescribe a statin medicine. Statin medicines have been shown to reduce cholesterol, which can reduce the risk of heart disease. Follow these instructions at home: Eating and drinking If told by your health care provider:  Eat chicken (without skin), fish, veal, shellfish, ground [place] breast, and round or loin cuts of red meat.  Do not eat fried foods or fatty meats, such as hot dogs and salami.  Eat plenty of fruits, such as apples.  Eat plenty of vegetables, such as broccoli, potatoes, and carrots.  Eat beans, peas, and lentils.  Eat grains such as barley, rice, couscous, and bulgur wheat.  Eat pasta without cream sauces.  Use skim or nonfat milk, and eat low-fat or nonfat yogurt and cheeses.  Do not eat or drink whole milk, cream, ice cream, egg yolks, or hard cheeses.  Do not eat stick margarine or tub margarines that contain trans fats (also called partially  hydrogenated oils).  Do not eat saturated tropical oils, such as coconut oil and palm oil.  Do not eat cakes, cookies, crackers, or other baked goods that contain trans fats.  General instructions  Exercise as directed by your health care provider. Increase your activity level with activities such as gardening, walking, and taking the stairs.  Take over-the-counter and prescription medicines only as told by your health care provider.  Do not use any products that contain nicotine or tobacco, such as cigarettes and e-cigarettes. If you need help quitting, ask your health care provider.  Keep all follow-up visits as told by your health care provider. This is important. Contact a health care provider if:  You are struggling to maintain a healthy diet or weight.  You need help to start on an exercise program.  You need help to stop smoking. Get help right away if:  You have chest pain.  You have trouble breathing. This information is not intended to replace advice given to you by your health care provider. Make sure you discuss any questions you have with your health care provider. Document Released: 01/20/2005 Document Revised: 01/23/2017 Document Reviewed: 07/21/2015 Elsevier Patient Education  [employer] [place].    B?nh gu?t Gout  B?nh gt l m?t tnh tr?ng gy  s?ng ?au ? cc kh?p. Gt l m?t lo?i b?nh vim ? cc kh?p (vim kh?p). Tnh tr?ng na?y l do co? qua? nhi?u axit uric trong c? th? gy ra. Axit uric la? m?t ch?t ha h?c hi?nh tha?nh khi c? th? phn h?y ca?c ch?t c tn la? purine. Purine c vai tr quan tro?ng trong vi?c ta?o ra ca?c protein trong c? th?. Khi c? th? co? qua? nhi?u axit uric, nh??ng tinh th? s??c nho?n co? th? hi?nh tha?nh v tch t? bn trong cc kh??p. Tnh tr?ng ny gy ?au v s?ng. Ca?c c?n gu?t co? th? xa?y ra nhanh cho?ng va? c th? r?t ?au (gu?t c?p ti?nh). Theo th?i gian, ca?c c?n ? co? th? a?nh h??ng ??n nhi?u kh??p h?n va? tr??  nn th???ng xuyn h?n (gu?t ma?n ti?nh). Gu?t cu?ng co? th? la?m cho axit uric ti?ch tu? d???i da va? bn trong th?n. Nguyn nhn g gy ra? B?nh na?y l do co? qua? nhi?u axit uric trong ma?u gy ra. ?i?u ny c th? x?y ra do:  Th?n cu?a quy? vi? khng ?o tha?i ?u? axit uric ra kho?i ma?u cu?a quy? vi?. ?y l nguyn nhn ph? bi?n nh?t.  C? th? quy? vi? ta?o ra qua? nhi?u a xi?t uric. Ti?nh tra?ng na?y co? th? xa?y ra khi bi? m?t s? loa?i ung th? ho??c ?i?u tri? ung th?. No? cu?ng co? th? xa?y ra n?u c? th? quy? vi? phn h?y qua? nhi?u h?ng c?u (thi?u ma?u tan huy?t).  Quy? vi? ?n qua? nhi?u th??c ph?m co? nhi?u purine. Nh??ng th??c ph?m na?y bao g?m n?i ta?ng va? m?t s? loa?i ha?i sa?n. R???u, ???c bi?t la? bia, cu?ng co? nhi?u purine. M?t c?n gu?t co? th? kh??i pha?t do ch?n th??ng ho??c c?ng th??ng. ?i?u g lm t?ng nguy c?? Tnh tr?ng ny d? c kh? n?ng x?y ra h?n n?u qu v?:  C ti?n s? gia ?nh b? b?nh gu?t.  La? nam gi??i va? ? ?? tu?i trung nin.  La? n?? gi??i va? ?a? ma?n kinh.  Bo ph.  Th???ng xuyn u?ng r???u, ???c bi?t la? bia.  Bi? m?t n???c.  Su?t cn qua? nhanh.  Co? m?t t?ng c?y ghe?p.  Bi? ng? ??c chi?.  Du?ng m?t s? loa?i thu?c nh?t ??nh, bao g?m aspirin, cyclosporine, l??i ti?u, levodopa va? niacin.  B? b?nh th?n.  B? m?t tnh tr?ng v? da c tn l b?nh v?y n?n. Cc d?u hi?u ho?c tri?u ch?ng l g? M?t c?n gu?t c?p xa?y ra nhanh cho?ng. No? th???ng xa?y ra chi? ?? m?t kh??p x??ng. Ch? ph? bi?n nh?t la? ngo?n chn ca?i. Ca?c c?n gu?t th???ng b??t ??u va?o ban ?m. Nh??ng kh??p kha?c co? th? bi? a?nh h???ng bao g?m cc kh??p ? ba?n chn, c? chn, ??u g?i, cc ngo?n tay, c? tay ho??c khuy?u tay. Nh?ng tri?u ch?ng c?a tnh tr?ng ny c th? bao g?m:  ?au r?t nhi?u.  ?m.  S?ng n?.  C?ng kh?p.  Nh?y c?m ?au. Kh??p bi? a?nh h???ng co? th? r?t ?au khi ch?m va?o.  Da bo?ng, ?o? ho??c ??  ti?a.  ?n l?nh v s?t. B?nh gu?t ma?n ti?nh co? th? gy tri?u ch??ng th???ng xuyn h?n. Nhi?u kh??p co? th? bi? a?nh h???ng h?n. Quy? vi? cu?ng co? th? co? c?c mu tr??ng ho??c va?ng (h?t tophi) ?? ba?n tay ho??c ba?n chn ho??c ?? nh??ng khu v??c kha?c g?n cc kh??p. Ch?n ?on tnh tr?ng ny nh? th? no? Tnh tr?ng ny c th? ???c ch?n ?on d?a vo  tri?u ch??ng, khai thc b?nh s? v khm th?c th?Sander Nephew v? c th? ph?i lm cc ki?m tra, ch?ng h?n nh?:  Xe?t nghi?m ma?u ?? ?o n?ng ?? axit uric.  L?y di?ch kh??p b??ng m?t m?i kim m?nh (hu?t di?ch) ?? ti?m ca?c tinh th? axit uric.  Chu?p x quang ?? ti?m th??ng t?n ? kh??p. Tnh tr?ng ny ???c ?i?u tr? nh? th? no? ?i?u tri? tnh tr?ng na?y co? hai giai ?oa?n: ?i?u tri? c?n c?p ti?nh va? ng?n ng?a nh??ng c?n kha?c trong t??ng lai. ?i?u tri? c?n gu?t c?p ti?nh co? th? bao g?m du?ng thu?c gia?m ?au va? gi?m s?ng, bao g?m:  Thu?c ch?ng vim khng steroid (NSAID).  Thu?c steroids. ?y la? cc thu?c ch?ng vim ma?nh ma? co? th? du?ng qua ????ng mi?ng (????ng u?ng) ho??c tim va?o kh??p.  Thu?c Colchicine. Thu?c na?y la?m gia?m ?au va? gi?m s?ng khi ????c du?ng ngay sau m?t c?n gu?t. Thu?c na?y cu?ng co? th? ????c du?ng qua ????ng u?ng ho??c qua m?t ???ng truy?n t?nh m?ch (IV). ?i?u tr? pho?ng ng??a c th? bao g?m:  Ha?ng nga?y s?? du?ng ca?c li?u thu?c ch?ng vim khng steroid (NSAID) ho??c colchicine nh? h?n.  S?? du?ng thu?c la?m gia?m n?ng ?? axit uric trong ma?u cu?a quy? vi?.  Thay ??i ch? ?? ?n. Qu v? c th? c?n g?p chuyn gia dinh d??ng ?? bi?t nn ?n v u?ng nh?ng g ?? ng?n ng?a gt. Tun th? nh?ng h??ng d?n ny ? nh: Trong m?t c?n gu?t   N?u ???c ch? d?n, hy ch??m ? l?nh ln vng b? ?nh h??ng: ? Cho ? l?nh vo ti ni lng. ? ?? kh?n t?m ? gi?a da v ti ch??m. ? Ch??m ? l?nh trong 20 pht, 2-3 l?n m?i ngy.  Nng (nng cao) kh??p bi? a?nh h???ng ln trn m??c cu?a tim ca?ng th???ng  xuyn ca?ng t?t.  ?? kh?p ngh? ng?i cng nhi?u cng t?t. N?u kh??p bi? a?nh h???ng ?? chn, quy? vi? co? th? ????c cho du?ng na?ng.  Tun th? ch? d?n c?a chuyn gia ch?m [place] s?c kh?e v? cc h?n ch? ?n ho?c u?ng. Tra?nh cc c?n gu?t trong t??ng lai  Tun thu? ch? ?? ?n i?t purine theo chi? d?n cu?a chuyn gia dinh d???ng ho??c chuyn gia ch?m so?c s??c kho?e. Tra?nh nh??ng th??c ph?m va? ?? u?ng co? nhi?u purine, bao g?m gan, th?n, ca? tr?ng, m?ng ty, ca? tri?ch, n?m, con trai va? bia.  Duy tri? cn n??ng co? l??i cho s??c kho?e ho??c gia?m cn n?u quy? vi? ?ang th??a cn. N?u quy? vi? mu?n gia?m cn, ha?y no?i chuy?n v??i chuyn gia ch?m so?c s??c kho?e. ?i?u quan tro?ng la? quy? vi? khng ????c gia?m cn qua? nhanh.  B??t ??u ho??c duy tri? m?t ch??ng tri?nh t?p th? du?c theo chi? d?n cu?a chuyn gia ch?m so?c s??c kho?e. ?n v u?ng  U?ng ?? n??c ?? gi? cho n??c ti?u c mu vng nh?t.  N?u qu v? u?ng r??u: ? Gi?i h?n l??ng r??u qu v? u?ng ? m?c:  0-1 ly/ngy ??i v?i ph? n?.  0-2 ly/ngy ??i v?i nam gi?i. ? Bi?t r m?t ly c bao nhiu r??u. ? M?, m?t ly t??ng ???ng v?i m?t chai bia 12 ao x? (355 mL), m?t ly r??u vang 5 ao x? (148 mL), ho?c m?t ly r??u m?nh 1 ao x? (44 mL). H??ng d?n chung  Ch? s? d?ng thu?c khng k ??n v thu?c k ??n theo ch? d?n c?a chuyn gia ch?m [place]  s?c kh?e.  Khng li xe ho?c s? d?ng my mc h?ng n?ng trong khi dng thu?c gi?m ?au k ??n.  Tr? l?i sinh ho?t bnh th??ng theo ch? d?n c?a chuyn gia ch?m [place] s?c kh?e. Hy h?i chuyn gia ch?m [place] s?c kh?e v? cc ho?t ??ng no l an ton cho qu v?.  Tun th? t?t c? cc l?n khm theo di theo ch? d?n c?a chuyn gia ch?m [place] s?c kh?e. ?i?u ny c vai tr quan tr?ng. Hy lin l?c v?i chuyn gia ch?m [place] s?c kh?e n?u qu v? b?:  M?t c?n gt khc.  Ti?p t?c c cc tri?u ch?ng cu?a c?n gu?t sau 10 nga?y ?i?u tr?.  Tc d?ng ph? c?a thu?c.  ?n l?nh ho?c s?t.  ?au ra?t khi ?i  ti?u.  ?au ?? th??t l?ng ho??c ? bu?ng. Yu c?u tr? gip ngay l?p t?c n?u qu v?:  B? ?au r?t nhi?u ho??c ?au khng ki?m soa?t ????c.  Khng th? ?i ti?u ???c. Tm t?t  B?nh gt gy s?ng ?au ? cc kh?p do vim gy ra.  V? tr ?au ph? bi?n nh?t l ngn chn ci, nh?ng ?au c th? ?nh h??ng ??n cc kh?p khc trong c? th?.  Thu?c v thay ??i ch? ?? ?n u?ng c th? gip ng?n ng?a v ?i?u tr? cc c?n gt. Thng tin ny khng nh?m m?c ?ch thay th? cho l?i khuyn m chuyn gia ch?m [place] s?c kh?e ni v?i qu v?. Hy b?o ??m qu v? ph?i th?o lu?n b?t k? v?n ?? g m qu v? c v?i chuyn gia ch?m [place] s?c kh?e c?a qu v?. Document Released: 10/30/2004 Document Revised: 09/21/2017 Document Reviewed: 09/21/2017 Elsevier Patient Education  [employer].    If you have lab work done today you will be contacted with your lab results within the next 2 weeks.  If you have not heard from [place] then please contact us. The fastest way to get your results is to register for My Chart.   IF you received an x-ray today, you will receive an invoice from [facility]. Please contact [facility] at [phone] with questions or concerns regarding your invoice.   IF you received labwork today, you will receive an invoice from [place]. Please contact [facility] at [phone] with questions or concerns regarding your invoice.   Our billing staff will not be able to assist you with questions regarding bills from these companies.  You will be contacted with the lab results as soon as they are available. The fastest way to get your results is to activate your My Chart account. Instructions are located on the last page of this paperwork. If you have not heard from [place] regarding the results in 2 weeks, please contact this office.       Signed,   Merri Ray, MD [facility] [place].  11/04/18 6:48 PM

## 2018-11-04 NOTE — Patient Instructions (Addendum)
Prednisone 2 pills per day for gout flare. In 3-4 weeks start allopurinol (as long as NOT having gout flare) once per day to help PREVENT gout flares. We will check labs next visit after starting allopurinol to decide on dose changes if needed.  If you do have another gout flare, can take colchicine if needed.   Cholesterol very high. Start lipitor 10mg  once per day and recheck labs at next visit (6 weeks).   Blood pressure too high. Continue lisinopril, start amlodipine once per day. Some of elevation may be due to gout pain as well.  Recheck in 6 weeks.   High Cholesterol  High cholesterol is a condition in which the blood has high levels of a white, waxy, fat-like substance (cholesterol). The human body needs small amounts of cholesterol. The liver makes all the cholesterol that the body needs. Extra (excess) cholesterol comes from the food that we eat. Cholesterol is carried from the liver by the blood through the blood vessels. If you have high cholesterol, deposits (plaques) may build up on the walls of your blood vessels (arteries). Plaques make the arteries narrower and stiffer. Cholesterol plaques increase your risk for heart attack and stroke. Work with your health care provider to keep your cholesterol levels in a healthy range. What increases the risk? This condition is more likely to develop in people who:  Eat foods that are high in animal fat (saturated fat) or cholesterol.  Are overweight.  Are not getting enough exercise.  Have a family history of high cholesterol. What are the signs or symptoms? There are no symptoms of this condition. How is this diagnosed? This condition may be diagnosed from the results of a blood test.  If you are older than age 57, your health care provider may check your cholesterol every 4-6 years.  You may be checked more often if you already have high cholesterol or other risk factors for heart disease. The blood test for cholesterol  measures:  "Bad" cholesterol (LDL cholesterol). This is the main type of cholesterol that causes heart disease. The desired level for LDL is less than 100.  "Good" cholesterol (HDL cholesterol). This type helps to protect against heart disease by cleaning the arteries and carrying the LDL away. The desired level for HDL is 60 or higher.  Triglycerides. These are fats that the body can store or burn for energy. The desired number for triglycerides is lower than 150.  Total cholesterol. This is a measure of the total amount of cholesterol in your blood, including LDL cholesterol, HDL cholesterol, and triglycerides. A healthy number is less than 200. How is this treated? This condition is treated with diet changes, lifestyle changes, and medicines. Diet changes  This may include eating more whole grains, fruits, vegetables, nuts, and fish.  This may also include cutting back on red meat and foods that have a lot of added sugar. Lifestyle changes  Changes may include getting at least 40 minutes of aerobic exercise 3 times a week. Aerobic exercises include walking, biking, and swimming. Aerobic exercise along with a healthy diet can help you maintain a healthy weight.  Changes may also include quitting smoking. Medicines  Medicines are usually given if diet and lifestyle changes have failed to reduce your cholesterol to healthy levels.  Your health care provider may prescribe a statin medicine. Statin medicines have been shown to reduce cholesterol, which can reduce the risk of heart disease. Follow these instructions at home: Eating and drinking If told  by your health care provider:  Eat chicken (without skin), fish, veal, shellfish, ground [place] breast, and round or loin cuts of red meat.  Do not eat fried foods or fatty meats, such as hot dogs and salami.  Eat plenty of fruits, such as apples.  Eat plenty of vegetables, such as broccoli, potatoes, and carrots.  Eat beans, peas,  and lentils.  Eat grains such as barley, rice, couscous, and bulgur wheat.  Eat pasta without cream sauces.  Use skim or nonfat milk, and eat low-fat or nonfat yogurt and cheeses.  Do not eat or drink whole milk, cream, ice cream, egg yolks, or hard cheeses.  Do not eat stick margarine or tub margarines that contain trans fats (also called partially hydrogenated oils).  Do not eat saturated tropical oils, such as coconut oil and palm oil.  Do not eat cakes, cookies, crackers, or other baked goods that contain trans fats.  General instructions  Exercise as directed by your health care provider. Increase your activity level with activities such as gardening, walking, and taking the stairs.  Take over-the-counter and prescription medicines only as told by your health care provider.  Do not use any products that contain nicotine or tobacco, such as cigarettes and e-cigarettes. If you need help quitting, ask your health care provider.  Keep all follow-up visits as told by your health care provider. This is important. Contact a health care provider if:  You are struggling to maintain a healthy diet or weight.  You need help to start on an exercise program.  You need help to stop smoking. Get help right away if:  You have chest pain.  You have trouble breathing. This information is not intended to replace advice given to you by your health care provider. Make sure you discuss any questions you have with your health care provider. Document Released: 01/20/2005 Document Revised: 01/23/2017 Document Reviewed: 07/21/2015 Elsevier Patient Education  [place].    B?nh gu?t Gout  B?nh gt l m?t tnh tr?ng gy s?ng ?au ? cc kh?p. Gt l m?t lo?i b?nh vim ? cc kh?p (vim kh?p). Tnh tr?ng na?y l do co? qua? nhi?u axit uric trong c? th? gy ra. Axit uric la? m?t ch?t ha h?c hi?nh tha?nh khi c? th? phn h?y ca?c ch?t c tn la? purine. Purine c vai tr quan tro?ng  trong vi?c ta?o ra ca?c protein trong c? th?. Khi c? th? co? qua? nhi?u axit uric, nh??ng tinh th? s??c nho?n co? th? hi?nh tha?nh v tch t? bn trong cc kh??p. Tnh tr?ng ny gy ?au v s?ng. Ca?c c?n gu?t co? th? xa?y ra nhanh cho?ng va? c th? r?t ?au (gu?t c?p ti?nh). Theo th?i gian, ca?c c?n ? co? th? a?nh h??ng ??n nhi?u kh??p h?n va? tr?? nn th???ng xuyn h?n (gu?t ma?n ti?nh). Gu?t cu?ng co? th? la?m cho axit uric ti?ch tu? d???i da va? bn trong th?n. Nguyn nhn g gy ra? B?nh na?y l do co? qua? nhi?u axit uric trong ma?u gy ra. ?i?u ny c th? x?y ra do:  Th?n cu?a quy? vi? khng ?o tha?i ?u? axit uric ra kho?i ma?u cu?a quy? vi?. ?y l nguyn nhn ph? bi?n nh?t.  C? th? quy? vi? ta?o ra qua? nhi?u a xi?t uric. Ti?nh tra?ng na?y co? th? xa?y ra khi bi? m?t s? loa?i ung th? ho??c ?i?u tri? ung th?. No? cu?ng co? th? xa?y ra n?u c? th? quy? vi? phn h?y qua? nhi?u h?ng c?u (thi?u  ma?u tan huy?t).  Quy? vi? ?n qua? nhi?u th??c ph?m co? nhi?u purine. Nh??ng th??c ph?m na?y bao g?m n?i ta?ng va? m?t s? loa?i ha?i sa?n. R???u, ???c bi?t la? bia, cu?ng co? nhi?u purine. M?t c?n gu?t co? th? kh??i pha?t do ch?n th??ng ho??c c?ng th??ng. ?i?u g lm t?ng nguy c?? Tnh tr?ng ny d? c kh? n?ng x?y ra h?n n?u qu v?:  C ti?n s? gia ?nh b? b?nh gu?t.  La? nam gi??i va? ? ?? tu?i trung nin.  La? n?? gi??i va? ?a? ma?n kinh.  Bo ph.  Th???ng xuyn u?ng r???u, ???c bi?t la? bia.  Bi? m?t n???c.  Su?t cn qua? nhanh.  Co? m?t t?ng c?y ghe?p.  Bi? ng? ??c chi?.  Du?ng m?t s? loa?i thu?c nh?t ??nh, bao g?m aspirin, cyclosporine, l??i ti?u, levodopa va? niacin.  B? b?nh th?n.  B? m?t tnh tr?ng v? da c tn l b?nh v?y n?n. Cc d?u hi?u ho?c tri?u ch?ng l g? M?t c?n gu?t c?p xa?y ra nhanh cho?ng. No? th???ng xa?y ra chi? ?? m?t kh??p x??ng. Ch? ph? bi?n nh?t la? ngo?n chn ca?i. Ca?c c?n gu?t th???ng b??t  ??u va?o ban ?m. Nh??ng kh??p kha?c co? th? bi? a?nh h???ng bao g?m cc kh??p ? ba?n chn, c? chn, ??u g?i, cc ngo?n tay, c? tay ho??c khuy?u tay. Nh?ng tri?u ch?ng c?a tnh tr?ng ny c th? bao g?m:  ?au r?t nhi?u.  ?m.  S?ng n?.  C?ng kh?p.  Nh?y c?m ?au. Kh??p bi? a?nh h???ng co? th? r?t ?au khi ch?m va?o.  Da bo?ng, ?o? ho??c ?? ti?a.  ?n l?nh v s?t. B?nh gu?t ma?n ti?nh co? th? gy tri?u ch??ng th???ng xuyn h?n. Nhi?u kh??p co? th? bi? a?nh h???ng h?n. Quy? vi? cu?ng co? th? co? c?c mu tr??ng ho??c va?ng (h?t tophi) ?? ba?n tay ho??c ba?n chn ho??c ?? nh??ng khu v??c kha?c g?n cc kh??p. Ch?n ?on tnh tr?ng ny nh? th? no? Tnh tr?ng ny c th? ???c ch?n ?on d?a vo tri?u ch??ng, khai thc b?nh s? v khm th?c th?Sander Nephew v? c th? ph?i lm cc ki?m tra, ch?ng h?n nh?:  Xe?t nghi?m ma?u ?? ?o n?ng ?? axit uric.  L?y di?ch kh??p b??ng m?t m?i kim m?nh (hu?t di?ch) ?? ti?m ca?c tinh th? axit uric.  Chu?p x quang ?? ti?m th??ng t?n ? kh??p. Tnh tr?ng ny ???c ?i?u tr? nh? th? no? ?i?u tri? tnh tr?ng na?y co? hai giai ?oa?n: ?i?u tri? c?n c?p ti?nh va? ng?n ng?a nh??ng c?n kha?c trong t??ng lai. ?i?u tri? c?n gu?t c?p ti?nh co? th? bao g?m du?ng thu?c gia?m ?au va? gi?m s?ng, bao g?m:  Thu?c ch?ng vim khng steroid (NSAID).  Thu?c steroids. ?y la? cc thu?c ch?ng vim ma?nh ma? co? th? du?ng qua ????ng mi?ng (????ng u?ng) ho??c tim va?o kh??p.  Thu?c Colchicine. Thu?c na?y la?m gia?m ?au va? gi?m s?ng khi ????c du?ng ngay sau m?t c?n gu?t. Thu?c na?y cu?ng co? th? ????c du?ng qua ????ng u?ng ho??c qua m?t ???ng truy?n t?nh m?ch (IV). ?i?u tr? pho?ng ng??a c th? bao g?m:  Ha?ng nga?y s?? du?ng ca?c li?u thu?c ch?ng vim khng steroid (NSAID) ho??c colchicine nh? h?n.  S?? du?ng thu?c la?m gia?m n?ng ?? axit uric trong ma?u cu?a quy? vi?.  Thay ??i ch? ?? ?n. Qu v? c th? c?n g?p chuyn gia dinh d??ng ?? bi?t nn ?n v  u?ng nh?ng g ?? ng?n ng?a gt. Tun th? nh?ng h??ng d?n  ny ? nh: Trong m?t c?n gu?t   N?u ???c ch? d?n, hy ch??m ? l?nh ln vng b? ?nh h??ng: ? Cho ? l?nh vo ti ni lng. ? ?? kh?n t?m ? gi?a da v ti ch??m. ? Ch??m ? l?nh trong 20 pht, 2-3 l?n m?i ngy.  Nng (nng cao) kh??p bi? a?nh h???ng ln trn m??c cu?a tim ca?ng th???ng xuyn ca?ng t?t.  ?? kh?p ngh? ng?i cng nhi?u cng t?t. N?u kh??p bi? a?nh h???ng ?? chn, quy? vi? co? th? ????c cho du?ng na?ng.  Tun th? ch? d?n c?a chuyn gia ch?m [place] s?c kh?e v? cc h?n ch? ?n ho?c u?ng. Tra?nh cc c?n gu?t trong t??ng lai  Tun thu? ch? ?? ?n i?t purine theo chi? d?n cu?a chuyn gia dinh d???ng ho??c chuyn gia ch?m so?c s??c kho?e. Tra?nh nh??ng th??c ph?m va? ?? u?ng co? nhi?u purine, bao g?m gan, th?n, ca? tr?ng, m?ng ty, ca? tri?ch, n?m, con trai va? bia.  Duy tri? cn n??ng co? l??i cho s??c kho?e ho??c gia?m cn n?u quy? vi? ?ang th??a cn. N?u quy? vi? mu?n gia?m cn, ha?y no?i chuy?n v??i chuyn gia ch?m so?c s??c kho?e. ?i?u quan tro?ng la? quy? vi? khng ????c gia?m cn qua? nhanh.  B??t ??u ho??c duy tri? m?t ch??ng tri?nh t?p th? du?c theo chi? d?n cu?a chuyn gia ch?m so?c s??c kho?e. ?n v u?ng  U?ng ?? n??c ?? gi? cho n??c ti?u c mu vng nh?t.  N?u qu v? u?ng r??u: ? Gi?i h?n l??ng r??u qu v? u?ng ? m?c:  0-1 ly/ngy ??i v?i ph? n?.  0-2 ly/ngy ??i v?i nam gi?i. ? Bi?t r m?t ly c bao nhiu r??u. ? M?, m?t ly t??ng ???ng v?i m?t chai bia 12 ao x? (355 mL), m?t ly r??u vang 5 ao x? (148 mL), ho?c m?t ly r??u m?nh 1 ao x? (44 mL). H??ng d?n chung  Ch? s? d?ng thu?c khng k ??n v thu?c k ??n theo ch? d?n c?a chuyn gia ch?m [place] s?c kh?e.  Khng li xe ho?c s? d?ng my mc h?ng n?ng trong khi dng thu?c gi?m ?au k ??n.  Tr? l?i sinh ho?t bnh th??ng theo ch? d?n c?a chuyn gia ch?m [place] s?c kh?e. Hy h?i chuyn gia ch?m [place] s?c kh?e v? cc ho?t ??ng no l an ton cho  qu v?.  Tun th? t?t c? cc l?n khm theo di theo ch? d?n c?a chuyn gia ch?m [place] s?c kh?e. ?i?u ny c vai tr quan tr?ng. Hy lin l?c v?i chuyn gia ch?m [place] s?c kh?e n?u qu v? b?:  M?t c?n gt khc.  Ti?p t?c c cc tri?u ch?ng cu?a c?n gu?t sau 10 nga?y ?i?u tr?.  Tc d?ng ph? c?a thu?c.  ?n l?nh ho?c s?t.  ?au ra?t khi ?i ti?u.  ?au ?? th??t l?ng ho??c ? bu?ng. Yu c?u tr? gip ngay l?p t?c n?u qu v?:  B? ?au r?t nhi?u ho??c ?au khng ki?m soa?t ????c.  Khng th? ?i ti?u ???c. Tm t?t  B?nh gt gy s?ng ?au ? cc kh?p do vim gy ra.  V? tr ?au ph? bi?n nh?t l ngn chn ci, nh?ng ?au c th? ?nh h??ng ??n cc kh?p khc trong c? th?.  Thu?c v thay ??i ch? ?? ?n u?ng c th? gip ng?n ng?a v ?i?u tr? cc c?n gt. Thng tin ny khng nh?m m?c ?ch thay th? cho l?i khuyn m chuyn gia ch?m [place] s?c kh?e ni v?i qu  v?. Hy b?o ??m qu v? ph?i th?o lu?n b?t k? v?n ?? g m qu v? c v?i chuyn gia ch?m [place] s?c kh?e c?a qu v?. Document Released: 10/30/2004 Document Revised: 09/21/2017 Document Reviewed: 09/21/2017 Elsevier Patient Education  [employer].    If you have lab work done today you will be contacted with your lab results within the next 2 weeks.  If you have not heard from [place] then please contact us. The fastest way to get your results is to register for My Chart.   IF you received an x-ray today, you will receive an invoice from [facility]. Please contact [facility] at [phone] with questions or concerns regarding your invoice.   IF you received labwork today, you will receive an invoice from [facility]. Please contact [facility] at [phone] with questions or concerns regarding your invoice.   Our billing staff will not be able to assist you with questions regarding bills from these companies.  You will be contacted with the lab results as soon as they are available. The fastest way to get your results is to activate your  My Chart account. Instructions are located on the last page of this paperwork. If you have not heard from [place] regarding the results in 2 weeks, please contact this office.

## 2018-12-10 DIAGNOSIS — Z20828 Contact with and (suspected) exposure to other viral communicable diseases: Secondary | ICD-10-CM | POA: Diagnosis not present

## 2018-12-11 ENCOUNTER — Emergency Department (HOSPITAL_COMMUNITY)
Admission: EM | Admit: 2018-12-11 | Discharge: 2018-12-11 | Disposition: A | Discharge status: Home / Self Care | Payer: Medicare HMO | Attending: Emergency Medicine | Admitting: Emergency Medicine

## 2018-12-11 ENCOUNTER — Other Ambulatory Visit: Payer: Self-pay

## 2018-12-11 DIAGNOSIS — N183 Chronic kidney disease, stage 3 unspecified: Secondary | ICD-10-CM | POA: Diagnosis not present

## 2018-12-11 DIAGNOSIS — M109 Gout, unspecified: Secondary | ICD-10-CM

## 2018-12-11 DIAGNOSIS — Z79899 Other long term (current) drug therapy: Secondary | ICD-10-CM | POA: Insufficient documentation

## 2018-12-11 DIAGNOSIS — R52 Pain, unspecified: Secondary | ICD-10-CM | POA: Diagnosis not present

## 2018-12-11 DIAGNOSIS — I129 Hypertensive chronic kidney disease with stage 1 through stage 4 chronic kidney disease, or unspecified chronic kidney disease: Secondary | ICD-10-CM | POA: Insufficient documentation

## 2018-12-11 DIAGNOSIS — M25562 Pain in left knee: Secondary | ICD-10-CM | POA: Diagnosis present

## 2018-12-11 DIAGNOSIS — Z87891 Personal history of nicotine dependence: Secondary | ICD-10-CM | POA: Insufficient documentation

## 2018-12-11 DIAGNOSIS — R609 Edema, unspecified: Secondary | ICD-10-CM | POA: Diagnosis not present

## 2018-12-11 LAB — SYNOVIAL CELL COUNT + DIFF, W/ CRYSTALS
Lymphocytes-Synovial Fld: 9 % (ref 0–20)
Monocyte-Macrophage-Synovial Fluid: 1 % — ABNORMAL LOW (ref 50–90)
Neutrophil, Synovial: 90 % — ABNORMAL HIGH (ref 0–25)
WBC, Synovial: 66360 /mm3 — ABNORMAL HIGH (ref 0–200)

## 2018-12-11 LAB — CBG MONITORING, ED: Glucose-Capillary: 221 mg/dL — ABNORMAL HIGH (ref 70–99)

## 2018-12-11 LAB — HEMOGLOBIN A1C
Hgb A1c MFr Bld: 6.7 % — ABNORMAL HIGH (ref 4.8–5.6)
Mean Plasma Glucose: 145.59 mg/dL

## 2018-12-11 MED ORDER — MORPHINE SULFATE (PF) 4 MG/ML IV SOLN
4.0000 mg | Freq: Once | INTRAVENOUS | Status: AC
  Administered 2018-12-11: 4 mg via INTRAMUSCULAR
  Filled 2018-12-11: qty 1

## 2018-12-11 MED ORDER — LIDOCAINE-EPINEPHRINE 2 %-1:100000 IJ SOLN
20.0000 mL | Freq: Once | INTRAMUSCULAR | Status: AC
  Administered 2018-12-11: 20 mL via INTRADERMAL
  Filled 2018-12-11: qty 1

## 2018-12-11 MED ORDER — HYDROCODONE-ACETAMINOPHEN 5-325 MG PO TABS: 1.0000 | ORAL_TABLET | ORAL | 0 refills | Status: DC | PRN

## 2018-12-11 MED ORDER — PREDNISONE 20 MG PO TABS: 40.0000 mg | ORAL_TABLET | Freq: Every day | ORAL | 0 refills | Status: DC

## 2018-12-11 MED ORDER — PREDNISONE 20 MG PO TABS
60.0000 mg | ORAL_TABLET | Freq: Once | ORAL | Status: AC
  Administered 2018-12-11: 60 mg via ORAL
  Filled 2018-12-11: qty 3

## 2018-12-11 MED ORDER — HYDROCODONE-ACETAMINOPHEN 5-325 MG PO TABS
1.0000 | ORAL_TABLET | Freq: Once | ORAL | Status: AC
  Administered 2018-12-11: 1 via ORAL
  Filled 2018-12-11: qty 1

## 2018-12-11 NOTE — ED Provider Notes (Signed)
[facility] DEPT Provider Note   CSN: [id number] Arrival date & time: 12/11/18  0645     History   Chief Complaint Chief Complaint  Patient presents with  . Knee Pain    HPI Reginald Long is a 78 y.o. male with history of gout and arthritis who presents with knee pain. He speaks [place] and very limited English so his granddaughter is at bedside and helps translate. He states that he's had knee pain for a long time but it's gotten worse recently. He saw his doctor on 10/1 and took a round of Prednisone which temporarily helped. Over the past 2 days his pain has gradually worsening and he reports associated swelling. He also is having bilateral shoulder pain but this is not as bad as his knee. He denies fever, redness, trauma. He is not taking Allopurinol although is prescribed this. He has never had his knee tapped or received injections   HPI  Past Medical History:  Diagnosis Date  . Gout   . Hyperlipidemia   . Hypertension   . PAF (paroxysmal atrial fibrillation) ([place]) 12/08/2016    Patient Active Problem List   Diagnosis Date Noted  . Acute appendicitis 12/08/2016  . Non-English speaking patient [name] 12/08/2016  . Right renal mass 12/08/2016  . CKD (chronic kidney disease), stage III 12/08/2016  . PAF (paroxysmal atrial fibrillation) ([place]) 12/08/2016  . Acute gangrenous appendicitis with perforation and peritonitis 12/08/2016  . Viral enteritis 12/04/2015  . Hyperlipidemia 12/04/2015  . Essential hypertension 12/04/2015  . Right foot pain 12/04/2015  . Acute gout of right ankle 12/04/2015  . Melanoma of skin, site unspecified 10/26/2013    Past Surgical History:  Procedure Laterality Date  . LAPAROSCOPIC APPENDECTOMY N/A 12/08/2016   Procedure: APPENDECTOMY LAPAROSCOPIC;  Surgeon: [name], MD;  Location: [facility] ORS;  Service: General;  Laterality: N/A;  . right thumb surgery          Home Medications    Prior to Admission  medications   Medication Sig Start Date End Date Taking? Authorizing Provider  allopurinol (ZYLOPRIM) 100 MG tablet Take 1 tablet (100 mg total) by mouth daily. 11/04/18   Wendie Agreste, MD  amLODipine (NORVASC) 2.5 MG tablet Take 1 tablet (2.5 mg total) by mouth daily. 11/04/18   Wendie Agreste, MD  atorvastatin (LIPITOR) 10 MG tablet Take 1 tablet (10 mg total) by mouth daily at 6 PM. 11/04/18   Wendie Agreste, MD  colchicine 0.6 MG tablet Take two tablets at onset of flare up then 0.6mg  one hour later. 10/13/18   Wendie Agreste, MD  lisinopril (ZESTRIL) 20 MG tablet TAKE 1 TABLET(20 MG) BY MOUTH DAILY 10/13/18   Wendie Agreste, MD  predniSONE (DELTASONE) 20 MG tablet Take 2 tablets (40 mg total) by mouth daily with breakfast. 11/04/18   Wendie Agreste, MD    Family History No family history on file.  Social History Social History   Tobacco Use  . Smoking status: Former Research scientist (life sciences)  . Smokeless tobacco: Never Used  Substance Use Topics  . Alcohol use: No  . Drug use: No     Allergies   Patient has no known allergies.   Review of Systems Review of Systems  Constitutional: Negative for fever.  Musculoskeletal: Positive for arthralgias and joint swelling.     Physical Exam Updated Vital Signs BP 139/68 (BP Location: Left Arm)   Pulse 66   Temp 98.9 F (37.2 C) (Oral)  Resp 16   SpO2 100%   Physical Exam Vitals signs and nursing note reviewed.  Constitutional:      General: He is not in acute distress.    Appearance: Normal appearance. He is well-developed. He is not ill-appearing.     Comments: Cooperative. In pain  HENT:     Head: Normocephalic and atraumatic.  Eyes:     General: No scleral icterus.       Right eye: No discharge.        Left eye: No discharge.     Conjunctiva/sclera: Conjunctivae normal.     Pupils: Pupils are equal, round, and reactive to light.  Neck:     Musculoskeletal: Normal range of motion.  Cardiovascular:     Rate and  Rhythm: Normal rate and regular rhythm.  Pulmonary:     Effort: Pulmonary effort is normal. No respiratory distress.     Breath sounds: Normal breath sounds.  Abdominal:     General: There is no distension.     Palpations: Abdomen is soft.     Tenderness: There is no abdominal tenderness.  Musculoskeletal:     Comments: Bilateral shoulder: No deformity, redness, or swelling. Diffuse tenderness. Decreased ROM.   Left knee: Chronic deformity due to arthritis. Diffuse swelling. No redness. Unable to range secondary to pain. Intact distal pulses.  Skin:    General: Skin is warm and dry.  Neurological:     Mental Status: He is alert and oriented to person, place, and time.  Psychiatric:        Behavior: Behavior normal.      ED Treatments / Results  Labs (all labs ordered are listed, but only abnormal results are displayed) Labs Reviewed - No data to display  EKG None  Radiology No results found.  Procedures .Joint Aspiration/Arthrocentesis  Date/Time: 12/11/2018 7:54 AM Performed by: Recardo Evangelist, PA-C Authorized by: Recardo Evangelist, PA-C   Consent:    Consent obtained:  Verbal   Consent given by:  Patient   Risks discussed:  Bleeding, infection and pain   Alternatives discussed:  No treatment Location:    Location:  Knee   Knee:  L knee Anesthesia (see MAR for exact dosages):    Anesthesia method:  Local infiltration   Local anesthetic:  Lidocaine 2% WITH epi Procedure details:    Preparation: Patient was prepped and draped in usual sterile fashion     Needle gauge: 25.   Ultrasound guidance: yes     Approach:  Lateral   Aspirate amount:  18   Aspirate characteristics:  Cloudy and yellow   Steroid injected: no     Specimen collected: yes   Post-procedure details:    Dressing:  Adhesive bandage and sterile dressing   Patient tolerance of procedure:  Tolerated well, no immediate complications   (including critical care time)    Medications  Ordered in ED Medications  morphine 4 MG/ML injection 4 mg (has no administration in time range)  lidocaine-EPINEPHrine (XYLOCAINE W/EPI) 2 %-1:100000 (with pres) injection 20 mL (20 mLs Intradermal Given by Other 12/11/18 0725)     Initial Impression / Assessment and Plan / ED Course  I have reviewed the triage vital signs and the nursing notes.  Pertinent labs & imaging results that were available during my care of the patient were reviewed by me and considered in my medical decision making (see chart for details).  78 year old male with acutely swollen and painful left knee for the past 2  days.  This is acute on chronic problem.  There is been no trauma.  No recent injections.  The knee is swollen but not red to suggest septic joint.  Will perform knee arthrocentesis  Knee arthrocentesis was performed. ~18cc of cloudy yellow fluid was pulled off and will send off for testing.  Synovial fluid analysis shows white blood cell count is 66,000 with 90% neutrophils.  There are urate crystals in the synovial fluid.  Will discuss with orthopedics   Discussed with Dr. Erlinda Hong. He recommends tx with steroids and colchicine and office f/u. Shared visit with Dr. Maryan Rued. Will tx with Prednisone. He is already on Colchicine. Will send of A1c due to elevated CBG here and recommend PCP f/u regarding this.  Pt family requested a rx for wheelchair which was provided. They were given rx for pain medicine and prednisone and instructed to f/u with Dr. Erlinda Hong  Final Clinical Impressions(s) / ED Diagnoses   Final diagnoses:  Acute gout of left knee, unspecified cause    ED Discharge Orders    None       Recardo Evangelist, PA-C 12/12/18 [id number]    Blanchie Dessert, MD 12/12/18 1513

## 2018-12-11 NOTE — ED Triage Notes (Signed)
Per EMS: Pt is coming from home with a c/o left knee pain. Pt has swelling and tenderness in left knee and was not able to walk with EMS.  EMS VITALS: BP 134/76 HR 68 RR 16 SPO2 98% RA TEMP 97.4

## 2018-12-11 NOTE — Discharge Instructions (Signed)
Take Norco as needed for severe pain Take Prednisone for the next 5 days Please see your doctor to have your blood sugar rechecked Please follow up with Dr. Erlinda Hong in the office next week

## 2018-12-12 LAB — GLUCOSE, BODY FLUID OTHER: Glucose, Body Fluid Other: 44 mg/dL

## 2018-12-15 LAB — BODY FLUID CULTURE: Culture: NO GROWTH

## 2018-12-16 ENCOUNTER — Encounter: Payer: Self-pay | Admitting: Family Medicine

## 2018-12-16 ENCOUNTER — Ambulatory Visit (INDEPENDENT_AMBULATORY_CARE_PROVIDER_SITE_OTHER): Payer: Medicare HMO | Admitting: Family Medicine

## 2018-12-16 ENCOUNTER — Other Ambulatory Visit: Payer: Self-pay

## 2018-12-16 VITALS — BP 150/71 | HR 61 | Temp 98.4°F | Wt 134.6 lb

## 2018-12-16 DIAGNOSIS — E1165 Type 2 diabetes mellitus with hyperglycemia: Secondary | ICD-10-CM | POA: Diagnosis not present

## 2018-12-16 DIAGNOSIS — M1712 Unilateral primary osteoarthritis, left knee: Secondary | ICD-10-CM | POA: Diagnosis not present

## 2018-12-16 DIAGNOSIS — M25562 Pain in left knee: Secondary | ICD-10-CM | POA: Diagnosis not present

## 2018-12-16 DIAGNOSIS — M109 Gout, unspecified: Secondary | ICD-10-CM

## 2018-12-16 DIAGNOSIS — I10 Essential (primary) hypertension: Secondary | ICD-10-CM

## 2018-12-16 DIAGNOSIS — R739 Hyperglycemia, unspecified: Secondary | ICD-10-CM

## 2018-12-16 DIAGNOSIS — E785 Hyperlipidemia, unspecified: Secondary | ICD-10-CM

## 2018-12-16 LAB — GLUCOSE, POCT (MANUAL RESULT ENTRY): POC Glucose: 394 mg/dl — AB (ref 70–99)

## 2018-12-16 MED ORDER — AMLODIPINE BESYLATE 5 MG PO TABS: 5.0000 mg | ORAL_TABLET | Freq: Every day | ORAL | 1 refills | Status: DC

## 2018-12-16 MED ORDER — METFORMIN HCL 500 MG PO TABS: 500.0000 mg | ORAL_TABLET | Freq: Two times a day (BID) | ORAL | 1 refills | Status: DC

## 2018-12-16 MED ORDER — HYDROCODONE-ACETAMINOPHEN 5-325 MG PO TABS: 1.0000 | ORAL_TABLET | ORAL | 0 refills | Status: DC | PRN

## 2018-12-16 NOTE — Progress Notes (Signed)
Subjective:  Patient ID: Roverto Jared, male    DOB: [birth year]  Age: 78 y.o. MRN: [id number]  CC:  Chief Complaint  Patient presents with  . Hypertension    6 week f/u on blood pressure and gout. Labs was done on 12/11/18 at the ER. Patient went to the ER on 12/11/18 due to the pain was so bad. They put him on meds for the gout/pain which help a little but still imflammed.Patient would like a refill on pain med  here with grandson - will be translating - vietnamese.  Understanding expressed - declined video intrerpreter.   HPI Raymon [place] presents for   Hypertension: Uncontrolled at October 1 visit.  Was continued on lisinopril 20 mg daily but added amlodipine 2.5 mg daily. Taking meds Home readings: 150/75-80.  No new side effects with amlodipine.  BP Readings from Last 3 Encounters:  12/16/18 (!) 150/71  12/11/18 (!) 159/86  11/04/18 (!) 176/76   Lab Results  Component Value Date   CREATININE 1.11 10/13/2018   Hyperlipidemia: Started on Lipitor 10 mg daily October 1st.  Taking QD, no new side effects.  Lab Results  Component Value Date   CHOL 323 (H) 10/13/2018   HDL 62 10/13/2018   LDLCALC 204 (H) 10/13/2018   TRIG 285 (H) 10/13/2018   CHOLHDL 5.2 (H) 10/13/2018   Lab Results  Component Value Date   ALT 47 (H) 10/13/2018   AST 37 10/13/2018   ALKPHOS 82 10/13/2018   BILITOT 0.3 10/13/2018   Gout Lab Results  Component Value Date   LABURIC 11.3 (H) 10/13/2018  Right heel pain, left knee pain discussed at October 1 visit.  He was taking colchicine twice per day.  significant osteoarthritis of left knee plus flare of gout in the right heel and knee at that time.  Treated with prednisone 40 mg daily for 5 days.  Recommended starting allopurinol once his flares have resolved for at least a few weeks given significant elevated uric acid.  Treated for gout flare in his left knee on November 7 at [facility] long ER.  Knee was aspirated, likely inflammatory with cell count, crystals.   Treated with 5 days of prednisone at 40 mg daily, hydrocodone.  Did start allopurinol - still taking allopurinol once per day.  Knee pain is improving. Able to walk more now, still difficulty bending.  Taking hydrocodone as needed - 2 per day. RF requested. Controlled substance database (PDMP) reviewed. No concerns appreciated. Last filled 11/7 for # 12.   Today is last day of prednisone.   XR L knee 7/27: FINDINGS: Normal anatomic alignment. No evidence for acute fracture or dislocation. Tricompartmental osteophytosis. Small suprapatellar joint effusion.  IMPRESSION: Tricompartmental degenerative changes.  Small joint effusion.  Hyperglycemia: Reading of 221 in the ER on November 7.  Elevated A1c at level of diabetes as below.  Was not started on new medicines, but has been treated with prednisone as above.  Glucose has been normal on January 20, 2018, September 9 of this year.  Prior did have elevated readings.  Lab Results  Component Value Date   HGBA1C 6.7 (H) 12/11/2018     History Patient Active Problem List   Diagnosis Date Noted  . Acute appendicitis 12/08/2016  . Non-English speaking patient [name] 12/08/2016  . Right renal mass 12/08/2016  . CKD (chronic kidney disease), stage III 12/08/2016  . PAF (paroxysmal atrial fibrillation) ([place]) 12/08/2016  . Acute gangrenous appendicitis with perforation and peritonitis 12/08/2016  .  Viral enteritis 12/04/2015  . Hyperlipidemia 12/04/2015  . Essential hypertension 12/04/2015  . Right foot pain 12/04/2015  . Acute gout of right ankle 12/04/2015  . Melanoma of skin, site unspecified 10/26/2013   Past Medical History:  Diagnosis Date  . Gout   . Hyperlipidemia   . Hypertension   . PAF (paroxysmal atrial fibrillation) ([place]) 12/08/2016   Past Surgical History:  Procedure Laterality Date  . LAPAROSCOPIC APPENDECTOMY N/A 12/08/2016   Procedure: APPENDECTOMY LAPAROSCOPIC;  Surgeon: [name], MD;  Location: [facility]  ORS;  Service: General;  Laterality: N/A;  . right thumb surgery     No Known Allergies Prior to Admission medications   Medication Sig Start Date End Date Taking? Authorizing Provider  allopurinol (ZYLOPRIM) 100 MG tablet Take 1 tablet (100 mg total) by mouth daily. 11/04/18  Yes Wendie Agreste, MD  amLODipine (NORVASC) 2.5 MG tablet Take 1 tablet (2.5 mg total) by mouth daily. 11/04/18  Yes Wendie Agreste, MD  atorvastatin (LIPITOR) 10 MG tablet Take 1 tablet (10 mg total) by mouth daily at 6 PM. 11/04/18  Yes Wendie Agreste, MD  colchicine 0.6 MG tablet Take two tablets at onset of flare up then 0.6mg  one hour later. 10/13/18  Yes Wendie Agreste, MD  HYDROcodone-acetaminophen (NORCO/VICODIN) 5-325 MG tablet Take 1 tablet by mouth every 4 (four) hours as needed. 12/11/18  Yes Recardo Evangelist, PA-C  lisinopril (ZESTRIL) 20 MG tablet TAKE 1 TABLET(20 MG) BY MOUTH DAILY 10/13/18  Yes Wendie Agreste, MD  predniSONE (DELTASONE) 20 MG tablet Take 2 tablets (40 mg total) by mouth daily. 12/11/18  Yes Recardo Evangelist, PA-C   Social History   Socioeconomic History  . Marital status: Married    Spouse name: Not on file  . Number of children: Not on file  . Years of education: Not on file  . Highest education level: Not on file  Occupational History  . Not on file  Social Needs  . Financial resource strain: Not on file  . Food insecurity    Worry: Not on file    Inability: Not on file  . Transportation needs    Medical: Not on file    Non-medical: Not on file  Tobacco Use  . Smoking status: Former Research scientist (life sciences)  . Smokeless tobacco: Never Used  Substance and Sexual Activity  . Alcohol use: No  . Drug use: No  . Sexual activity: Not on file  Lifestyle  . Physical activity    Days per week: Not on file    Minutes per session: Not on file  . Stress: Not on file  Relationships  . Social Herbalist on phone: Not on file    Gets together: Not on file    Attends  religious service: Not on file    Active member of club or organization: Not on file    Attends meetings of clubs or organizations: Not on file    Relationship status: Not on file  . Intimate partner violence    Fear of current or ex partner: Not on file    Emotionally abused: Not on file    Physically abused: Not on file    Forced sexual activity: Not on file  Other Topics Concern  . Not on file  Social History Narrative   Originally from [place]    Review of Systems  Eyes: Negative for visual disturbance.  Gastrointestinal: Negative for abdominal pain, nausea and vomiting.  other  per HPI   Objective:   Vitals:   12/16/18 1007 12/16/18 1009  BP: (!) 163/81 (!) 150/71  Pulse: 61   Temp: 98.4 F (36.9 C)   TempSrc: Oral   SpO2: 96%   Weight: 134 lb 9.6 oz (61.1 kg)      Physical Exam Vitals signs reviewed.  Constitutional:      Appearance: He is well-developed.  HENT:     Head: Normocephalic and atraumatic.  Eyes:     Pupils: Pupils are equal, round, and reactive to light.  Neck:     Vascular: No carotid bruit or JVD.  Cardiovascular:     Rate and Rhythm: Normal rate and regular rhythm.     Heart sounds: Normal heart sounds. No murmur.  Pulmonary:     Effort: Pulmonary effort is normal.     Breath sounds: Normal breath sounds. No rales.  Musculoskeletal:     Left knee: He exhibits decreased range of motion, swelling and effusion (1+. ). He exhibits no ecchymosis, no deformity and no erythema. Tenderness found. Medial joint line (with bony prominence medially. ) tenderness noted.  Skin:    General: Skin is warm and dry.  Neurological:     Mental Status: He is alert and oriented to person, place, and time.     Results for orders placed or performed in visit on 12/16/18  POCT glucose (manual entry)  Result Value Ref Range   POC Glucose 394 (A) 70 - 99 mg/dl     Assessment & Plan:  Zyire Starzec is a 78 y.o. male . Gout of multiple sites, unspecified cause,  unspecified chronicity - Plan: Ambulatory referral to Orthopedic Surgery Acute pain of left knee - Plan: Ambulatory referral to Orthopedic Surgery Osteoarthritis of left knee, unspecified osteoarthritis type - Plan: Ambulatory referral to Orthopedic Surgery  -Suspect component of gout as well as tricompartmental arthritis.  Refer to Ortho for evaluation, recent gout flare improving with prednisone.  Hydrocodone refilled temporarily with RTC precautions.  Continue allopurinol.  Essential hypertension - Plan: amLODipine (NORVASC) 5 MG tablet  -Decreased control, increase amlodipine to 5 mg daily, continue lisinopril same dose.  Hyperlipidemia, unspecified hyperlipidemia type  -Tolerating Lipitor, plan on recheck labs next few weeks.  Hyperglycemia - Plan: POCT glucose (manual entry) Type 2 diabetes mellitus with hyperglycemia, without long-term current use of insulin ([place]) - Plan: metFORMIN (GLUCOPHAGE) 500 MG tablet  -Suspect prednisone has contributed.  Last dose today.  Start Metformin 500 mg twice daily, ER precautions given if symptomatic hyperglycemia.  Recheck 2 weeks  No orders of the defined types were placed in this encounter.  Patient Instructions     Blood pressure still too high. Increase amlodipine to 5mg  per day.  Continue Lipitor once per day for cholesterol, continue allopurinol once per day for gout.   I expect gout flare to continue to improve after prednisone today.  Hydrocodone refilled for the next few days if needed.  If area starts to worsen, may need to restart prednisone.  Let me know.  I also referred you to the orthopedist as some of the knee pain may be related to the arthritis, not just gout.  Blood sugar is higher today - should improve off prednisone, but start metformin twice per day for now and recheck in 2 weeks. If any blurry vision, abdominal pain, vomiting or worse symptoms go to ER.   Return to the clinic or go to the nearest emergency room if any of  your symptoms worsen  or new symptoms occur.   If you have lab work done today you will be contacted with your lab results within the next 2 weeks.  If you have not heard from [place] then please contact us. The fastest way to get your results is to register for My Chart.   IF you received an x-ray today, you will receive an invoice from [facility] Rad[facility]. Please contact [facility] Rad[facility] at [phone] with questions or concerns regarding your invoice.   IF you received labwork today, you will receive an invoice from [place]. Please contact [facility] at [phone] with questions or concerns regarding your invoice.   Our billing staff will not be able to assist you with questions regarding bills from these companies.  You will be contacted with the lab results as soon as they are available. The fastest way to get your results is to activate your My Chart account. Instructions are located on the last page of this paperwork. If you have not heard from [place] regarding the results in 2 weeks, please contact this office.          Signed, Merri Ray, MD [facility]

## 2018-12-16 NOTE — Patient Instructions (Addendum)
   Blood pressure still too high. Increase amlodipine to 5mg  per day.  Continue Lipitor once per day for cholesterol, continue allopurinol once per day for gout.   I expect gout flare to continue to improve after prednisone today.  Hydrocodone refilled for the next few days if needed.  If area starts to worsen, may need to restart prednisone.  Let me know.  I also referred you to the orthopedist as some of the knee pain may be related to the arthritis, not just gout.  Blood sugar is higher today - should improve off prednisone, but start metformin twice per day for now and recheck in 2 weeks. If any blurry vision, abdominal pain, vomiting or worse symptoms go to ER.   Return to the clinic or go to the nearest emergency room if any of your symptoms worsen or new symptoms occur.   If you have lab work done today you will be contacted with your lab results within the next 2 weeks.  If you have not heard from [place] then please contact us. The fastest way to get your results is to register for My Chart.   IF you received an x-ray today, you will receive an invoice from [facility]. Please contact [facility] Radiology at [phone] with questions or concerns regarding your invoice.   IF you received labwork today, you will receive an invoice from [place]. Please contact [facility] at [phone] with questions or concerns regarding your invoice.   Our billing staff will not be able to assist you with questions regarding bills from these companies.  You will be contacted with the lab results as soon as they are available. The fastest way to get your results is to activate your My Chart account. Instructions are located on the last page of this paperwork. If you have not heard from [place] regarding the results in 2 weeks, please contact this office.

## 2018-12-23 ENCOUNTER — Ambulatory Visit: Payer: Medicare HMO | Admitting: Orthopaedic Surgery

## 2019-01-03 ENCOUNTER — Ambulatory Visit (INDEPENDENT_AMBULATORY_CARE_PROVIDER_SITE_OTHER): Payer: Medicare HMO | Admitting: Family Medicine

## 2019-01-03 ENCOUNTER — Encounter: Payer: Self-pay | Admitting: Family Medicine

## 2019-01-03 ENCOUNTER — Other Ambulatory Visit: Payer: Self-pay

## 2019-01-03 VITALS — BP 126/68 | HR 65 | Temp 98.2°F | Wt 127.4 lb

## 2019-01-03 DIAGNOSIS — E785 Hyperlipidemia, unspecified: Secondary | ICD-10-CM | POA: Diagnosis not present

## 2019-01-03 DIAGNOSIS — I10 Essential (primary) hypertension: Secondary | ICD-10-CM

## 2019-01-03 DIAGNOSIS — E1165 Type 2 diabetes mellitus with hyperglycemia: Secondary | ICD-10-CM

## 2019-01-03 DIAGNOSIS — R69 Illness, unspecified: Secondary | ICD-10-CM | POA: Diagnosis not present

## 2019-01-03 LAB — GLUCOSE, POCT (MANUAL RESULT ENTRY): POC Glucose: 117 mg/dl — AB (ref 70–99)

## 2019-01-03 MED ORDER — BLOOD GLUCOSE METER KIT: PACK | 0 refills | Status: AC

## 2019-01-03 MED ORDER — ATORVASTATIN CALCIUM 10 MG PO TABS: 10.0000 mg | ORAL_TABLET | Freq: Every day | ORAL | 0 refills | Status: DC

## 2019-01-03 NOTE — Patient Instructions (Addendum)
Keep a record of your blood sugars to next visit. If below 100, then can stop metfomin. Follow up in 3 months.   If you have lab work done today you will be contacted with your lab results within the next 2 weeks.  If you have not heard from [place] then please contact us. The fastest way to get your results is to register for My Chart.   IF you received an x-ray today, you will receive an invoice from [facility] Radiology. Please contact [facility] Radiology at [phone] with questions or concerns regarding your invoice.   IF you received labwork today, you will receive an invoice from [place]. Please contact [facility] at [phone] with questions or concerns regarding your invoice.   Our billing staff will not be able to assist you with questions regarding bills from these companies.  You will be contacted with the lab results as soon as they are available. The fastest way to get your results is to activate your My Chart account. Instructions are located on the last page of this paperwork. If you have not heard from [place] regarding the results in 2 weeks, please contact this office.

## 2019-01-03 NOTE — Progress Notes (Signed)
Subjective:  Patient ID: Reginald Long, male    DOB: Jul 07, 1940  Age: 78 y.o. MRN: [id number]  CC:  Chief Complaint  Patient presents with  . Follow-up    here today for blood sugar recheck. Patient have not been checking bs at home    HPI [facility] presents for  Hypertension: Decreased control last visit, amlodipine increased to 5 mg daily. No new side effects with higher dose. Still on lisinopril 20mg  daily as well.  Home readings: none.  BP Readings from Last 3 Encounters:  01/03/19 126/68  12/16/18 (!) 150/71  12/11/18 (!) 159/86   Lab Results  Component Value Date   CREATININE 1.11 10/13/2018   Diabetes: Complicated by hyperglycemia.  Elevated readings were discussed last visit, with a reading of 394 in office on November 12.  Thought that prednisone was contributing - finishing at November 12 visit.  Was started on Metformin 500 mg twice daily.  Not checking home readings.  Tolerating metformin. He is on statin, ACE inhibitor.  Results for orders placed or performed in visit on 01/03/19  POCT glucose (manual entry)  Result Value Ref Range   POC Glucose 117 (A) 70 - 99 mg/dl    Lab Results  Component Value Date   HGBA1C 6.7 (H) 12/11/2018   Lab Results  Component Value Date   LDLCALC 204 (H) 10/13/2018   CREATININE 1.11 10/13/2018    History Patient Active Problem List   Diagnosis Date Noted  . Acute appendicitis 12/08/2016  . Non-English speaking patient [name] 12/08/2016  . Right renal mass 12/08/2016  . CKD (chronic kidney disease), stage III 12/08/2016  . PAF (paroxysmal atrial fibrillation) ([place]) 12/08/2016  . Acute gangrenous appendicitis with perforation and peritonitis 12/08/2016  . Viral enteritis 12/04/2015  . Hyperlipidemia 12/04/2015  . Essential hypertension 12/04/2015  . Right foot pain 12/04/2015  . Acute gout of right ankle 12/04/2015  . Melanoma of skin, site unspecified 10/26/2013   Past Medical History:  Diagnosis Date  . Gout    . Hyperlipidemia   . Hypertension   . PAF (paroxysmal atrial fibrillation) ([place]) 12/08/2016   Past Surgical History:  Procedure Laterality Date  . LAPAROSCOPIC APPENDECTOMY N/A 12/08/2016   Procedure: APPENDECTOMY LAPAROSCOPIC;  Surgeon: [name], MD;  Location: [facility] ORS;  Service: General;  Laterality: N/A;  . right thumb surgery     No Known Allergies Prior to Admission medications   Medication Sig Start Date End Date Taking? Authorizing Provider  allopurinol (ZYLOPRIM) 100 MG tablet Take 1 tablet (100 mg total) by mouth daily. 11/04/18   Wendie Agreste, MD  amLODipine (NORVASC) 5 MG tablet Take 1 tablet (5 mg total) by mouth daily. 12/16/18   Wendie Agreste, MD  atorvastatin (LIPITOR) 10 MG tablet Take 1 tablet (10 mg total) by mouth daily at 6 PM. 11/04/18   Wendie Agreste, MD  colchicine 0.6 MG tablet Take two tablets at onset of flare up then 0.6mg  one hour later. 10/13/18   Wendie Agreste, MD  lisinopril (ZESTRIL) 20 MG tablet TAKE 1 TABLET(20 MG) BY MOUTH DAILY 10/13/18   Wendie Agreste, MD  metFORMIN (GLUCOPHAGE) 500 MG tablet Take 1 tablet (500 mg total) by mouth 2 (two) times daily with a meal. 12/16/18   Wendie Agreste, MD   Social History   Socioeconomic History  . Marital status: Married    Spouse name: Not on file  . Number of children: Not on file  . Years of  education: Not on file  . Highest education level: Not on file  Occupational History  . Not on file  Social Needs  . Financial resource strain: Not on file  . Food insecurity    Worry: Not on file    Inability: Not on file  . Transportation needs    Medical: Not on file    Non-medical: Not on file  Tobacco Use  . Smoking status: Former Research scientist (life sciences)  . Smokeless tobacco: Never Used  Substance and Sexual Activity  . Alcohol use: No  . Drug use: No  . Sexual activity: Not on file  Lifestyle  . Physical activity    Days per week: Not on file    Minutes per session: Not on file  . Stress: Not  on file  Relationships  . Social Herbalist on phone: Not on file    Gets together: Not on file    Attends religious service: Not on file    Active member of club or organization: Not on file    Attends meetings of clubs or organizations: Not on file    Relationship status: Not on file  . Intimate partner violence    Fear of current or ex partner: Not on file    Emotionally abused: Not on file    Physically abused: Not on file    Forced sexual activity: Not on file  Other Topics Concern  . Not on file  Social History Narrative   Originally from [place]    Review of Systems  Constitutional: Negative for fatigue and unexpected weight change.  Eyes: Negative for visual disturbance.  Respiratory: Negative for cough, chest tightness and shortness of breath.   Cardiovascular: Negative for chest pain, palpitations and leg swelling.  Gastrointestinal: Negative for abdominal pain and blood in stool.  Neurological: Negative for dizziness, light-headedness and headaches.     Objective:   Vitals:   01/03/19 0951  BP: 126/68  Pulse: 65  Temp: 98.2 F (36.8 C)  TempSrc: Oral  SpO2: 98%  Weight: 127 lb 6.4 oz (57.8 kg)     Physical Exam Vitals signs reviewed.  Constitutional:      Appearance: He is well-developed.  HENT:     Head: Normocephalic and atraumatic.  Eyes:     Pupils: Pupils are equal, round, and reactive to light.  Neck:     Vascular: No carotid bruit or JVD.  Cardiovascular:     Rate and Rhythm: Normal rate and regular rhythm.     Heart sounds: Normal heart sounds. No murmur.  Pulmonary:     Effort: Pulmonary effort is normal.     Breath sounds: Normal breath sounds. No rales.  Skin:    General: Skin is warm and dry.  Neurological:     Mental Status: He is alert and oriented to person, place, and time.     Assessment & Plan:  Reginald Long is a 78 y.o. male . Type 2 diabetes mellitus with hyperglycemia, without long-term current use of insulin  ([facility]) - Plan: POCT glucose (manual entry)  - improved - continue metformin, recheck A1c in 3 months.   Essential hypertension  -  Stable, tolerating current regimen.   No orders of the defined types were placed in this encounter.  Patient Instructions   Keep a record of your blood sugars to next visit. If below 100, then can stop metfomin. Follow up in 3 months.   If you have lab work done today you will  be contacted with your lab results within the next 2 weeks.  If you have not heard from [place] then please contact us. The fastest way to get your results is to register for My Chart.   IF you received an x-ray today, you will receive an invoice from [facility]. Please contact [facility] Radiology at [phone] with questions or concerns regarding your invoice.   IF you received labwork today, you will receive an invoice from [place]. Please contact [facility] at [phone] with questions or concerns regarding your invoice.   Our billing staff will not be able to assist you with questions regarding bills from these companies.  You will be contacted with the lab results as soon as they are available. The fastest way to get your results is to activate your My Chart account. Instructions are located on the last page of this paperwork. If you have not heard from [place] regarding the results in 2 weeks, please contact this office.          Signed, Merri Ray, MD [facility]

## 2019-01-03 NOTE — Addendum Note (Signed)
Addended by: Merri Ray R on: 01/03/2019 10:48 AM   Modules accepted: Orders

## 2019-01-04 DIAGNOSIS — R69 Illness, unspecified: Secondary | ICD-10-CM | POA: Diagnosis not present

## 2019-01-31 ENCOUNTER — Other Ambulatory Visit: Payer: Self-pay | Admitting: Family Medicine

## 2019-02-07 DIAGNOSIS — R69 Illness, unspecified: Secondary | ICD-10-CM | POA: Diagnosis not present

## 2019-02-08 ENCOUNTER — Other Ambulatory Visit: Payer: Self-pay | Admitting: Family Medicine

## 2019-02-08 DIAGNOSIS — Z8739 Personal history of other diseases of the musculoskeletal system and connective tissue: Secondary | ICD-10-CM

## 2019-03-03 DIAGNOSIS — Z20828 Contact with and (suspected) exposure to other viral communicable diseases: Secondary | ICD-10-CM | POA: Diagnosis not present

## 2019-04-11 ENCOUNTER — Other Ambulatory Visit: Payer: Self-pay | Admitting: Family Medicine

## 2019-04-11 DIAGNOSIS — Z8739 Personal history of other diseases of the musculoskeletal system and connective tissue: Secondary | ICD-10-CM

## 2019-05-05 ENCOUNTER — Other Ambulatory Visit: Payer: Self-pay | Admitting: Family Medicine

## 2019-05-05 DIAGNOSIS — E785 Hyperlipidemia, unspecified: Secondary | ICD-10-CM

## 2019-05-05 DIAGNOSIS — R69 Illness, unspecified: Secondary | ICD-10-CM | POA: Diagnosis not present

## 2019-05-05 NOTE — Telephone Encounter (Signed)
Requested Prescriptions  Pending Prescriptions Disp Refills  . atorvastatin (LIPITOR) 10 MG tablet [Pharmacy Med Name: ATORVASTATIN 10MG  TABLETS] 90 tablet 0    Sig: TAKE 1 TABLET(10 MG) BY MOUTH DAILY AT 6 PM     Cardiovascular:  Antilipid - Statins Failed - 05/05/2019  7:06 AM      Failed - Total Cholesterol in normal range and within 360 days    Cholesterol, Total  Date Value Ref Range Status  10/13/2018 323 (H) 100 - 199 mg/dL Final         Failed - LDL in normal range and within 360 days    LDL Chol Calc (NIH)  Date Value Ref Range Status  10/13/2018 204 (H) 0 - 99 mg/dL Final         Failed - Triglycerides in normal range and within 360 days    Triglycerides  Date Value Ref Range Status  10/13/2018 285 (H) 0 - 149 mg/dL Final         Passed - HDL in normal range and within 360 days    HDL  Date Value Ref Range Status  10/13/2018 62 >39 mg/dL Final         Passed - Patient is not pregnant      Passed - Valid encounter within last 12 months    Recent Outpatient Visits          4 months ago Type 2 diabetes mellitus with hyperglycemia, without long-term current use of insulin [facility])   [facility], Ranell Patrick, MD   4 months ago Gout of multiple sites, unspecified cause, unspecified chronicity   [facility], Ranell Patrick, MD   6 months ago Osteoarthritis of left knee, unspecified osteoarthritis type   [facility], Ranell Patrick, MD   6 months ago Essential hypertension   [facility], Ranell Patrick, MD   8 months ago Osteoarthritis of left knee, unspecified osteoarthritis type   [facility], Delfino Lovett, NP

## 2019-05-11 ENCOUNTER — Other Ambulatory Visit: Payer: Self-pay | Admitting: Family Medicine

## 2019-05-11 DIAGNOSIS — I10 Essential (primary) hypertension: Secondary | ICD-10-CM

## 2019-05-27 ENCOUNTER — Other Ambulatory Visit: Payer: Self-pay

## 2019-05-27 ENCOUNTER — Ambulatory Visit (INDEPENDENT_AMBULATORY_CARE_PROVIDER_SITE_OTHER): Payer: Medicare HMO | Admitting: Family Medicine

## 2019-05-27 ENCOUNTER — Encounter: Payer: Self-pay | Admitting: Family Medicine

## 2019-05-27 VITALS — BP 134/64 | HR 54 | Temp 98.0°F | Ht 62.0 in | Wt 130.0 lb

## 2019-05-27 DIAGNOSIS — M109 Gout, unspecified: Secondary | ICD-10-CM

## 2019-05-27 DIAGNOSIS — I10 Essential (primary) hypertension: Secondary | ICD-10-CM | POA: Diagnosis not present

## 2019-05-27 DIAGNOSIS — E785 Hyperlipidemia, unspecified: Secondary | ICD-10-CM | POA: Diagnosis not present

## 2019-05-27 DIAGNOSIS — E1165 Type 2 diabetes mellitus with hyperglycemia: Secondary | ICD-10-CM

## 2019-05-27 DIAGNOSIS — Z8739 Personal history of other diseases of the musculoskeletal system and connective tissue: Secondary | ICD-10-CM

## 2019-05-27 DIAGNOSIS — R21 Rash and other nonspecific skin eruption: Secondary | ICD-10-CM

## 2019-05-27 MED ORDER — AMLODIPINE BESYLATE 5 MG PO TABS: 5.0000 mg | ORAL_TABLET | Freq: Every day | ORAL | 1 refills | Status: DC

## 2019-05-27 MED ORDER — METFORMIN HCL 500 MG PO TABS: 500.0000 mg | ORAL_TABLET | Freq: Two times a day (BID) | ORAL | 1 refills | Status: DC

## 2019-05-27 MED ORDER — ALLOPURINOL 100 MG PO TABS: 100.0000 mg | ORAL_TABLET | Freq: Every day | ORAL | 1 refills | Status: DC

## 2019-05-27 MED ORDER — ATORVASTATIN CALCIUM 10 MG PO TABS: ORAL_TABLET | ORAL | 1 refills | Status: DC

## 2019-05-27 MED ORDER — PREDNISONE 20 MG PO TABS: 40.0000 mg | ORAL_TABLET | Freq: Every day | ORAL | 0 refills | Status: DC

## 2019-05-27 MED ORDER — TRIAMCINOLONE ACETONIDE 0.1 % EX CREA: 1.0000 "application " | TOPICAL_CREAM | Freq: Three times a day (TID) | CUTANEOUS | 0 refills | Status: DC | PRN

## 2019-05-27 MED ORDER — COLCHICINE 0.6 MG PO TABS: ORAL_TABLET | ORAL | 2 refills | Status: AC

## 2019-05-27 MED ORDER — LISINOPRIL 20 MG PO TABS: 20.0000 mg | ORAL_TABLET | Freq: Every day | ORAL | 1 refills | Status: DC

## 2019-05-27 NOTE — Patient Instructions (Addendum)
Take allopurinol once per day to help PREVENT gout flare. Still ok to use colchicine if needed.  I will check some labs.   Rash could be from contact with plants or working outside, even potentially poison ivy.  Start prednisone for 5 days, but if it is poison ivy that may need to be extended for longer course. If symptoms not improving after 5 days or they return, return for recheck.  Okay to apply triamcinolone steroid cream 3 times daily as needed to itchy areas, keep areas clean with soap and water, cover any open areas and watch for any redness or pus from those areas.  Return if that occurs.  No change in other medications for now.I will check some labs.    Contact Dermatitis Dermatitis is redness, soreness, and swelling (inflammation) of the skin. Contact dermatitis is a reaction to certain substances that touch the skin. Many different substances can cause contact dermatitis. There are two types of contact dermatitis:  Irritant contact dermatitis. This type is caused by something that irritates your skin, such as having dry hands from washing them too often with soap. This type does not require previous exposure to the substance for a reaction to occur. This is the most common type.  Allergic contact dermatitis. This type is caused by a substance that you are allergic to, such as poison ivy. This type occurs when you have been exposed to the substance (allergen) and develop a sensitivity to it. Dermatitis may develop soon after your first exposure to the allergen, or it may not develop until the next time you are exposed and every time thereafter. What are the causes? Irritant contact dermatitis is most commonly caused by exposure to:  Makeup.  Soaps.  Detergents.  Bleaches.  Acids.  Metal salts, such as nickel. Allergic contact dermatitis is most commonly caused by exposure to:  Poisonous plants.  Chemicals.  Jewelry.  Latex.  Medicines.  Preservatives in products,  such as clothing. What increases the risk? You are more likely to develop this condition if you have:  A job that exposes you to irritants or allergens.  Certain medical conditions, such as asthma or eczema. What are the signs or symptoms? Symptoms of this condition may occur on your body anywhere the irritant has touched you or is touched by you.  Symptoms include: ? Dryness or flaking. ? Redness. ? Cracks. ? Itching. ? Pain or a burning feeling. ? Blisters. ? Drainage of small amounts of blood or clear fluid from skin cracks. With allergic contact dermatitis, there may also be swelling in areas such as the eyelids, mouth, or genitals. How is this diagnosed? This condition is diagnosed with a medical history and physical exam.  A patch skin test may be performed to help determine the cause.  If the condition is related to your job, you may need to see an occupational medicine specialist. How is this treated? This condition is treated by checking for the cause of the reaction and protecting your skin from further contact. Treatment may also include:  Steroid creams or ointments. Oral steroid medicines may be needed in more severe cases.  Antibiotic medicines or antibacterial ointments, if a skin infection is present.  Antihistamine lotion or an antihistamine taken by mouth to ease itching.  A bandage (dressing). Follow these instructions at home: Skin care  Moisturize your skin as needed.  Apply cool compresses to the affected areas.  Try applying baking soda paste to your skin. Stir water into baking  soda until it reaches a paste-like consistency.  Do not scratch your skin, and avoid friction to the affected area.  Avoid the use of soaps, perfumes, and dyes. Medicines  Take or apply over-the-counter and prescription medicines only as told by your health care provider.  If you were prescribed an antibiotic medicine, take or apply the antibiotic as told by your  health care provider. Do not stop using the antibiotic even if your condition improves. Bathing  Try taking a bath with: ? Epsom salts. Follow the instructions on the packaging. You can get these at your local pharmacy or grocery store. ? Baking soda. Pour a small amount into the bath as directed by your health care provider. ? Colloidal oatmeal. Follow the instructions on the packaging. You can get this at your local pharmacy or grocery store.  Bathe less frequently, such as every other day.  Bathe in lukewarm water. Avoid using hot water. Bandage care  If you were given a bandage (dressing), change it as told by your health care provider.  Wash your hands with soap and water before and after you change your dressing. If soap and water are not available, use hand sanitizer. General instructions  Avoid the substance that caused your reaction. If you do not know what caused it, keep a journal to try to track what caused it. Write down: ? What you eat. ? What cosmetic products you use. ? What you drink. ? What you wear in the affected area. This includes jewelry.  Check the affected areas every day for signs of infection. Check for: ? More redness, swelling, or pain. ? More fluid or blood. ? Warmth. ? Pus or a bad smell.  Keep all follow-up visits as told by your health care provider. This is important. Contact a health care provider if:  Your condition does not improve with treatment.  Your condition gets worse.  You have signs of infection such as swelling, tenderness, redness, soreness, or warmth in the affected area.  You have a fever.  You have new symptoms. Get help right away if:  You have a severe headache, neck pain, or neck stiffness.  You vomit.  You feel very sleepy.  You notice red streaks coming from the affected area.  Your bone or joint underneath the affected area becomes painful after the skin has healed.  The affected area turns darker.  You  have difficulty breathing. Summary  Dermatitis is redness, soreness, and swelling (inflammation) of the skin. Contact dermatitis is a reaction to certain substances that touch the skin.  Symptoms of this condition may occur on your body anywhere the irritant has touched you or is touched by you.  This condition is treated by figuring out what caused the reaction and protecting your skin from further contact. Treatment may also include medicines and skin care.  Avoid the substance that caused your reaction. If you do not know what caused it, keep a journal to try to track what caused it.  Contact a health care provider if your condition gets worse or you have signs of infection such as swelling, tenderness, redness, soreness, or warmth in the affected area. This information is not intended to replace advice given to you by your health care provider. Make sure you discuss any questions you have with your health care provider. Document Revised: 05/12/2018 Document Reviewed: 08/05/2017 Elsevier Patient Education  [place].  Rash, Adult A rash is a change in the color of your skin. A rash  can also change the way your skin feels. There are many different conditions and factors that can cause a rash. Some rashes may disappear after a few days, but some may last for a few weeks. Common causes of rashes include:  Viral infections, such as: ? Colds. ? Measles. ? Hand, foot, and mouth disease.  Bacterial infections, such as: ? Scarlet fever. ? Impetigo.  Fungal infections, such as Candida.  Allergic reactions to food, medicines, or skin care products. Follow these instructions at home: The goal of treatment is to stop the itching and keep the rash from spreading. Pay attention to any changes in your symptoms. Follow these instructions to help with your condition: Medicine Take or apply over-the-counter and prescription medicines only as told by your health care provider. These may  include:  Corticosteroid creams to treat red or swollen skin.  Anti-itch lotions.  Oral allergy medicines (antihistamines).  Oral corticosteroids for severe symptoms.  Skin care  Apply cool compresses to the affected areas.  Do not scratch or rub your skin.  Avoid covering the rash. Make sure the rash is exposed to air as much as possible. Managing itching and discomfort  Avoid hot showers or baths, which can make itching worse. A cold shower may help.  Try taking a bath with: ? Epsom salts. Follow manufacturer instructions on the packaging. You can get these at your local pharmacy or grocery store. ? Baking soda. Pour a small amount into the bath as told by your health care provider. ? Colloidal oatmeal. Follow manufacturer instructions on the packaging. You can get this at your local pharmacy or grocery store.  Try applying baking soda paste to your skin. Stir water into baking soda until it reaches a paste-like consistency.  Try applying calamine lotion. This is an over-the-counter lotion that helps to relieve itchiness.  Keep cool and out of the sun. Sweating and being hot can make itching worse. General instructions   Rest as needed.  Drink enough fluid to keep your urine pale yellow.  Wear loose-fitting clothing.  Avoid scented soaps, detergents, and perfumes. Use gentle soaps, detergents, perfumes, and other cosmetic products.  Avoid any substance that causes your rash. Keep a journal to help track what causes your rash. Write down: ? What you eat. ? What cosmetic products you use. ? What you drink. ? What you wear. This includes jewelry.  Keep all follow-up visits as told by your health care provider. This is important. Contact a health care provider if:  You sweat at night.  You lose weight.  You urinate more than normal.  You urinate less than normal, or you notice that your urine is a darker color than usual.  You feel weak.  You vomit.  Your  skin or the whites of your eyes look yellow (jaundice).  Your skin: ? Tingles. ? Is numb.  Your rash: ? Does not go away after several days. ? Gets worse.  You are: ? Unusually thirsty. ? More tired than normal.  You have: ? New symptoms. ? Pain in your abdomen. ? A fever. ? Diarrhea. Get help right away if you:  Have a fever and your symptoms suddenly get worse.  Develop confusion.  Have a severe headache or a stiff neck.  Have severe joint pains or stiffness.  Have a seizure.  Develop a rash that covers all or most of your body. The rash may or may not be painful.  Develop blisters that: ? Are on top of  the rash. ? Grow larger or grow together. ? Are painful. ? Are inside your nose or mouth.  Develop a rash that: ? Looks like purple pinprick-sized spots all over your body. ? Has a "bull's eye" or looks like a target. ? Is not related to sun exposure, is red and painful, and causes your skin to peel. Summary  A rash is a change in the color of your skin. Some rashes disappear after a few days, but some may last for a few weeks.  The goal of treatment is to stop the itching and keep the rash from spreading.  Take or apply over-the-counter and prescription medicines only as told by your health care provider.  Contact a health care provider if you have new or worsening symptoms.  Keep all follow-up visits as told by your health care provider. This is important. This information is not intended to replace advice given to you by your health care provider. Make sure you discuss any questions you have with your health care provider. Document Revised: 05/14/2018 Document Reviewed: 08/24/2017 Elsevier Patient Education  [employer].    If you have lab work done today you will be contacted with your lab results within the next 2 weeks.  If you have not heard from [place] then please contact us. The fastest way to get your results is to register for My  Chart.   IF you received an x-ray today, you will receive an invoice from [facility]. Please contact [facility] at [phone] with questions or concerns regarding your invoice.   IF you received labwork today, you will receive an invoice from [place]. Please contact [facility] at [phone] with questions or concerns regarding your invoice.   Our billing staff will not be able to assist you with questions regarding bills from these companies.  You will be contacted with the lab results as soon as they are available. The fastest way to get your results is to activate your My Chart account. Instructions are located on the last page of this paperwork. If you have not heard from [place] regarding the results in 2 weeks, please contact this office.

## 2019-05-27 NOTE — Progress Notes (Signed)
Subjective:  Patient ID: Reginald Long, male    DOB: 1940-03-20  Age: 79 y.o. MRN: [id number]  Offered video translator - declined, asked that his grandson translate.  CC:  Chief Complaint  Patient presents with  . Rash    started 2 weeks ago. Pt reports having som itchieness on both of pt's legs. pt has been scratching at it looks at though he has riped the skin in some places durring his scratching. pt also has a similar looking rash on center of his abdominal area.pt has tried some oil type of medication the pt can't recall the name, but it helps a little.    HPI Reginald Long presents for   Rash, skin itching. Abdomen and both lower legs, past 2 weeks. Does not sleep with spouse, sleeps on sofa, no sick contacts or others with similar sx's. No pets.  Does outside gardening, no known arms/hands lesions. No poison ivy seen wears long pants.  No new soaps, detergent, body care products.  No oral/genital rash, no stridor/wheeze/dyspnea.  Tx: otc oil - vietnamese oil with menthol and eucalyptus.  Temporary relief.   Gout: Last flare: about every 2 weeks, improves with colchicine Daily meds: allopurinol - not taking recently.  Prn med: colchicine.  Lab Results  Component Value Date   LABURIC 9.8 (H) 05/27/2019   Hypertension: norvasc 82m, lisinopril 275mqd. No new side effects.  Home readings: BP Readings from Last 3 Encounters:  05/27/19 134/64  01/03/19 126/68  12/16/18 (!) 150/71   Lab Results  Component Value Date   CREATININE 1.48 (H) 05/27/2019     . Hyperlipidemia: Not fasting.at 2.5 hrs ago. Taking lipitor daily, no new side effects.  Lab Results  Component Value Date   CHOL 256 (H) 05/27/2019   HDL 43 05/27/2019   LDLCALC 135 (H) 05/27/2019   TRIG 428 (H) 05/27/2019   CHOLHDL 6.0 (H) 05/27/2019   Lab Results  Component Value Date   ALT 37 05/27/2019   AST 29 05/27/2019   ALKPHOS 87 05/27/2019   BILITOT <0.2 05/27/2019    Diabetes: With hyperglycemia. Last  Aic improved after stopping steroid (used gout).  On metformin 50042mID, ace-I and statin.  Microalbumin: due.  Optho, foot exam, pneumovax:  Immunization History  Administered Date(s) Administered  . Influenza,inj,Quad PF,6+ Mos 12/04/2015  . PFIZER SARS-COV-2 Vaccination 04/05/2019, 05/09/2019  . Pneumococcal Conjugate-13 01/20/2018  due for pneumovax. Otherwise diabetic HM up to date.   Lab Results  Component Value Date   HGBA1C 6.1 (H) 05/27/2019   Lab Results  Component Value Date   ALT 37 05/27/2019   AST 29 05/27/2019   ALKPHOS 87 05/27/2019   BILITOT <0.2 05/27/2019     History Patient Active Problem List   Diagnosis Date Noted  . Acute appendicitis 12/08/2016  . Non-English speaking patient ([facility]1/06/2016  . Right renal mass 12/08/2016  . CKD (chronic kidney disease), stage III 12/08/2016  . PAF (paroxysmal atrial fibrillation) ([facility]/06/2016  . Acute gangrenous appendicitis with perforation and peritonitis 12/08/2016  . Viral enteritis 12/04/2015  . Hyperlipidemia 12/04/2015  . Essential hypertension 12/04/2015  . Right foot pain 12/04/2015  . Acute gout of right ankle 12/04/2015  . Melanoma of skin, site unspecified 10/26/2013   Past Medical History:  Diagnosis Date  . Gout   . Hyperlipidemia   . Hypertension   . [place] ([place]oxysmal atrial fibrillation) ([facility]   Past Surgical History:  Procedure Laterality Date  . LAPAROSCOPIC APPENDECTOMY N/A  12/08/2016   Procedure: APPENDECTOMY LAPAROSCOPIC;  Surgeon: [name], MD;  Location: [facility];  Service: General;  Laterality: N/A;  . right thumb surgery     No Known Allergies Prior to Admission medications   Medication Sig Start Date End Date Taking? Authorizing Provider  amLODipine (NORVASC) 5 MG tablet Take 1 tablet (5 mg total) by mouth daily. 12/16/18  Yes Wendie Agreste, MD  atorvastatin (LIPITOR) 10 MG tablet TAKE 1 TABLET(10 MG) BY MOUTH DAILY AT 6 PM 05/05/19  Yes Wendie Agreste, MD  colchicine 0.6 MG tablet TAKE 2 TABLET AT ONSET OF FLARE UP THEN 1 TABLET ONE HOUR LATER 04/11/19  Yes Wendie Agreste, MD  lisinopril (ZESTRIL) 20 MG tablet TAKE 1 TABLET(20 MG) BY MOUTH DAILY 05/11/19  Yes Wendie Agreste, MD  metFORMIN (GLUCOPHAGE) 500 MG tablet Take 1 tablet (500 mg total) by mouth 2 (two) times daily with a meal. 12/16/18  Yes Wendie Agreste, MD  [facility] ULTRA test strip USE AS DIRECTED DAILY 01/31/19  Yes Wendie Agreste, MD  allopurinol (ZYLOPRIM) 100 MG tablet Take 1 tablet (100 mg total) by mouth daily. 11/04/18   Wendie Agreste, MD  blood glucose meter kit and supplies Dispense based on patient and insurance preference.  Once per day - fasting or 2 hours after meal. Patient not taking: Reported on 05/27/2019 01/03/19   Wendie Agreste, MD   Social History   Socioeconomic History  . Marital status: Married    Spouse name: Not on file  . Number of children: Not on file  . Years of education: Not on file  . Highest education level: Not on file  Occupational History  . Not on file  Tobacco Use  . Smoking status: Former Research scientist (life sciences)  . Smokeless tobacco: Never Used  Substance and Sexual Activity  . Alcohol use: No  . Drug use: No  . Sexual activity: Not on file  Other Topics Concern  . Not on file  Social History Narrative   Originally from [place]   Social Determinants of Health   Financial Resource Strain:   . Difficulty of Paying Living Expenses:   Food Insecurity:   . Worried About Charity fundraiser in the Last Year:   . Arboriculturist in the Last Year:   Transportation Needs:   . Film/video editor (Medical):   Marland Kitchen Lack of Transportation (Non-Medical):   Physical Activity:   . Days of Exercise per Week:   . Minutes of Exercise per Session:   Stress:   . Feeling of Stress :   Social Connections:   . Frequency of Communication with Friends and Family:   . Frequency of Social Gatherings with Friends and Family:   .  Attends Religious Services:   . Active Member of Clubs or Organizations:   . Attends Archivist Meetings:   Marland Kitchen Marital Status:   Intimate Partner Violence:   . Fear of Current or Ex-Partner:   . Emotionally Abused:   Marland Kitchen Physically Abused:   . Sexually Abused:     Review of Systems  Constitutional: Negative for fatigue and unexpected weight change.  Eyes: Negative for visual disturbance.  Respiratory: Negative for cough, chest tightness and shortness of breath.   Cardiovascular: Negative for chest pain, palpitations and leg swelling.  Gastrointestinal: Negative for abdominal pain and blood in stool.  Skin: Positive for rash.  Neurological: Negative for dizziness, light-headedness and headaches.  Objective:   Vitals:   05/27/19 1442  BP: 134/64  Pulse: (!) 54  Temp: 98 F (36.7 C)  TempSrc: Temporal  SpO2: 99%  Weight: 130 lb (59 kg)  Height: _0  (1.575 m)     Physical Exam Vitals reviewed.  Constitutional:      Appearance: He is well-developed.  HENT:     Head: Normocephalic and atraumatic.  Eyes:     Pupils: Pupils are equal, round, and reactive to light.  Neck:     Vascular: No carotid bruit or JVD.  Cardiovascular:     Rate and Rhythm: Normal rate and regular rhythm.     Heart sounds: Normal heart sounds. No murmur.  Pulmonary:     Effort: Pulmonary effort is normal.     Breath sounds: Normal breath sounds. No rales.  Skin:    General: Skin is warm and dry.     Comments: Excoriated rash lower legs, primarily anterior, as well as mid abdomen.  Sparing of ankles, feet, arms, interdigital areas of hands.  No other rash appreciated.  See photos  Neurological:     Mental Status: He is alert and oriented to person, place, and time.          Assessment & Plan:  Reginald Long is a 79 y.o. male . Rash and nonspecific skin eruption - Plan: predniSONE (DELTASONE) 20 MG tablet, triamcinolone cream (KENALOG) 0.1 %  -Suspected contact dermatitis from  plants.  Sparing of hands, arms, less likely scabies.  Initial trial with short-term prednisone given history of hyperglycemia with diabetes, close monitoring of home readings, topical triamcinolone with RTC precautions.  Type 2 diabetes mellitus with hyperglycemia, without long-term current use of insulin (HCC) - Plan: Microalbumin / creatinine urine ratio, Hemoglobin A1c, metFORMIN (GLUCOPHAGE) 500 MG tablet  -Check labs, continue Metformin same dose for now.  Essential hypertension - Plan: Comprehensive metabolic panel, lisinopril (ZESTRIL) 20 MG tablet, amLODipine (NORVASC) 5 MG tablet  - Stable, tolerating current regimen. Medications refilled. Labs pending as above.   Hyperlipidemia, unspecified hyperlipidemia type - Plan: Lipid panel, atorvastatin (LIPITOR) 10 MG tablet  -  Stable, tolerating current regimen. Medications refilled. Labs pending as above.   Gout of multiple sites, unspecified cause, unspecified chronicity - Plan: Uric Acid, allopurinol (ZYLOPRIM) 100 MG tablet History of gout - Plan: colchicine 0.6 MG tablet  -Mild flairs, still frequent.  Recommend restart allopurinol daily, check uric acid, colchicine if needed with RTC precautions given.   Meds ordered this encounter  Medications  . predniSONE (DELTASONE) 20 MG tablet    Sig: Take 2 tablets (40 mg total) by mouth daily with breakfast.    Dispense:  10 tablet    Refill:  0  . triamcinolone cream (KENALOG) 0.1 %    Sig: Apply 1 application topically 3 (three) times daily as needed.    Dispense:  30 g    Refill:  0  . atorvastatin (LIPITOR) 10 MG tablet    Sig: TAKE 1 TABLET(10 MG) BY MOUTH DAILY AT 6 PM    Dispense:  90 tablet    Refill:  1  . colchicine 0.6 MG tablet    Sig: 2 at onset of gout, 1 tab in 1 hour if needed.    Dispense:  15 tablet    Refill:  2  . lisinopril (ZESTRIL) 20 MG tablet    Sig: Take 1 tablet (20 mg total) by mouth daily.    Dispense:  90 tablet    Refill:  1  . allopurinol  (ZYLOPRIM) 100 MG tablet    Sig: Take 1 tablet (100 mg total) by mouth daily.    Dispense:  90 tablet    Refill:  1  . amLODipine (NORVASC) 5 MG tablet    Sig: Take 1 tablet (5 mg total) by mouth daily.    Dispense:  90 tablet    Refill:  1  . metFORMIN (GLUCOPHAGE) 500 MG tablet    Sig: Take 1 tablet (500 mg total) by mouth 2 (two) times daily with a meal.    Dispense:  180 tablet    Refill:  1   Patient Instructions    Take allopurinol once per day to help PREVENT gout flare. Still ok to use colchicine if needed.  I will check some labs.   Rash could be from contact with plants or working outside, even potentially poison ivy.  Start prednisone for 5 days, but if it is poison ivy that may need to be extended for longer course. If symptoms not improving after 5 days or they return, return for recheck.  Okay to apply triamcinolone steroid cream 3 times daily as needed to itchy areas, keep areas clean with soap and water, cover any open areas and watch for any redness or pus from those areas.  Return if that occurs.  No change in other medications for now.I will check some labs.    Contact Dermatitis Dermatitis is redness, soreness, and swelling (inflammation) of the skin. Contact dermatitis is a reaction to certain substances that touch the skin. Many different substances can cause contact dermatitis. There are two types of contact dermatitis:  Irritant contact dermatitis. This type is caused by something that irritates your skin, such as having dry hands from washing them too often with soap. This type does not require previous exposure to the substance for a reaction to occur. This is the most common type.  Allergic contact dermatitis. This type is caused by a substance that you are allergic to, such as poison ivy. This type occurs when you have been exposed to the substance (allergen) and develop a sensitivity to it. Dermatitis may develop soon after your first exposure to the  allergen, or it may not develop until the next time you are exposed and every time thereafter. What are the causes? Irritant contact dermatitis is most commonly caused by exposure to:  Makeup.  Soaps.  Detergents.  Bleaches.  Acids.  Metal salts, such as nickel. Allergic contact dermatitis is most commonly caused by exposure to:  Poisonous plants.  Chemicals.  Jewelry.  Latex.  Medicines.  Preservatives in products, such as clothing. What increases the risk? You are more likely to develop this condition if you have:  A job that exposes you to irritants or allergens.  Certain medical conditions, such as asthma or eczema. What are the signs or symptoms? Symptoms of this condition may occur on your body anywhere the irritant has touched you or is touched by you.  Symptoms include: ? Dryness or flaking. ? Redness. ? Cracks. ? Itching. ? Pain or a burning feeling. ? Blisters. ? Drainage of small amounts of blood or clear fluid from skin cracks. With allergic contact dermatitis, there may also be swelling in areas such as the eyelids, mouth, or genitals. How is this diagnosed? This condition is diagnosed with a medical history and physical exam.  A patch skin test may be performed to help determine the cause.  If the condition is related to your  job, you may need to see an occupational medicine specialist. How is this treated? This condition is treated by checking for the cause of the reaction and protecting your skin from further contact. Treatment may also include:  Steroid creams or ointments. Oral steroid medicines may be needed in more severe cases.  Antibiotic medicines or antibacterial ointments, if a skin infection is present.  Antihistamine lotion or an antihistamine taken by mouth to ease itching.  A bandage (dressing). Follow these instructions at home: Skin care  Moisturize your skin as needed.  Apply cool compresses to the affected  areas.  Try applying baking soda paste to your skin. Stir water into baking soda until it reaches a paste-like consistency.  Do not scratch your skin, and avoid friction to the affected area.  Avoid the use of soaps, perfumes, and dyes. Medicines  Take or apply over-the-counter and prescription medicines only as told by your health care provider.  If you were prescribed an antibiotic medicine, take or apply the antibiotic as told by your health care provider. Do not stop using the antibiotic even if your condition improves. Bathing  Try taking a bath with: ? Epsom salts. Follow the instructions on the packaging. You can get these at your local pharmacy or grocery store. ? Baking soda. Pour a small amount into the bath as directed by your health care provider. ? Colloidal oatmeal. Follow the instructions on the packaging. You can get this at your local pharmacy or grocery store.  Bathe less frequently, such as every other day.  Bathe in lukewarm water. Avoid using hot water. Bandage care  If you were given a bandage (dressing), change it as told by your health care provider.  Wash your hands with soap and water before and after you change your dressing. If soap and water are not available, use hand sanitizer. General instructions  Avoid the substance that caused your reaction. If you do not know what caused it, keep a journal to try to track what caused it. Write down: ? What you eat. ? What cosmetic products you use. ? What you drink. ? What you wear in the affected area. This includes jewelry.  Check the affected areas every day for signs of infection. Check for: ? More redness, swelling, or pain. ? More fluid or blood. ? Warmth. ? Pus or a bad smell.  Keep all follow-up visits as told by your health care provider. This is important. Contact a health care provider if:  Your condition does not improve with treatment.  Your condition gets worse.  You have signs of  infection such as swelling, tenderness, redness, soreness, or warmth in the affected area.  You have a fever.  You have new symptoms. Get help right away if:  You have a severe headache, neck pain, or neck stiffness.  You vomit.  You feel very sleepy.  You notice red streaks coming from the affected area.  Your bone or joint underneath the affected area becomes painful after the skin has healed.  The affected area turns darker.  You have difficulty breathing. Summary  Dermatitis is redness, soreness, and swelling (inflammation) of the skin. Contact dermatitis is a reaction to certain substances that touch the skin.  Symptoms of this condition may occur on your body anywhere the irritant has touched you or is touched by you.  This condition is treated by figuring out what caused the reaction and protecting your skin from further contact. Treatment may also include medicines and skin  care.  Avoid the substance that caused your reaction. If you do not know what caused it, keep a journal to try to track what caused it.  Contact a health care provider if your condition gets worse or you have signs of infection such as swelling, tenderness, redness, soreness, or warmth in the affected area. This information is not intended to replace advice given to you by your health care provider. Make sure you discuss any questions you have with your health care provider. Document Revised: 05/12/2018 Document Reviewed: 08/05/2017 Elsevier Patient Education  [place].  Rash, Adult A rash is a change in the color of your skin. A rash can also change the way your skin feels. There are many different conditions and factors that can cause a rash. Some rashes may disappear after a few days, but some may last for a few weeks. Common causes of rashes include:  Viral infections, such as: ? Colds. ? Measles. ? Hand, foot, and mouth disease.  Bacterial infections, such as: ? Scarlet  fever. ? Impetigo.  Fungal infections, such as Candida.  Allergic reactions to food, medicines, or skin care products. Follow these instructions at home: The goal of treatment is to stop the itching and keep the rash from spreading. Pay attention to any changes in your symptoms. Follow these instructions to help with your condition: Medicine Take or apply over-the-counter and prescription medicines only as told by your health care provider. These may include:  Corticosteroid creams to treat red or swollen skin.  Anti-itch lotions.  Oral allergy medicines (antihistamines).  Oral corticosteroids for severe symptoms.  Skin care  Apply cool compresses to the affected areas.  Do not scratch or rub your skin.  Avoid covering the rash. Make sure the rash is exposed to air as much as possible. Managing itching and discomfort  Avoid hot showers or baths, which can make itching worse. A cold shower may help.  Try taking a bath with: ? Epsom salts. Follow manufacturer instructions on the packaging. You can get these at your local pharmacy or grocery store. ? Baking soda. Pour a small amount into the bath as told by your health care provider. ? Colloidal oatmeal. Follow manufacturer instructions on the packaging. You can get this at your local pharmacy or grocery store.  Try applying baking soda paste to your skin. Stir water into baking soda until it reaches a paste-like consistency.  Try applying calamine lotion. This is an over-the-counter lotion that helps to relieve itchiness.  Keep cool and out of the sun. Sweating and being hot can make itching worse. General instructions   Rest as needed.  Drink enough fluid to keep your urine pale yellow.  Wear loose-fitting clothing.  Avoid scented soaps, detergents, and perfumes. Use gentle soaps, detergents, perfumes, and other cosmetic products.  Avoid any substance that causes your rash. Keep a journal to help track what causes  your rash. Write down: ? What you eat. ? What cosmetic products you use. ? What you drink. ? What you wear. This includes jewelry.  Keep all follow-up visits as told by your health care provider. This is important. Contact a health care provider if:  You sweat at night.  You lose weight.  You urinate more than normal.  You urinate less than normal, or you notice that your urine is a darker color than usual.  You feel weak.  You vomit.  Your skin or the whites of your eyes look yellow (jaundice).  Your skin: ?  Tingles. ? Is numb.  Your rash: ? Does not go away after several days. ? Gets worse.  You are: ? Unusually thirsty. ? More tired than normal.  You have: ? New symptoms. ? Pain in your abdomen. ? A fever. ? Diarrhea. Get help right away if you:  Have a fever and your symptoms suddenly get worse.  Develop confusion.  Have a severe headache or a stiff neck.  Have severe joint pains or stiffness.  Have a seizure.  Develop a rash that covers all or most of your body. The rash may or may not be painful.  Develop blisters that: ? Are on top of the rash. ? Grow larger or grow together. ? Are painful. ? Are inside your nose or mouth.  Develop a rash that: ? Looks like purple pinprick-sized spots all over your body. ? Has a "bull's eye" or looks like a target. ? Is not related to sun exposure, is red and painful, and causes your skin to peel. Summary  A rash is a change in the color of your skin. Some rashes disappear after a few days, but some may last for a few weeks.  The goal of treatment is to stop the itching and keep the rash from spreading.  Take or apply over-the-counter and prescription medicines only as told by your health care provider.  Contact a health care provider if you have new or worsening symptoms.  Keep all follow-up visits as told by your health care provider. This is important. This information is not intended to replace  advice given to you by your health care provider. Make sure you discuss any questions you have with your health care provider. Document Revised: 05/14/2018 Document Reviewed: 08/24/2017 Elsevier Patient Education  [employer].    If you have lab work done today you will be contacted with your lab results within the next 2 weeks.  If you have not heard from [place] then please contact us. The fastest way to get your results is to register for My Chart.   IF you received an x-ray today, you will receive an invoice from [facility]. Please contact [facility] at [phone] with questions or concerns regarding your invoice.   IF you received labwork today, you will receive an invoice from [facility]. Please contact [facility] at [phone] with questions or concerns regarding your invoice.   Our billing staff will not be able to assist you with questions regarding bills from these companies.  You will be contacted with the lab results as soon as they are available. The fastest way to get your results is to activate your My Chart account. Instructions are located on the last page of this paperwork. If you have not heard from [place] regarding the results in 2 weeks, please contact this office.         Signed, Merri Ray, MD [facility]

## 2019-05-28 ENCOUNTER — Encounter: Payer: Self-pay | Admitting: Family Medicine

## 2019-05-28 LAB — COMPREHENSIVE METABOLIC PANEL
ALT: 37 IU/L (ref 0–44)
AST: 29 IU/L (ref 0–40)
Albumin/Globulin Ratio: 1.6 (ref 1.2–2.2)
Albumin: 4.5 g/dL (ref 3.7–4.7)
Alkaline Phosphatase: 87 IU/L (ref 39–117)
BUN/Creatinine Ratio: 17 (ref 10–24)
BUN: 25 mg/dL (ref 8–27)
Bilirubin Total: <0.2 mg/dL (ref 0.0–1.2)
CO2: 21 mmol/L (ref 20–29)
Calcium: 9.9 mg/dL (ref 8.6–10.2)
Chloride: 106 mmol/L (ref 96–106)
Creatinine, Ser: 1.48 mg/dL — ABNORMAL HIGH (ref 0.76–1.27)
GFR calc Af Amer: 52 mL/min/{1.73_m2} — ABNORMAL LOW (ref >59)
GFR calc non Af Amer: 45 mL/min/{1.73_m2} — ABNORMAL LOW (ref >59)
Globulin, Total: 2.8 g/dL (ref 1.5–4.5)
Glucose: 100 mg/dL — ABNORMAL HIGH (ref 65–99)
Potassium: 5.3 mmol/L — ABNORMAL HIGH (ref 3.5–5.2)
Sodium: 141 mmol/L (ref 134–144)
Total Protein: 7.3 g/dL (ref 6.0–8.5)

## 2019-05-28 LAB — LIPID PANEL
Chol/HDL Ratio: 6 ratio — ABNORMAL HIGH (ref 0.0–5.0)
Cholesterol, Total: 256 mg/dL — ABNORMAL HIGH (ref 100–199)
HDL: 43 mg/dL (ref >39)
LDL Chol Calc (NIH): 135 mg/dL — ABNORMAL HIGH (ref 0–99)
Triglycerides: 428 mg/dL — ABNORMAL HIGH (ref 0–149)
VLDL Cholesterol Cal: 78 mg/dL — ABNORMAL HIGH (ref 5–40)

## 2019-05-28 LAB — MICROALBUMIN / CREATININE URINE RATIO
Creatinine, Urine: 86.5 mg/dL
Microalb/Creat Ratio: 8 mg/g creat (ref 0–29)
Microalbumin, Urine: 7.1 ug/mL

## 2019-05-28 LAB — HEMOGLOBIN A1C
Est. average glucose Bld gHb Est-mCnc: 128 mg/dL
Hgb A1c MFr Bld: 6.1 % — ABNORMAL HIGH (ref 4.8–5.6)

## 2019-05-28 LAB — URIC ACID: Uric Acid: 9.8 mg/dL — ABNORMAL HIGH (ref 3.8–8.4)

## 2019-06-07 ENCOUNTER — Encounter: Payer: Self-pay | Admitting: Radiology

## 2019-08-15 DIAGNOSIS — H11043 Peripheral pterygium, stationary, bilateral: Secondary | ICD-10-CM | POA: Diagnosis not present

## 2019-08-15 DIAGNOSIS — H348112 Central retinal vein occlusion, right eye, stable: Secondary | ICD-10-CM | POA: Diagnosis not present

## 2019-08-15 DIAGNOSIS — H353121 Nonexudative age-related macular degeneration, left eye, early dry stage: Secondary | ICD-10-CM | POA: Diagnosis not present

## 2019-08-15 DIAGNOSIS — H25813 Combined forms of age-related cataract, bilateral: Secondary | ICD-10-CM | POA: Diagnosis not present

## 2019-08-15 DIAGNOSIS — E119 Type 2 diabetes mellitus without complications: Secondary | ICD-10-CM | POA: Diagnosis not present

## 2019-08-22 DIAGNOSIS — M1A0721 Idiopathic chronic gout, left ankle and foot, with tophus (tophi): Secondary | ICD-10-CM | POA: Diagnosis not present

## 2019-08-24 ENCOUNTER — Telehealth: Payer: Self-pay | Admitting: Family Medicine

## 2019-08-24 DIAGNOSIS — M109 Gout, unspecified: Secondary | ICD-10-CM

## 2019-08-24 NOTE — Telephone Encounter (Signed)
Copied from [place] [id number]. Topic: Referral - Request for Referral >> Aug 22, 2019  9:53 AM Rainey Pines A wrote: Has patient seen PCP for this complaint? {yes *If NO, is insurance requiring patient see PCP for this issue before PCP can refer them? Referral for which specialty: Rheumatology  Preferred provider/office: no preference Reason for referral: Gout   Son calling back today to follow up on request for Rheumatology referral .  Pt had one earlier in year and did not get to go. Would like asap

## 2019-08-24 NOTE — Telephone Encounter (Signed)
Last week his hands and feet are very swollen and gouty according to podiatry on his Monday visit. According to pt grandson.  I have sent the referral since a referral has been sent for this same issue previously.

## 2019-09-08 ENCOUNTER — Other Ambulatory Visit: Payer: Self-pay | Admitting: Family Medicine

## 2019-09-08 DIAGNOSIS — R21 Rash and other nonspecific skin eruption: Secondary | ICD-10-CM

## 2019-09-08 NOTE — Telephone Encounter (Signed)
Requested medication (s) are due for refill today: Yes  Requested medication (s) are on the active medication list: Yes  Last refill:  05/26/19  Future visit scheduled: Yes  Notes to clinic:  See request.    Requested Prescriptions  Pending Prescriptions Disp Refills   predniSONE (DELTASONE) 20 MG tablet [Pharmacy Med Name: PREDNISONE 20MG  TABLETS] 10 tablet 0    Sig: TAKE 2 TABLETS(40 MG) BY MOUTH DAILY WITH BREAKFAST      Not Delegated - Endocrinology:  Oral Corticosteroids Failed - 09/08/2019 10:59 AM      Failed - This refill cannot be delegated      Passed - Last BP in normal range    BP Readings from Last 1 Encounters:  05/27/19 134/64          Passed - Valid encounter within last 6 months    Recent Outpatient Visits           3 months ago Rash and nonspecific skin eruption   [facility], Ranell Patrick, MD   8 months ago Type 2 diabetes mellitus with hyperglycemia, without long-term current use of insulin [facility])   [facility], Ranell Patrick, MD   8 months ago Gout of multiple sites, unspecified cause, unspecified chronicity   [facility], Ranell Patrick, MD   10 months ago Osteoarthritis of left knee, unspecified osteoarthritis type   [facility], Ranell Patrick, MD   11 months ago Essential hypertension   [facility], Ranell Patrick, MD       Future Appointments             In 2 months Carlota Raspberry Ranell Patrick, MD [facility] [place], [facility]   In 5 months Bo Merino, MD [facility]

## 2019-09-12 ENCOUNTER — Other Ambulatory Visit: Payer: Self-pay

## 2019-09-12 ENCOUNTER — Telehealth: Payer: Medicare HMO | Admitting: Family Medicine

## 2019-09-14 ENCOUNTER — Other Ambulatory Visit: Payer: Self-pay

## 2019-09-14 ENCOUNTER — Telehealth (INDEPENDENT_AMBULATORY_CARE_PROVIDER_SITE_OTHER): Payer: Medicare HMO | Admitting: Family Medicine

## 2019-09-14 ENCOUNTER — Encounter: Payer: Self-pay | Admitting: Family Medicine

## 2019-09-14 DIAGNOSIS — R21 Rash and other nonspecific skin eruption: Secondary | ICD-10-CM | POA: Diagnosis not present

## 2019-09-14 MED ORDER — TRIAMCINOLONE ACETONIDE 0.1 % EX CREA: 1.0000 "application " | TOPICAL_CREAM | Freq: Two times a day (BID) | CUTANEOUS | 0 refills | Status: AC

## 2019-09-14 MED ORDER — PREDNISONE 20 MG PO TABS: 40.0000 mg | ORAL_TABLET | Freq: Every day | ORAL | 0 refills | Status: DC

## 2019-09-14 NOTE — Patient Instructions (Signed)
° ° ° °  If you have lab work done today you will be contacted with your lab results within the next 2 weeks.  If you have not heard from us then please contact us. The fastest way to get your results is to register for My Chart. ° ° °IF you received an x-ray today, you will receive an invoice from [facility]. Please contact [facility] at [phone] with questions or concerns regarding your invoice.  ° °IF you received labwork today, you will receive an invoice from [facility]. Please contact [facility] at [phone] with questions or concerns regarding your invoice.  ° °Our billing staff will not be able to assist you with questions regarding bills from these companies. ° °You will be contacted with the lab results as soon as they are available. The fastest way to get your results is to activate your My Chart account. Instructions are located on the last page of this paperwork. If you have not heard from us regarding the results in 2 weeks, please contact this office. °  ° ° ° °

## 2019-09-14 NOTE — Progress Notes (Signed)
Virtual Visit via Video Note  I connected with Reginald Long on 09/14/19 at 5:09 PM by a video enabled telemedicine application and verified that I am speaking with the correct person using two identifiers.  Patient location: grandson [employer]. He will translate - declines other translator. Consent obtained to discuss PHI with grandson present.  My location: office.    I discussed the limitations, risks, security and privacy concerns of performing an evaluation and management service by telephone and the availability of in person appointments. I also discussed with the patient that there may be a patient responsible charge related to this service. The patient expressed understanding and agreed to proceed, consent obtained  Chief complaint:  Chief Complaint  Patient presents with  . Medication Refill    for prednison due to rash. rash is located on  both legs and both hands in his palm. pt had this same rash last year and the prednisone help resoverl the rash last year when the pt had the same rash. rash is itchy.      History of Present Illness: Reginald Long is a 79 y.o. male  Rash: Both legs, palms of hands. past 2 weeks. Initially on legs, then hands after 1-2 days.  No recent gardening.  No other family members with rash.  No rash in genital area, or mouth, no mouth ulcers.  Min on back, not on abdomen.  No face/lip/tongue swelling.  No stridor/wheeze/dyspena/trouble swallowing.  No fever/headache.  No recent tick exposure or ticks removed.  Itchy rash.   Takes allopurinol daily since last visit in April.  No new meds, soap, detergent, fabric softener, or new creams or lotions.  Tx: none.   Feels similar to rash in April - resolved with prednisone at that time. Thought to be contact derm at that time - 5 days of 52m prednisone. Did not use cream. Not sure if triamcinolone cream filled.    Patient Active Problem List   Diagnosis Date Noted  . Acute appendicitis 12/08/2016  .  Non-English speaking patient (Verner Chol 12/08/2016  . Right renal mass 12/08/2016  . CKD (chronic kidney disease), stage III 12/08/2016  . PA[place] ([place]ox[place]mal atrial fibrillation) [place] 12/08/2016  . Acute gangrenous appendicitis with perforation and peritonitis 12/08/2016  . Viral enteritis 12/04/2015  . Hyperlipidemia 12/04/2015  . Essential hypertension 12/04/2015  . Right foot pain 12/04/2015  . Acute gout of right ankle 12/04/2015  . Melanoma of skin, site unspecified 10/26/2013   Past Medical History:  Diagnosis Date  . Gout   . Hyperlipidemia   . Hypertension   . PAF (par[place]ysmal atrial fibrillation) ([place] 12/08/2016   Past Surgical History:  Procedure Laterality Date  . LAPAROSCOPIC APPENDECTOMY N/A 12/08/2016   Procedure: APPENDECTOMY LAPAROSCOPIC;  Surgeon: GMichael Boston MD;  Location: [facility];  Service: General;  Laterality: N/A;  . right thumb surgery     No Known Allergies Prior to Admission medications   Medication Sig Start Date End Date Taking? Authorizing Provider  allopurinol (ZYLOPRIM) 100 MG tablet Take 1 tablet (100 mg total) by mouth daily. 05/27/19  Yes GWendie Agreste MD  amLODipine (NORVASC) 5 MG tablet Take 1 tablet (5 mg total) by mouth daily. 05/27/19  Yes GWendie Agreste MD  atorvastatin (LIPITOR) 10 MG tablet TAKE 1 TABLET(10 MG) BY MOUTH DAILY AT 6 PM 05/27/19  Yes GWendie Agreste MD  blood glucose meter kit and supplies Dispense based on patient and insurance preference.  Once per day - fasting or 2  hours after meal. 01/03/19  Yes Wendie Agreste, MD  colchicine 0.6 MG tablet 2 at onset of gout, 1 tab in 1 hour if needed. 05/27/19  Yes Wendie Agreste, MD  lisinopril (ZESTRIL) 20 MG tablet Take 1 tablet (20 mg total) by mouth daily. 05/27/19  Yes Wendie Agreste, MD  metFORMIN (GLUCOPHAGE) 500 MG tablet Take 1 tablet (500 mg total) by mouth 2 (two) times daily with a meal. 05/27/19  Yes Wendie Agreste, MD  [facility] ULTRA test strip USE AS  DIRECTED DAILY 01/31/19  Yes Wendie Agreste, MD  triamcinolone cream (KENALOG) 0.1 % Apply 1 application topically 3 (three) times daily as needed. 05/27/19  Yes Wendie Agreste, MD  predniSONE (DELTASONE) 20 MG tablet Take 2 tablets (40 mg total) by mouth daily with breakfast. Patient not taking: Reported on 09/14/2019 05/27/19   Wendie Agreste, MD   Social History   Socioeconomic History  . Marital status: Married    Spouse name: Not on file  . Number of children: Not on file  . Years of education: Not on file  . Highest education level: Not on file  Occupational History  . Not on file  Tobacco Use  . Smoking status: Former Research scientist (life sciences)  . Smokeless tobacco: Never Used  Substance and Sexual Activity  . Alcohol use: No  . Drug use: No  . Sexual activity: Not on file  Other Topics Concern  . Not on file  Social History Narrative   Originally from [place]   Social Determinants of Health   Financial Resource Strain:   . Difficulty of Paying Living Expenses:   Food Insecurity:   . Worried About Charity fundraiser in the Last Year:   . Arboriculturist in the Last Year:   Transportation Needs:   . Film/video editor (Medical):   Marland Kitchen Lack of Transportation (Non-Medical):   Physical Activity:   . Days of Exercise per Week:   . Minutes of Exercise per Session:   Stress:   . Feeling of Stress :   Social Connections:   . Frequency of Communication with Friends and Family:   . Frequency of Social Gatherings with Friends and Family:   . Attends Religious Services:   . Active Member of Clubs or Organizations:   . Attends Archivist Meetings:   Marland Kitchen Marital Status:   Intimate Partner Violence:   . Fear of Current or Ex-Partner:   . Emotionally Abused:   Marland Kitchen Physically Abused:   . Sexually Abused:     Observations/Objective: Vitals:   09/14/19 1648  Weight: 124 lb (56.2 kg)  Height: '5\' 2"'  (1.575 m)  no distress on video, no respiratory distress.            Assessment and Plan: Rash and nonspecific skin eruption - Plan: predniSONE (DELTASONE) 20 MG tablet  -Pruritic nature and few excoriated papules noted on video.  Still suspect contact dermatitis versus eczema.  Palmar hand rash possible dyshidrotic eczema.  Without other systemic symptoms and localized reactions to legs and hands, doubt allopurinol but that has to be kept in differential as well.  Denies tick bites, denies other systemic symptoms.  Similar rash earlier this year responded quickly to prednisone.  -Repeat prednisone 40 mg daily x5 days, triamcinolone topical if needed, Aveeno lotion over-the-counter as needed.  If not improving into this weekend or any worse symptoms, urgent care/RTC precautions discussed.  Understanding expressed, all questions answered  Follow  Up Instructions: As needed.    I discussed the assessment and treatment plan with the patient. The patient was provided an opportunity to ask questions and all were answered. The patient agreed with the plan and demonstrated an understanding of the instructions.   The patient was advised to call back or seek an in-person evaluation if the symptoms worsen or if the condition fails to improve as anticipated.  I provided 19 minutes of non-face-to-face time during this encounter.   Wendie Agreste, MD

## 2019-10-13 ENCOUNTER — Encounter: Payer: Self-pay | Admitting: Family Medicine

## 2019-10-13 ENCOUNTER — Other Ambulatory Visit: Payer: Self-pay

## 2019-10-13 ENCOUNTER — Other Ambulatory Visit: Payer: Self-pay | Admitting: Family Medicine

## 2019-10-13 ENCOUNTER — Ambulatory Visit (INDEPENDENT_AMBULATORY_CARE_PROVIDER_SITE_OTHER): Payer: Medicare HMO | Admitting: Family Medicine

## 2019-10-13 VITALS — BP 140/52 | HR 68 | Temp 98.0°F | Ht 62.0 in | Wt 124.0 lb

## 2019-10-13 DIAGNOSIS — M109 Gout, unspecified: Secondary | ICD-10-CM | POA: Diagnosis not present

## 2019-10-13 DIAGNOSIS — M2550 Pain in unspecified joint: Secondary | ICD-10-CM | POA: Diagnosis not present

## 2019-10-13 DIAGNOSIS — E1165 Type 2 diabetes mellitus with hyperglycemia: Secondary | ICD-10-CM

## 2019-10-13 DIAGNOSIS — R066 Hiccough: Secondary | ICD-10-CM

## 2019-10-13 DIAGNOSIS — E785 Hyperlipidemia, unspecified: Secondary | ICD-10-CM | POA: Diagnosis not present

## 2019-10-13 MED ORDER — ALLOPURINOL 100 MG PO TABS: 200.0000 mg | ORAL_TABLET | Freq: Every day | ORAL | 1 refills | Status: DC

## 2019-10-13 MED ORDER — OMEPRAZOLE 20 MG PO CPDR: 20.0000 mg | DELAYED_RELEASE_CAPSULE | Freq: Every day | ORAL | 1 refills | Status: DC

## 2019-10-13 MED ORDER — BACLOFEN 10 MG PO TABS: 10.0000 mg | ORAL_TABLET | Freq: Three times a day (TID) | ORAL | 0 refills | Status: AC | PRN

## 2019-10-13 MED ORDER — PREDNISONE 20 MG PO TABS: ORAL_TABLET | ORAL | 0 refills | Status: DC

## 2019-10-13 MED ORDER — TRAMADOL HCL 50 MG PO TABS: 50.0000 mg | ORAL_TABLET | Freq: Three times a day (TID) | ORAL | 0 refills | Status: AC | PRN

## 2019-10-13 NOTE — Progress Notes (Signed)
Subjective:  Patient ID: Reginald Long, male    DOB: 1940-08-11  Age: 79 y.o. MRN: [id number]  CC:  Chief Complaint  Patient presents with  . gout flare up    pain in all joints mainly hips/ knees, hands - swelling- request for referral to arthritis doctor for gout - not taking cholchicine   . Hiccups    chronic ran out his meds for it 6 months ago    vietnamese interpreter- [phone]  HPI Reginald Long presents for   Gout: Diffuse with recurrent flares.  Has been evaluated by podiatry as well as orthopedics.  He was referred to rheumatology in July, Dr. Estanislado Pandy.  Has not seen her yet. Appt scheduled 01/18/20.   Should be on allopurinol 100 mg daily for prevention, colchicine for acute treatment. He did report that he was taking allopurinol daily at his August 11 video visit.  Of note he was treated with prednisone 40 mg daily x5 days on August 11 for rash. No gout pain at that time.   Current flare started about 2 weeks ago.  Pain in both feet, both knees, R hip, R wrist, R hand, R elbow.  No fever.  Previous rash improved - no new rash.  Taking allopurinol daily - 144m.  Colchicine for flair - out of meds now. Did not help with this flair.  Tramadol last filled in Nov 2020.  Controlled substance database (PDMP) reviewed. No concerns appreciated.    Lab Results  Component Value Date   LABURIC 9.8 (H) 05/27/2019   History of chronic hiccups: Episodes of hiccups in the past, was treated in April 2019 with baclofen, omeprazole.  No evidence of active disease on chest x-ray at that time.  Lipase was normal.  CBC, CMP normal with exception of slight hyperglycemia Has intermittent belching that can cause hiccups. Current flare past week. .  Diabetes: Prior hyperglycemia. Complicated by CKD. EGFR 45 in April.  Improved A1c in April on Metformin 500 mg twice daily. No new side effects, taking meds as prescribed.  Microalbumin: Normal ratio 8 in April 2021. Optho, foot exam, pneumovax: Due  for Pneumovax, flu vaccine, hep C screening. He is on statin, ACE-I.   Lipids elevated in April - not fasting.  Last 3.5 hrs ago.  Lab Results  Component Value Date   HGBA1C 6.1 (H) 05/27/2019   HGBA1C 6.7 (H) 12/11/2018   Lab Results  Component Value Date   LDLCALC 135 (H) 05/27/2019   CREATININE 1.48 (H) 05/27/2019     History Patient Active Problem List   Diagnosis Date Noted  . Acute appendicitis 12/08/2016  . Non-English speaking patient (Verner Chol 12/08/2016  . Right renal mass 12/08/2016  . CKD (chronic kidney disease), stage III 12/08/2016  . PAF (paroxysmal atrial fibrillation) ([place] 12/08/2016  . Acute gangrenous appendicitis with perforation and peritonitis 12/08/2016  . Viral enteritis 12/04/2015  . Hyperlipidemia 12/04/2015  . Essential hypertension 12/04/2015  . Right foot pain 12/04/2015  . Acute gout of right ankle 12/04/2015  . Melanoma of skin, site unspecified 10/26/2013   Past Medical History:  Diagnosis Date  . Gout   . Hyperlipidemia   . Hypertension   . PAF (paroxysmal atrial fibrillation) ([place] 12/08/2016   Past Surgical History:  Procedure Laterality Date  . LAPAROSCOPIC APPENDECTOMY N/A 12/08/2016   Procedure: APPENDECTOMY LAPAROSCOPIC;  Surgeon: GMichael Boston MD;  Location: [facility];  Service: General;  Laterality: N/A;  . right thumb surgery     No Known Allergies  Prior to Admission medications   Medication Sig Start Date End Date Taking? Authorizing Provider  allopurinol (ZYLOPRIM) 100 MG tablet Take 1 tablet (100 mg total) by mouth daily. 05/27/19   Wendie Agreste, MD  amLODipine (NORVASC) 5 MG tablet Take 1 tablet (5 mg total) by mouth daily. 05/27/19   Wendie Agreste, MD  atorvastatin (LIPITOR) 10 MG tablet TAKE 1 TABLET(10 MG) BY MOUTH DAILY AT 6 PM 05/27/19   Wendie Agreste, MD  blood glucose meter kit and supplies Dispense based on patient and insurance preference.  Once per day - fasting or 2 hours after meal. 01/03/19    Wendie Agreste, MD  colchicine 0.6 MG tablet 2 at onset of gout, 1 tab in 1 hour if needed. 05/27/19   Wendie Agreste, MD  lisinopril (ZESTRIL) 20 MG tablet Take 1 tablet (20 mg total) by mouth daily. 05/27/19   Wendie Agreste, MD  metFORMIN (GLUCOPHAGE) 500 MG tablet Take 1 tablet (500 mg total) by mouth 2 (two) times daily with a meal. 05/27/19   Wendie Agreste, MD  [facility] ULTRA test strip USE AS DIRECTED DAILY 01/31/19   Wendie Agreste, MD  predniSONE (DELTASONE) 20 MG tablet Take 2 tablets (40 mg total) by mouth daily with breakfast. 09/14/19   Wendie Agreste, MD  triamcinolone cream (KENALOG) 0.1 % Apply 1 application topically 3 (three) times daily as needed. 05/27/19   Wendie Agreste, MD  triamcinolone cream (KENALOG) 0.1 % Apply 1 application topically 2 (two) times daily. 09/14/19   Wendie Agreste, MD   Social History   Socioeconomic History  . Marital status: Married    Spouse name: Not on file  . Number of children: Not on file  . Years of education: Not on file  . Highest education level: Not on file  Occupational History  . Not on file  Tobacco Use  . Smoking status: Former Research scientist (life sciences)  . Smokeless tobacco: Never Used  Substance and Sexual Activity  . Alcohol use: No  . Drug use: No  . Sexual activity: Not on file  Other Topics Concern  . Not on file  Social History Narrative   Originally from [place]   Social Determinants of Health   Financial Resource Strain:   . Difficulty of Paying Living Expenses: Not on file  Food Insecurity:   . Worried About Charity fundraiser in the Last Year: Not on file  . Ran Out of Food in the Last Year: Not on file  Transportation Needs:   . Lack of Transportation (Medical): Not on file  . Lack of Transportation (Non-Medical): Not on file  Physical Activity:   . Days of Exercise per Week: Not on file  . Minutes of Exercise per Session: Not on file  Stress:   . Feeling of Stress : Not on file  Social Connections:    . Frequency of Communication with Friends and Family: Not on file  . Frequency of Social Gatherings with Friends and Family: Not on file  . Attends Religious Services: Not on file  . Active Member of Clubs or Organizations: Not on file  . Attends Archivist Meetings: Not on file  . Marital Status: Not on file  Intimate Partner Violence:   . Fear of Current or Ex-Partner: Not on file  . Emotionally Abused: Not on file  . Physically Abused: Not on file  . Sexually Abused: Not on file    Review of  Systems Per hpi   Objective:   Vitals:   10/13/19 1446  BP: (!) 140/52  Pulse: 68  Temp: 98 F (36.7 C)  Weight: 124 lb (56.2 kg)  Height: '5\' 2"'  (1.575 m)     Physical Exam Vitals reviewed.  Constitutional:      Appearance: He is well-developed.  HENT:     Head: Normocephalic and atraumatic.  Eyes:     Pupils: Pupils are equal, round, and reactive to light.  Neck:     Vascular: No carotid bruit or JVD.  Cardiovascular:     Rate and Rhythm: Normal rate and regular rhythm.     Heart sounds: Normal heart sounds. No murmur heard.   Pulmonary:     Effort: Pulmonary effort is normal.     Breath sounds: Normal breath sounds. No rales.  Musculoskeletal:     Comments: Exam in wheelchair.  Swelling of soft tissue of right greater than left midfoot, forefoot, slight soft tissue swelling right ankle.  Right knee greater than left knee with soft tissue swelling, no erythema or wounds. guarded in movement.  Right lumbar paraspinals tender to palpation, no erythema or rash.   Right wrist, right hand with soft tissue swelling, no wound, no rash, no erythema.  Guarded movement.  Also with slight guarded movement and palpation swelling of right elbow.  No erythema/rash.  Skin:    General: Skin is warm and dry.     Findings: No rash.  Neurological:     General: No focal deficit present.     Mental Status: He is alert and oriented to person, place, and time.       45  minutes spent during visit, greater than 50% counseling and assimilation of information, chart review, and discussion of plan.   Assessment & Plan:  Reginald Long is a 79 y.o. male . Polyarthralgia - Plan: predniSONE (DELTASONE) 20 MG tablet, Uric Acid, traMADol (ULTRAM) 50 MG tablet Gout of multiple sites, unspecified cause, unspecified chronicity - Plan: predniSONE (DELTASONE) 20 MG tablet, Uric Acid, allopurinol (ZYLOPRIM) 100 MG tablet, traMADol (ULTRAM) 50 MG tablet  -Appears to be more significant flare of gout, multiple sites involved.   -Start prednisone taper, potential side effects have been discussed, will need to monitor impact on diabetes.  -Tramadol as needed for pain, side effects discussed  -After a few days on prednisone, can increase allopurinol dose to 200 mg daily.  -Recheck 1 month,. ER/RTC precautions given sooner if worse  -Has referral in place for rheumatologist, potentially may be able to see provider sooner than December.  Hiccups - Plan: baclofen (LIORESAL) 10 MG tablet, omeprazole (PRILOSEC) 20 MG capsule  -Start omeprazole for possible GERD component, baclofen 3 times daily as needed with side effects/risk discussed.  Type 2 diabetes mellitus with hyperglycemia, without long-term current use of insulin (HCC) - Plan: Hemoglobin A1c, Comprehensive metabolic panel  -Check U1L, glucose on CMP.  Prednisone effect on blood sugar discussed.  RTC precautions.  Hyperlipidemia, unspecified hyperlipidemia type - Plan: Lipid panel  -Check lipids, uric acid with fasting lab visit in 2 weeks.  Meds ordered this encounter  Medications  . baclofen (LIORESAL) 10 MG tablet    Sig: Take 1 tablet (10 mg total) by mouth 3 (three) times daily as needed (for hiccups.).    Dispense:  30 each    Refill:  0  . predniSONE (DELTASONE) 20 MG tablet    Sig: 3 by mouth for 3 days, then 2 by mouth for  2 days, then 1 by mouth for 2 days, then 1/2 by mouth for 2 days.    Dispense:  16 tablet     Refill:  0  . omeprazole (PRILOSEC) 20 MG capsule    Sig: Take 1 capsule (20 mg total) by mouth daily.    Dispense:  30 capsule    Refill:  1  . allopurinol (ZYLOPRIM) 100 MG tablet    Sig: Take 2 tablets (200 mg total) by mouth daily.    Dispense:  90 tablet    Refill:  1  . traMADol (ULTRAM) 50 MG tablet    Sig: Take 1 tablet (50 mg total) by mouth every 8 (eight) hours as needed for up to 5 days.    Dispense:  15 tablet    Refill:  0   Patient Instructions   Prednisone for current gout flair. Tramadol if needed for pain. If not improving in next week (or worse sooner) return for recheck.  Continue allopurinol, but increase to 2 per day in 2 days.   I will check blood sugar today.  Baclofen if needed up to 3 times per day for hiccups. Because lobation or sleepiness, be careful combining that with tramadol pain medicine.  omeprazole once per day for now as well for possible heartburn causing your hiccups.  Return to the clinic or go to the nearest emergency room if any of your symptoms worsen or new symptoms occur.   Recheck in next 2 weeks for lab only visit for fasting labs, then office visit with me in 1 month.      If you have lab work done today you will be contacted with your lab results within the next 2 weeks.  If you have not heard from [place] then please contact us. The fastest way to get your results is to register for My Chart.   IF you received an x-ray today, you will receive an invoice from [facility]. Please contact [facility] at [phone] with questions or concerns regarding your invoice.   IF you received labwork today, you will receive an invoice from [place]. Please contact [facility] at [phone] with questions or concerns regarding your invoice.   Our billing staff will not be able to assist you with questions regarding bills from these companies.  You will be contacted with the lab results as soon as they are available. The fastest  way to get your results is to activate your My Chart account. Instructions are located on the last page of this paperwork. If you have not heard from [place] regarding the results in 2 weeks, please contact this office.          Signed, Merri Ray, MD [facility]

## 2019-10-13 NOTE — Patient Instructions (Addendum)
Prednisone for current gout flair. Tramadol if needed for pain. If not improving in next week (or worse sooner) return for recheck.  Continue allopurinol, but increase to 2 per day in 2 days.   I will check blood sugar today.  Baclofen if needed up to 3 times per day for hiccups. Because lobation or sleepiness, be careful combining that with tramadol pain medicine.  omeprazole once per day for now as well for possible heartburn causing your hiccups.  Return to the clinic or go to the nearest emergency room if any of your symptoms worsen or new symptoms occur.   Recheck in next 2 weeks for lab only visit for fasting labs, then office visit with me in 1 month.      If you have lab work done today you will be contacted with your lab results within the next 2 weeks.  If you have not heard from [place] then please contact us. The fastest way to get your results is to register for My Chart.   IF you received an x-ray today, you will receive an invoice from [facility]. Please contact [facility] at [phone] with questions or concerns regarding your invoice.   IF you received labwork today, you will receive an invoice from [place]. Please contact [facility] at [phone] with questions or concerns regarding your invoice.   Our billing staff will not be able to assist you with questions regarding bills from these companies.  You will be contacted with the lab results as soon as they are available. The fastest way to get your results is to activate your My Chart account. Instructions are located on the last page of this paperwork. If you have not heard from [place] regarding the results in 2 weeks, please contact this office.

## 2019-10-14 ENCOUNTER — Telehealth: Payer: Self-pay | Admitting: Family Medicine

## 2019-10-14 LAB — COMPREHENSIVE METABOLIC PANEL
ALT: 20 IU/L (ref 0–44)
AST: 23 IU/L (ref 0–40)
Albumin/Globulin Ratio: 1.5 (ref 1.2–2.2)
Albumin: 4.1 g/dL (ref 3.7–4.7)
Alkaline Phosphatase: 115 IU/L (ref 48–121)
BUN/Creatinine Ratio: 21 (ref 10–24)
BUN: 22 mg/dL (ref 8–27)
Bilirubin Total: 0.3 mg/dL (ref 0.0–1.2)
CO2: 21 mmol/L (ref 20–29)
Calcium: 9.3 mg/dL (ref 8.6–10.2)
Chloride: 106 mmol/L (ref 96–106)
Creatinine, Ser: 1.05 mg/dL (ref 0.76–1.27)
GFR calc Af Amer: 78 mL/min/{1.73_m2} (ref >59)
GFR calc non Af Amer: 68 mL/min/{1.73_m2} (ref >59)
Globulin, Total: 2.8 g/dL (ref 1.5–4.5)
Glucose: 106 mg/dL — ABNORMAL HIGH (ref 65–99)
Potassium: 4.4 mmol/L (ref 3.5–5.2)
Sodium: 143 mmol/L (ref 134–144)
Total Protein: 6.9 g/dL (ref 6.0–8.5)

## 2019-10-14 LAB — HEMOGLOBIN A1C
Est. average glucose Bld gHb Est-mCnc: 140 mg/dL
Hgb A1c MFr Bld: 6.5 % — ABNORMAL HIGH (ref 4.8–5.6)

## 2019-10-14 NOTE — Telephone Encounter (Signed)
10/14/2019 - PATIENT HAD AN OFFICE VISIT WITH DR. Carlota Raspberry ON Thursday (10/13/2019). DR. Carlota Raspberry HAS REQUESTED PATIENT RETURN IN 2 WEEKS TO HAVE A NURSE'S VISIT. THE ORDERS ARE IN. I TRIED TO CALL AND SCHEDULE BUT HAD TO LEAVE A MESSAGE ON HIS VOICE MAIL TO RETURN MY CALL. HE ALREADY HAS A 1 MONTH F/U WITH DR. Carlota Raspberry ON Friday 11/25/2019 AT 2:40pm. [place]

## 2019-10-18 ENCOUNTER — Encounter: Payer: Self-pay | Admitting: Radiology

## 2019-10-24 ENCOUNTER — Other Ambulatory Visit: Payer: Self-pay

## 2019-10-24 ENCOUNTER — Ambulatory Visit (INDEPENDENT_AMBULATORY_CARE_PROVIDER_SITE_OTHER): Payer: Medicare HMO | Admitting: Family Medicine

## 2019-10-24 DIAGNOSIS — M109 Gout, unspecified: Secondary | ICD-10-CM | POA: Diagnosis not present

## 2019-10-24 DIAGNOSIS — M2550 Pain in unspecified joint: Secondary | ICD-10-CM

## 2019-10-24 DIAGNOSIS — E785 Hyperlipidemia, unspecified: Secondary | ICD-10-CM | POA: Diagnosis not present

## 2019-10-25 ENCOUNTER — Encounter: Payer: Self-pay | Admitting: Family Medicine

## 2019-10-25 ENCOUNTER — Encounter: Payer: Self-pay | Admitting: Radiology

## 2019-10-25 LAB — LIPID PANEL
Chol/HDL Ratio: 3 ratio (ref 0.0–5.0)
Cholesterol, Total: 164 mg/dL (ref 100–199)
HDL: 54 mg/dL (ref >39)
LDL Chol Calc (NIH): 91 mg/dL (ref 0–99)
Triglycerides: 104 mg/dL (ref 0–149)
VLDL Cholesterol Cal: 19 mg/dL (ref 5–40)

## 2019-10-25 LAB — URIC ACID: Uric Acid: 4.5 mg/dL (ref 3.8–8.4)

## 2019-10-25 NOTE — Progress Notes (Signed)
Nurse visit only, patient not examined/evaluated.

## 2019-10-30 ENCOUNTER — Encounter (HOSPITAL_COMMUNITY): Payer: Self-pay

## 2019-10-30 ENCOUNTER — Other Ambulatory Visit: Payer: Self-pay

## 2019-10-30 ENCOUNTER — Ambulatory Visit (HOSPITAL_COMMUNITY)
Admission: RE | Admit: 2019-10-30 | Discharge: 2019-10-30 | Disposition: A | Discharge status: Home / Self Care | Payer: Medicare HMO | Source: Ambulatory Visit | Attending: Urgent Care | Admitting: Urgent Care

## 2019-10-30 VITALS — BP 117/65 | HR 71 | Temp 97.9°F | Resp 18

## 2019-10-30 DIAGNOSIS — M109 Gout, unspecified: Secondary | ICD-10-CM | POA: Diagnosis not present

## 2019-10-30 DIAGNOSIS — M2550 Pain in unspecified joint: Secondary | ICD-10-CM

## 2019-10-30 DIAGNOSIS — Z8739 Personal history of other diseases of the musculoskeletal system and connective tissue: Secondary | ICD-10-CM

## 2019-10-30 MED ORDER — PREDNISONE 20 MG PO TABS: ORAL_TABLET | ORAL | 0 refills | Status: DC

## 2019-10-30 NOTE — ED Provider Notes (Signed)
Reginald Long   MRN: [id number] DOB: Mar 31, 1940  Subjective:   Reginald Long is a 79 y.o. male presenting for 1 month history of persistent multiple joint pains.  Pain is worse over his right ankle, right knee and both his wrist.  Has a strong history of gout.  Has been using colchicine, allopurinol and tramadol with minimal relief.  Does have a PCP, Dr. Carlota Long.  Has been following his uric acid levels and the last one this month was improved.  Has an A1c of 6.5% as of this month as well.  Denies falls, trauma.  No current facility-administered medications for this encounter.  Current Outpatient Medications:  .  allopurinol (ZYLOPRIM) 100 MG tablet, Take 2 tablets (200 mg total) by mouth daily., Disp: 90 tablet, Rfl: 1 .  amLODipine (NORVASC) 5 MG tablet, Take 1 tablet (5 mg total) by mouth daily., Disp: 90 tablet, Rfl: 1 .  atorvastatin (LIPITOR) 10 MG tablet, TAKE 1 TABLET(10 MG) BY MOUTH DAILY AT 6 PM, Disp: 90 tablet, Rfl: 1 .  baclofen (LIORESAL) 10 MG tablet, Take 1 tablet (10 mg total) by mouth 3 (three) times daily as needed (for hiccups.)., Disp: 30 each, Rfl: 0 .  blood glucose meter kit and supplies, Dispense based on patient and insurance preference.  Once per day - fasting or 2 hours after meal., Disp: 1 each, Rfl: 0 .  colchicine 0.6 MG tablet, 2 at onset of gout, 1 tab in 1 hour if needed., Disp: 15 tablet, Rfl: 2 .  lisinopril (ZESTRIL) 20 MG tablet, Take 1 tablet (20 mg total) by mouth daily., Disp: 90 tablet, Rfl: 1 .  metFORMIN (GLUCOPHAGE) 500 MG tablet, Take 1 tablet (500 mg total) by mouth 2 (two) times daily with a meal., Disp: 180 tablet, Rfl: 1 .  omeprazole (PRILOSEC) 20 MG capsule, Take 1 capsule (20 mg total) by mouth daily., Disp: 30 capsule, Rfl: 1 .  ONETOUCH ULTRA test strip, USE AS DIRECTED DAILY, Disp: 300 strip, Rfl: 1 .  predniSONE (DELTASONE) 20 MG tablet, 3 by mouth for 3 days, then 2 by mouth for 2 days, then 1 by mouth for 2 days, then 1/2 by  mouth for 2 days., Disp: 16 tablet, Rfl: 0 .  triamcinolone cream (KENALOG) 0.1 %, Apply 1 application topically 3 (three) times daily as needed., Disp: 30 g, Rfl: 0 .  triamcinolone cream (KENALOG) 0.1 %, Apply 1 application topically 2 (two) times daily., Disp: 30 g, Rfl: 0   No Known Allergies  Past Medical History:  Diagnosis Date  . Gout   . Hyperlipidemia   . Hypertension   . PAF (paroxysmal atrial fibrillation) ([place]) 12/08/2016     Past Surgical History:  Procedure Laterality Date  . LAPAROSCOPIC APPENDECTOMY N/A 12/08/2016   Procedure: APPENDECTOMY LAPAROSCOPIC;  Surgeon: [name], MD;  Location: [facility];  Service: General;  Laterality: N/A;  . right thumb surgery      History reviewed. No pertinent family history.  Social History   Tobacco Use  . Smoking status: Former Research scientist (life sciences)  . Smokeless tobacco: Never Used  Substance Use Topics  . Alcohol use: No  . Drug use: No    ROS   Objective:   Vitals: BP 117/65 (BP Location: Left Arm)   Pulse 71   Temp 97.9 F (36.6 C) (Oral)   Resp 18   SpO2 100%   Physical Exam Constitutional:      General: He is not in acute distress.  Appearance: Normal appearance. He is well-developed and normal weight. He is not ill-appearing, toxic-appearing or diaphoretic.  HENT:     Head: Normocephalic and atraumatic.     Right Ear: External ear normal.     Left Ear: External ear normal.     Nose: Nose normal.     Mouth/Throat:     Pharynx: Oropharynx is clear.  Eyes:     General: No scleral icterus.       Right eye: No discharge.        Left eye: No discharge.     Extraocular Movements: Extraocular movements intact.     Pupils: Pupils are equal, round, and reactive to light.  Cardiovascular:     Rate and Rhythm: Normal rate.  Pulmonary:     Effort: Pulmonary effort is normal.  Musculoskeletal:     Cervical back: Normal range of motion.     Right lower leg: No edema.     Left lower leg: No edema.     Comments:  Tenderness of both wrists, worse of the right wrist.  Decreased range of motion of the right wrist.  Right wrist with 1+ swelling, left wrist normal.  Also has right ankle tenderness throughout with slight warmth.  Minimal swelling.  Right knee with exquisite tenderness, warmth and 1+ swelling.  Skin:    General: Skin is warm and dry.  Neurological:     Mental Status: He is alert and oriented to person, place, and time.  Psychiatric:        Mood and Affect: Mood normal.        Behavior: Behavior normal.        Thought Content: Thought content normal.        Judgment: Judgment normal.      Assessment and Plan :   PDMP not reviewed this encounter.  1. Acute gout of multiple sites, unspecified cause   2. Multiple joint pain   3. History of gout     Recommended patient hold allopurinol, colchicine.  Start prednisone course to address gout of multiple sites.  Recommended he continue tramadol as needed for pain.  Follow-up with PCP, Dr. Carlota Long, as soon as possible. Counseled patient on potential for adverse effects with medications prescribed/recommended today, ER and return-to-clinic precautions discussed, patient verbalized understanding.    Jaynee Eagles, PA-C 10/30/19 1236

## 2019-10-30 NOTE — Discharge Instructions (Signed)
Hold off on using allopurinol daily for now until your current gout attack resolves. Since colchicine is not helping your now stop taking it. Use the prednisone course to see if this helps better. If in the future you get a gout attack it is okay to use this medication. But for now let's try the oral steroid.

## 2019-10-30 NOTE — ED Triage Notes (Signed)
Pt presents with pain and swelling in the right foot, right leg  x 1 month. Pt reports the allopurinol 100 mg and Tramadol 50 mg is not helping.

## 2019-11-14 ENCOUNTER — Other Ambulatory Visit: Payer: Self-pay | Admitting: Family Medicine

## 2019-11-14 DIAGNOSIS — I10 Essential (primary) hypertension: Secondary | ICD-10-CM

## 2019-11-21 DIAGNOSIS — R69 Illness, unspecified: Secondary | ICD-10-CM | POA: Diagnosis not present

## 2019-11-23 ENCOUNTER — Other Ambulatory Visit: Payer: Self-pay | Admitting: Family Medicine

## 2019-11-23 NOTE — Telephone Encounter (Signed)
Requested medications are due for refill today yes  Requested medications are on the active medication list yes  Last refill 10/11  Last visit 9/26  Future visit scheduled Nov 2021  Notes to clinic Unsure if this med was to be continued. Please assesss.

## 2019-11-24 ENCOUNTER — Other Ambulatory Visit: Payer: Self-pay | Admitting: Family Medicine

## 2019-11-24 DIAGNOSIS — I10 Essential (primary) hypertension: Secondary | ICD-10-CM

## 2019-11-24 DIAGNOSIS — E1165 Type 2 diabetes mellitus with hyperglycemia: Secondary | ICD-10-CM

## 2019-11-25 ENCOUNTER — Ambulatory Visit: Payer: Medicare HMO | Admitting: Family Medicine

## 2019-11-25 ENCOUNTER — Other Ambulatory Visit: Payer: Self-pay | Admitting: Family Medicine

## 2019-11-25 DIAGNOSIS — E785 Hyperlipidemia, unspecified: Secondary | ICD-10-CM

## 2019-11-26 ENCOUNTER — Encounter (HOSPITAL_COMMUNITY): Payer: Self-pay

## 2019-11-26 ENCOUNTER — Ambulatory Visit (HOSPITAL_COMMUNITY): Payer: Medicare HMO

## 2019-11-26 ENCOUNTER — Other Ambulatory Visit: Payer: Self-pay

## 2019-11-26 ENCOUNTER — Ambulatory Visit (HOSPITAL_COMMUNITY)
Admission: EM | Admit: 2019-11-26 | Discharge: 2019-11-26 | Disposition: A | Discharge status: Home / Self Care | Payer: Medicare HMO | Attending: Family Medicine | Admitting: Family Medicine

## 2019-11-26 DIAGNOSIS — M2550 Pain in unspecified joint: Secondary | ICD-10-CM | POA: Diagnosis not present

## 2019-11-26 DIAGNOSIS — L299 Pruritus, unspecified: Secondary | ICD-10-CM

## 2019-11-26 MED ORDER — PREDNISONE 20 MG PO TABS: ORAL_TABLET | ORAL | 0 refills | Status: DC

## 2019-11-26 MED ORDER — CETIRIZINE HCL 10 MG PO TABS: 10.0000 mg | ORAL_TABLET | Freq: Every day | ORAL | 1 refills | Status: AC

## 2019-11-26 NOTE — ED Provider Notes (Signed)
[facility]    CSN: [id number] Arrival date & time: 11/26/19  1151      History   Chief Complaint Chief Complaint  Patient presents with  . Appointment  . Gout    HPI Reginald Long is a 79 y.o. male.   Visit today facilitated by medical translator as well as family member who presents with him today. He is complaining of b/l knee pain and mild swelling and right wrist pain the past week or so. Denies redness, heat, injury to areas. Does have a known hx of gout, compliant with allopurinol regimen and recent uric acid WNL. Last treated 1 month ago for suspected gout flare with prednisone which he states cleared things up for a while. He also notes some diffuse itching without rash which is fairly new. No new exposures, wheezing, CP, SOB.      Past Medical History:  Diagnosis Date  . Gout   . Hyperlipidemia   . Hypertension   . PAF (paroxysmal atrial fibrillation) ([place]) 12/08/2016    Patient Active Problem List   Diagnosis Date Noted  . Acute appendicitis 12/08/2016  . Non-English speaking patient [name] 12/08/2016  . Right renal mass 12/08/2016  . CKD (chronic kidney disease), stage III ([place]) 12/08/2016  . PAF (paroxysmal atrial fibrillation) ([place]) 12/08/2016  . Acute gangrenous appendicitis with perforation and peritonitis 12/08/2016  . Viral enteritis 12/04/2015  . Hyperlipidemia 12/04/2015  . Essential hypertension 12/04/2015  . Right foot pain 12/04/2015  . Acute gout of right ankle 12/04/2015  . Melanoma of skin, site unspecified 10/26/2013    Past Surgical History:  Procedure Laterality Date  . LAPAROSCOPIC APPENDECTOMY N/A 12/08/2016   Procedure: APPENDECTOMY LAPAROSCOPIC;  Surgeon: [name], MD;  Location: [facility];  Service: General;  Laterality: N/A;  . right thumb surgery         Home Medications    Prior to Admission medications   Medication Sig Start Date End Date Taking? Authorizing Provider  allopurinol (ZYLOPRIM) 100 MG tablet  Take 2 tablets (200 mg total) by mouth daily. 10/13/19   Wendie Agreste, MD  amLODipine (NORVASC) 5 MG tablet TAKE 1 TABLET(5 MG) BY MOUTH DAILY 11/24/19   Wendie Agreste, MD  atorvastatin (LIPITOR) 10 MG tablet TAKE 1 TABLET(10 MG) BY MOUTH DAILY AT 6 PM 11/25/19   Wendie Agreste, MD  baclofen (LIORESAL) 10 MG tablet Take 1 tablet (10 mg total) by mouth 3 (three) times daily as needed (for hiccups.). 10/13/19   Wendie Agreste, MD  blood glucose meter kit and supplies Dispense based on patient and insurance preference.  Once per day - fasting or 2 hours after meal. 01/03/19   Wendie Agreste, MD  cetirizine (ZYRTEC ALLERGY) 10 MG tablet Take 1 tablet (10 mg total) by mouth daily. 11/26/19   Volney American, PA-C  colchicine 0.6 MG tablet 2 at onset of gout, 1 tab in 1 hour if needed. 05/27/19   Wendie Agreste, MD  lisinopril (ZESTRIL) 20 MG tablet TAKE 1 TABLET(20 MG) BY MOUTH DAILY 11/14/19   Wendie Agreste, MD  metFORMIN (GLUCOPHAGE) 500 MG tablet TAKE 1 TABLET(500 MG) BY MOUTH TWICE DAILY WITH A MEAL 11/24/19   Wendie Agreste, MD  omeprazole (PRILOSEC) 20 MG capsule Take 1 capsule (20 mg total) by mouth daily. 10/13/19   Wendie Agreste, MD  [facility] ULTRA test strip USE AS DIRECTED DAILY 01/31/19   Wendie Agreste, MD  predniSONE (DELTASONE) 20 MG  tablet Take 2 tablets daily with breakfast. 11/26/19   Volney American, PA-C  triamcinolone cream (KENALOG) 0.1 % Apply 1 application topically 3 (three) times daily as needed. 05/27/19   Wendie Agreste, MD  triamcinolone cream (KENALOG) 0.1 % Apply 1 application topically 2 (two) times daily. 09/14/19   Wendie Agreste, MD    Family History History reviewed. No pertinent family history.  Social History Social History   Tobacco Use  . Smoking status: Former Research scientist (life sciences)  . Smokeless tobacco: Never Used  Substance Use Topics  . Alcohol use: No  . Drug use: No     Allergies   Patient has no known  allergies.   Review of Systems Review of Systems PER HPI    Physical Exam Triage Vital Signs ED Triage Vitals [11/26/19 1214]  Enc Vitals Group     BP 118/67     Pulse Rate 95     Resp 17     Temp 97.8 F (36.6 C)     Temp Source Oral     SpO2 100 %     Weight      Height      Head Circumference      Peak Flow      Pain Score 7     Pain Loc      Pain Edu?      Excl. in [place]?    No data found.  Updated Vital Signs BP 118/67 (BP Location: Left Arm)   Pulse 95   Temp 97.8 F (36.6 C) (Oral)   Resp 17   SpO2 100%   Visual Acuity Right Eye Distance:   Left Eye Distance:   Bilateral Distance:    Right Eye Near:   Left Eye Near:    Bilateral Near:     Physical Exam Vitals and nursing note reviewed.  Constitutional:      Appearance: Normal appearance.  HENT:     Head: Atraumatic.  Eyes:     Extraocular Movements: Extraocular movements intact.     Conjunctiva/sclera: Conjunctivae normal.  Cardiovascular:     Rate and Rhythm: Normal rate and regular rhythm.  Pulmonary:     Effort: Pulmonary effort is normal.     Breath sounds: Normal breath sounds.  Musculoskeletal:        General: Tenderness (ttp b/l knees at joint lines bilaterally) present. No swelling (no appreciable swelling or redness at all 3 joints). Normal range of motion.     Cervical back: Normal range of motion and neck supple.  Skin:    General: Skin is warm and dry.     Findings: No erythema or rash.  Neurological:     General: No focal deficit present.     Mental Status: He is oriented to person, place, and time.  Psychiatric:        Mood and Affect: Mood normal.        Thought Content: Thought content normal.        Judgment: Judgment normal.      UC Treatments / Results  Labs (all labs ordered are listed, but only abnormal results are displayed) Labs Reviewed - No data to display  EKG   Radiology No results found.  Procedures Procedures (including critical care  time)  Medications Ordered in UC Medications - No data to display  Initial Impression / Assessment and Plan / UC Course  I have reviewed the triage vital signs and the nursing notes.  Pertinent labs & imaging  results that were available during my care of the patient were reviewed by me and considered in my medical decision making (see chart for details).     Suspect his multi-joint pains are more OA than gout at this time, but will give small prednisone burst to help and recommended OTC tylenol, diclofenac gel for ongoing maintenance. He will f/u with PCP for recheck in the next few weeks. Will also send over zyrtec for the itching in case allergic response to somet Final Clinical Impressions(s) / UC Diagnoses   Final diagnoses:  Polyarthralgia  Itching   Discharge Instructions   None    ED Prescriptions    Medication Sig Dispense Auth. Provider   predniSONE (DELTASONE) 20 MG tablet Take 2 tablets daily with breakfast. 10 tablet Volney American, PA-C   cetirizine (ZYRTEC ALLERGY) 10 MG tablet Take 1 tablet (10 mg total) by mouth daily. 30 tablet Volney American, [place]     PDMP not reviewed this encounter.   Volney American, [place] 11/26/19 1700

## 2019-11-26 NOTE — ED Triage Notes (Signed)
Pt presents with recurring gout flare up in both knees and hands X 3 days.

## 2019-12-12 ENCOUNTER — Encounter: Payer: Self-pay | Admitting: Family Medicine

## 2019-12-12 ENCOUNTER — Ambulatory Visit (INDEPENDENT_AMBULATORY_CARE_PROVIDER_SITE_OTHER): Payer: Medicare HMO

## 2019-12-12 ENCOUNTER — Other Ambulatory Visit: Payer: Self-pay

## 2019-12-12 ENCOUNTER — Ambulatory Visit (INDEPENDENT_AMBULATORY_CARE_PROVIDER_SITE_OTHER): Payer: Medicare HMO | Admitting: Family Medicine

## 2019-12-12 VITALS — BP 144/68 | HR 60 | Temp 98.4°F | Ht 62.0 in | Wt 123.0 lb

## 2019-12-12 DIAGNOSIS — E1165 Type 2 diabetes mellitus with hyperglycemia: Secondary | ICD-10-CM

## 2019-12-12 DIAGNOSIS — E785 Hyperlipidemia, unspecified: Secondary | ICD-10-CM

## 2019-12-12 DIAGNOSIS — M545 Low back pain, unspecified: Secondary | ICD-10-CM | POA: Diagnosis not present

## 2019-12-12 DIAGNOSIS — M25551 Pain in right hip: Secondary | ICD-10-CM

## 2019-12-12 DIAGNOSIS — M2550 Pain in unspecified joint: Secondary | ICD-10-CM | POA: Diagnosis not present

## 2019-12-12 DIAGNOSIS — M5136 Other intervertebral disc degeneration, lumbar region: Secondary | ICD-10-CM | POA: Diagnosis not present

## 2019-12-12 NOTE — Progress Notes (Signed)
Subjective:  Patient ID: Reginald Long, male    DOB: Jun 26, 1940  Age: 79 y.o. MRN: [id number]  CC:  Chief Complaint  Patient presents with  . Follow-up    on diabetes and hyperlipidemia. PT hasing had any issues with these conditions since last OV. Pt reports he is still taking his medication as directed wioth no side effects that he is aware of.Pt is avoiding sugary foods.  . Hip Pain    Pt reports starting 1 month ago after th pt walks for 5 mins or more his Hip on the R side will cause him pain. No known trama to the area.   Here with granddaughter - declined Marketing executive,   HPI Reginald Long presents for   Diabetes: With hyperglycemia previously, stable control based on most recent A1c. Metformin 500 mg twice daily.  He is on ACE inhibitor as well as statin Microalbumin: Normal ratio 05/27/2019 Home readings: not recent.   Lab Results  Component Value Date   HGBA1C 6.5 (H) 10/13/2019   HGBA1C 6.1 (H) 05/27/2019   HGBA1C 6.7 (H) 12/11/2018   Lab Results  Component Value Date   LDLCALC 91 10/24/2019   CREATININE 1.05 10/13/2019   Hyperlipidemia: Lipitor 10 mg daily, labs in September.  Still taking atorvastatin daily. polyartharlgia in past, doing ok now - just R hip.  Lab Results  Component Value Date   CHOL 164 10/24/2019   HDL 54 10/24/2019   LDLCALC 91 10/24/2019   TRIG 104 10/24/2019   CHOLHDL 3.0 10/24/2019   Lab Results  Component Value Date   ALT 20 10/13/2019   AST 23 10/13/2019   ALKPHOS 115 10/13/2019   BILITOT 0.3 10/13/2019    R hip pain with history of gout. Evaluated at [facility] health urgent care October 23.  Last flare: October 23 with bilateral knee pain and right wrist pain had also been treated approximately 1 month previous with prednisone.  Polyarthralgia 30 possible osteoarthritis versus gout.  Prednisone given - 73m for 5 days,  over-the-counter Tylenol, diclofenac gel.  Daily meds:  Allopurinol 200 mg daily -dosage increase in September.  tolearting this dose.   Tramadol as needed for pain previously.  R hip pain: past 1 month. R buttock, no lower leg radiation. Off and on. NKI.  No bowel or bladder incontinence, no saddle anesthesia, no lower extremity weakness.  No back pain. Does have R low back pain at times.     Lab Results  Component Value Date   LABURIC 4.5 10/24/2019       History Patient Active Problem List   Diagnosis Date Noted  . Acute appendicitis 12/08/2016  . Non-English speaking patient ([place] 12/08/2016  . Right renal mass 12/08/2016  . CKD (chronic kidney disease), stage III ([facility] 12/08/2016  . PAF ([place]oxysmal atrial fibrillation) ([facility] 12/08/2016  . Acute gangrenous appendicitis with perforation and peritonitis 12/08/2016  . Viral enteritis 12/04/2015  . Hyperlipidemia 12/04/2015  . Essential hypertension 12/04/2015  . Right foot pain 12/04/2015  . Acute gout of right ankle 12/04/2015  . Melanoma of skin, site unspecified 10/26/2013   Past Medical History:  Diagnosis Date  . Gout   . Hyperlipidemia   . Hypertension   . [place] ([place]oxysmal atrial fibrillation) ([place] 12/08/2016   Past Surgical History:  Procedure Laterality Date  . LAPAROSCOPIC APPENDECTOMY N/A 12/08/2016   Procedure: APPENDECTOMY LAPAROSCOPIC;  Surgeon: GMichael Boston MD;  Location: [facility];  Service: General;  Laterality: N/A;  . right thumb surgery  No Known Allergies Prior to Admission medications   Medication Sig Start Date End Date Taking? Authorizing Provider  allopurinol (ZYLOPRIM) 100 MG tablet Take 2 tablets (200 mg total) by mouth daily. 10/13/19  Yes Wendie Agreste, MD  amLODipine (NORVASC) 5 MG tablet TAKE 1 TABLET(5 MG) BY MOUTH DAILY 11/24/19  Yes Wendie Agreste, MD  atorvastatin (LIPITOR) 10 MG tablet TAKE 1 TABLET(10 MG) BY MOUTH DAILY AT 6 PM 11/25/19  Yes Wendie Agreste, MD  baclofen (LIORESAL) 10 MG tablet Take 1 tablet (10 mg total) by mouth 3 (three) times daily as needed (for hiccups.).  10/13/19  Yes Wendie Agreste, MD  blood glucose meter kit and supplies Dispense based on patient and insurance preference.  Once per day - fasting or 2 hours after meal. 01/03/19  Yes Wendie Agreste, MD  cetirizine (ZYRTEC ALLERGY) 10 MG tablet Take 1 tablet (10 mg total) by mouth daily. 11/26/19  Yes Volney American, PA-C  colchicine 0.6 MG tablet 2 at onset of gout, 1 tab in 1 hour if needed. 05/27/19  Yes Wendie Agreste, MD  lisinopril (ZESTRIL) 20 MG tablet TAKE 1 TABLET(20 MG) BY MOUTH DAILY 11/14/19  Yes Wendie Agreste, MD  metFORMIN (GLUCOPHAGE) 500 MG tablet TAKE 1 TABLET(500 MG) BY MOUTH TWICE DAILY WITH A MEAL 11/24/19  Yes Wendie Agreste, MD  omeprazole (PRILOSEC) 20 MG capsule Take 1 capsule (20 mg total) by mouth daily. 10/13/19  Yes Wendie Agreste, MD  [facility] ULTRA test strip USE AS DIRECTED DAILY 01/31/19  Yes Wendie Agreste, MD  predniSONE (DELTASONE) 20 MG tablet Take 2 tablets daily with breakfast. 11/26/19  Yes Volney American, PA-C  triamcinolone cream (KENALOG) 0.1 % Apply 1 application topically 3 (three) times daily as needed. 05/27/19  Yes Wendie Agreste, MD  triamcinolone cream (KENALOG) 0.1 % Apply 1 application topically 2 (two) times daily. 09/14/19  Yes Wendie Agreste, MD   Social History   Socioeconomic History  . Marital status: Married    Spouse name: Not on file  . Number of children: Not on file  . Years of education: Not on file  . Highest education level: Not on file  Occupational History  . Not on file  Tobacco Use  . Smoking status: Former Research scientist (life sciences)  . Smokeless tobacco: Never Used  Substance and Sexual Activity  . Alcohol use: No  . Drug use: No  . Sexual activity: Not on file  Other Topics Concern  . Not on file  Social History Narrative   Originally from [place]   Social Determinants of Health   Financial Resource Strain:   . Difficulty of Paying Living Expenses: Not on file  Food Insecurity:   . Worried  About Charity fundraiser in the Last Year: Not on file  . Ran Out of Food in the Last Year: Not on file  Transportation Needs:   . Lack of Transportation (Medical): Not on file  . Lack of Transportation (Non-Medical): Not on file  Physical Activity:   . Days of Exercise per Week: Not on file  . Minutes of Exercise per Session: Not on file  Stress:   . Feeling of Stress : Not on file  Social Connections:   . Frequency of Communication with Friends and Family: Not on file  . Frequency of Social Gatherings with Friends and Family: Not on file  . Attends Religious Services: Not on file  . Active Member of  Clubs or Organizations: Not on file  . Attends Archivist Meetings: Not on file  . Marital Status: Not on file  Intimate Partner Violence:   . Fear of Current or Ex-Partner: Not on file  . Emotionally Abused: Not on file  . Physically Abused: Not on file  . Sexually Abused: Not on file    Review of Systems   Objective:   Vitals:   12/12/19 1450 12/12/19 1509  BP: (!) 143/68 (!) 144/68  Pulse: 60   Temp: 98.4 F (36.9 C)   TempSrc: Temporal   SpO2: 97%   Weight: 123 lb (55.8 kg)   Height: '5\' 2"'  (1.575 m)      Physical Exam Vitals reviewed.  Constitutional:      Appearance: He is well-developed.  HENT:     Head: Normocephalic and atraumatic.  Eyes:     Pupils: Pupils are equal, round, and reactive to light.  Neck:     Vascular: No carotid bruit or JVD.  Cardiovascular:     Rate and Rhythm: Normal rate and regular rhythm.     Heart sounds: Normal heart sounds. No murmur heard.   Pulmonary:     Effort: Pulmonary effort is normal.     Breath sounds: Normal breath sounds. No rales.  Abdominal:     General: Abdomen is flat.  Musculoskeletal:     Comments: Right hip pain-free range of motion, trochanteric bursa nontender, negative seated straight leg raise.  Slight discomfort over sciatic notch.  No focal bony tenderness.  Ambulating without  assistance.  Pain-free range of motion of knees bilaterally, wrists, hands, no focal swelling appreciated.  Skin:    General: Skin is warm and dry.  Neurological:     Mental Status: He is alert and oriented to person, place, and time.  Psychiatric:        Mood and Affect: Mood normal.        Behavior: Behavior normal.     Assessment & Plan:  Reginald Long is a 79 y.o. male . Right hip pain - Plan: DG HIP UNILAT W OR W/O PELVIS 2-3 VIEWS RIGHT Right low back pain, unspecified chronicity, unspecified whether sciatica present - Plan: DG Lumbar Spine 2-3 Views  -Overall reassuring exam, check x-ray of hip and low back.  Possible lumbar source of right buttock/hip pain.  Minimal symptoms at present, intermittent Tylenol for now.  RTC precautions.  Type 2 diabetes mellitus with hyperglycemia, without long-term current use of insulin (HCC)  -Tolerating current regimen, on repeat A1c next visit  Hyperlipidemia, unspecified hyperlipidemia type  -Stable on statin.  Doubt statin because of arthropathies as improved with prednisone.  Polyarthralgia  -Improved with prednisone.  Differential gout flare but less likely with normal uric acid versus osteoarthritis as previously discussed.  Continue allopurinol 200 mg daily, then if return of polyarthralgias may benefit from rheumatology eval.  No orders of the defined types were placed in this encounter.  Patient Instructions   Tylenol if needed for pain -in the hip or back.  Hip pain could be coming from your back.  I will check x-rays and let you know if there are concerns.  Continue allopurinol for gout, metformin for diabetes. If return of multiple joint pains, you will need to see a rheumatologist. Let me know and I can place a referral. Return to the clinic or go to the nearest emergency room if any of your symptoms worsen or new symptoms occur.  Recheck in 6 weeks  ?  au l?ng c?p tnh, ng??i l?n Acute Back Pain, Adult ?au l?ng c?p tnh x?y ra  ??t ng?t v th??ng t?n t?i trong th?i gian ng?n. B?nh th??ng l do t?n th??ng c? ho?c m ? l?ng gy ra. Th??ng t?n c th? do:  M?t c? ho?c dy ch?ng b? ko gin qu m?c ho?c b? rch (b? c?ng). Dy ch?ng l cc m n?i cc x??ng v?i nhau. Nng nh?c ?? v?t khng ?ng cch c th? gy c?ng c? l?ng.  Mn v rch (thoi ha) ??a ??m c?t s?ng. Cc ??a ??m c?t s?ng l m trn t?o thnh ??m gi?a cc x??ng c?a c?t s?ng (??t s?ng).  C? ??ng v?n ng??i, nh? l trong khi ch?i th? thao ho?c lm v??n.  C ?nh vo l?ng.  Vim kh?p. Qu v? c th? ???c khm th?c th?, xt nghi?m v cc ki?m tra hnh ?nh ?? tm ra nguyn nhn gy ?au. ?au l?ng c?p tnh th??ng t? h?t khi ngh? ng?i v ch?m [place] t?i nh. Tun th? nh?ng h??ng d?n ny ? nh: X? tr ?au, c?ng kh?p v s?ng n?  Ch? s? d?ng thu?c khng k ??n v thu?c k ??n theo ch? d?n c?a chuyn gia ch?m [place] s?c kh?e.  Chuyn gia ch?m [place] s?c kh?e c th? khuy?n ngh? ch??m ? l?nh trong 24-48 gi? ??u tin sau khi ?au b?t ??u. ?? lm ?i?u ny: ? Cho ? l?nh vo ti ni lng. ? ?? kh?n t?m ? gi?a da v ti ch??m. ? Ch??m ? l?nh trong 20 pht, 2-3 l?n m?i ngy.  N?u ???c chi? d?n, ha?y ch???m no?ng va?o vu?ng bi? ?au th??ng xuyn theo chi? d?n cu?a chuyn gia ch?m so?c s??c kho?e. S? d?ng ngu?n nhi?t m chuyn gia ch?m  s?c kh?e khuy?n ngh?, ch?ng h?n nh? ti ch??m nhi?t ?m ho?c mi?ng ??m ch??m nng. ? ?? kh?n t?m ? gi?a da v ngu?n nhi?t. ? Duy tr ngu?n nhi?t trong 20-30 pht. ? B? ngu?n nhi?t ra n?u da qu v? chuy?n sang mu ?? nh?t. ?i?u ny ??c bi?t quan tr?ng n?u qu v? khng th? c?m th?y ?au, nng, hay l?nh. Qu v? c nguy c? b? b?ng cao h?n. Ho?t ??ng   Khng n?m nhi?u trn gi??ng. N?m nhi?u h?n 1-2 ngy trn gi??ng c th? la?m ch?m vi?c ph?c h?i c?a quy? vi?.  Ng?i th?ng v ??ng th?ng l?ng. Trnh ng? v? pha tr??c khi qu v? ng?i ho?c cong l?ng khi qu v? ??ng. ? N?u qu v? lm vi?c ? bn, hy ng?i g?n bn ?? qu v? khng c?n ph?i ng? v? pha tr??c.  Gi? cho c?m c?a qu v? g?p vo. Gi? cho c? c?a qu v? ko ra sau v gi? cho khu?u tay g?p m?t gc bn ph?i. Cc cnh tay c?n ph?i trng gi?ng ch? "L." ? Ng?i cao v g?n tay li khi qu v? li xe. ??t thm ??m ? l?ng d??i (th?t l?ng) cho gh? xe  t, n?u c?n.  ?i b? m?t ?o?n ng?n trn b? m?t ph?ng ngay khi qu v? c th?. C? g?ng t?ng th?i gian ?i b? c?a qu v? m?i ngy.  Khng ng?i, la?i xe, ho?c ??ng m?t ch? qu 30 pht m?i l?n. Ng?i ho?c ??ng trong th?i gian di c th? gy p l?c ln l?ng qu v?.  Khng li xe ho?c s? d?ng my mc h?ng n?ng trong khi dng thu?c gi?m ?au k ??n.  S? d?ng cc k? thu?t nng ph h?p. Khi qu v?  ci xu?ng v nng ?? v?t, p d?ng cc t? th? gy t p l?c ln l?ng qu v? h?n: ? Cong ??u g?i. ? Gi?? cho v?t n??ng g?n c? th? quy? vi?. ? Trnh v?n ng??i.  T?p th? d?c th??ng xuyn theo ch? d?n c?a chuyn gia ch?m [place] s?c kh?e. T?p th? d?c gip l?ng c?a qu v? li?n nhanh h?n v gip ng?n ng?a t?n th??ng l?ng b?ng cch gi? cho cc c? b?p ch?c kh?e v linh ho?t.  Lm vi?c v?i m?t chuyn gia v?t l tr? li?u ?? l?p m?t ch??ng trnh t?p luy?n an ton, theo khuy?n ngh? c?a chuyn gia ch?m [place] s?c kh?e. T?p theo b?t c? bi t?p no do bc s? v?t l tr? li?u ch? d?n. L?i s?ng  Duy tr cn n?ng c l?i cho s?c kh?e. Th?a cn gy a?p l??c cho l?ng quy? vi? va? la?m qu v? kh duy tri? t? th? t?t.  Trnh cc ho?t ??ng ho?c cc tnh hu?ng khi?n qu v? c?m th?y lo l?ng ho?c c?ng th?ng. C?ng th?ng v lo u lm c? c?ng h?n v c th? khi?n tnh tr?ng ?au l?ng tr?m tr?ng h?n. H?c cch qu?n l lo u v c?ng th?ng, nh? l qua t?p th? d?c. H??ng d?n chung  Ng? trn n?m c?ng v?i t? th? tho?i mi. C? g?ng n?m nghing v?i ??u g?i h?i cong. N?u quy? vi? n??m ng??a, hy ??t m?t chi?c g?i d??i ??u g?i c?a quy? vi?.  Tun th? k? ho?ch ?i?u tr? c?a qu v? theo ch? d?n c?a chuyn gia ch?m [place] s?c kh?e. Vi?c ny c th? bao g?m: ? Li?u php nh?n th?c ho?c hnh vi. ? Chm c?u ho?c li?u  php xoa bp. ? Thi?n ho?c yoga. Hy lin l?c v?i chuyn gia ch?m [place] s?c kh?e n?u:  Qu v? b? ?au khng thuyn gi?m sau khi nghi? ng?i ho??c dng thu?c.  Quy? vi? bi? ?au t?ng ln, lan xu?ng chn ho?c mng.  ?au khng ?? sau 2 tu?n.  Quy? vi? bi? ?au va?o ban ?m.  Qu v? b? s?t cn m khng c? g??ng gi?m cn.  Qu v? b? s?t ho?c ?n l?nh. Yu c?u tr? gip ngay l?p t?c n?u:  Quy? vi? bi? nh??ng v?n ?? m??i v? ki?m soa?t ?a?i ti?n ho??c ti?u ti?n.  Qu v? b? y?u ho??c t b b?t th???ng ?? cnh tay ho?c chn.  Quy? vi? bu?n nn ho?c nn.  Qu v? b? ?au b?ng.  Quy? vi? ca?m th?y bi? ng?t. Tm t?t  ?au l?ng c?p tnh x?y ra ??t ng?t v th??ng t?n t?i trong th?i gian ng?n.  S? d?ng cc k? thu?t nng ph h?p. Khi qu v? ci xu?ng v nng, p d?ng cc t? th? gy t p l?c ln l?ng c?a qu v?.  S? d?ng thu?c khng k ??n v thu?c k ??n v ch??m nng ho?c ? l?nh theo ch? d?n c?a chuyn gia ch?m [place] s?c kh?e. Thng tin ny khng nh?m m?c ?ch thay th? cho l?i khuyn m chuyn gia ch?m [place] s?c kh?e ni v?i qu v?. Hy b?o ??m qu v? ph?i th?o lu?n b?t k? v?n ?? g m qu v? c v?i chuyn gia ch?m [place] s?c kh?e c?a qu v?. Document Revised: 08/28/2017 Document Reviewed: 11/06/2016 Elsevier Patient Education  [employer].     If you have lab work done today you will be contacted with your lab results within the next 2 weeks.  If you have not  heard from [place] then please contact us. The fastest way to get your results is to register for My Chart.   IF you received an x-ray today, you will receive an invoice from [facility]. Please contact [facility] at [phone] with questions or concerns regarding your invoice.   IF you received labwork today, you will receive an invoice from [place]. Please contact [facility] at [phone] with questions or concerns regarding your invoice.   Our billing staff will not be able to assist you with questions  regarding bills from these companies.  You will be contacted with the lab results as soon as they are available. The fastest way to get your results is to activate your My Chart account. Instructions are located on the last page of this paperwork. If you have not heard from [place] regarding the results in 2 weeks, please contact this office.         Signed, Merri Ray, MD [facility]

## 2019-12-12 NOTE — Patient Instructions (Addendum)
Tylenol if needed for pain -in the hip or back.  Hip pain could be coming from your back.  I will check x-rays and let you know if there are concerns.  Continue allopurinol for gout, metformin for diabetes. If return of multiple joint pains, you will need to see a rheumatologist. Let me know and I can place a referral. Return to the clinic or go to the nearest emergency room if any of your symptoms worsen or new symptoms occur.  Recheck in 6 weeks  ?au l?ng c?p tnh, ng??i l?n Acute Back Pain, Adult ?au l?ng c?p tnh x?y ra ??t ng?t v th??ng t?n t?i trong th?i gian ng?n. B?nh th??ng l do t?n th??ng c? ho?c m ? l?ng gy ra. Th??ng t?n c th? do:  M?t c? ho?c dy ch?ng b? ko gin qu m?c ho?c b? rch (b? c?ng). Dy ch?ng l cc m n?i cc x??ng v?i nhau. Nng nh?c ?? v?t khng ?ng cch c th? gy c?ng c? l?ng.  Mn v rch (thoi ha) ??a ??m c?t s?ng. Cc ??a ??m c?t s?ng l m trn t?o thnh ??m gi?a cc x??ng c?a c?t s?ng (??t s?ng).  C? ??ng v?n ng??i, nh? l trong khi ch?i th? thao ho?c lm v??n.  C ?nh vo l?ng.  Vim kh?p. Qu v? c th? ???c khm th?c th?, xt nghi?m v cc ki?m tra hnh ?nh ?? tm ra nguyn nhn gy ?au. ?au l?ng c?p tnh th??ng t? h?t khi ngh? ng?i v ch?m [place] t?i nh. Tun th? nh?ng h??ng d?n ny ? nh: X? tr ?au, c?ng kh?p v s?ng n?  Ch? s? d?ng thu?c khng k ??n v thu?c k ??n theo ch? d?n c?a chuyn gia ch?m [place] s?c kh?e.  Chuyn gia ch?m [place] s?c kh?e c th? khuy?n ngh? ch??m ? l?nh trong 24-48 gi? ??u tin sau khi ?au b?t ??u. ?? lm ?i?u ny: ? Cho ? l?nh vo ti ni lng. ? ?? kh?n t?m ? gi?a da v ti ch??m. ? Ch??m ? l?nh trong 20 pht, 2-3 l?n m?i ngy.  N?u ???c chi? d?n, ha?y ch???m no?ng va?o vu?ng bi? ?au th??ng xuyn theo chi? d?n cu?a chuyn gia ch?m so?c s??c kho?e. S? d?ng ngu?n nhi?t m chuyn gia ch?m [place] s?c kh?e khuy?n ngh?, ch?ng h?n nh? ti ch??m nhi?t ?m ho?c mi?ng ??m ch??m nng. ? ?? kh?n t?m ? gi?a da v ngu?n  nhi?t. ? Duy tr ngu?n nhi?t trong 20-30 pht. ? B? ngu?n nhi?t ra n?u da qu v? chuy?n sang mu ?? nh?t. ?i?u ny ??c bi?t quan tr?ng n?u qu v? khng th? c?m th?y ?au, nng, hay l?nh. Qu v? c nguy c? b? b?ng cao h?n. Ho?t ??ng   Khng n?m nhi?u trn gi??ng. N?m nhi?u h?n 1-2 ngy trn gi??ng c th? la?m ch?m vi?c ph?c h?i c?a quy? vi?.  Ng?i th?ng v ??ng th?ng l?ng. Trnh ng? v? pha tr??c khi qu v? ng?i ho?c cong l?ng khi qu v? ??ng. ? N?u qu v? lm vi?c ? bn, hy ng?i g?n bn ?? qu v? khng c?n ph?i ng? v? pha tr??c. Gi? cho c?m c?a qu v? g?p vo. Gi? cho c? c?a qu v? ko ra sau v gi? cho khu?u tay g?p m?t gc bn ph?i. Cc cnh tay c?n ph?i trng gi?ng ch? "L." ? Ng?i cao v g?n tay li khi qu v? li xe. ??t thm ??m ? l?ng d??i (th?t l?ng) cho gh? xe  t, n?u c?n.  ?  i b? m?t ?o?n ng?n trn b? m?t ph?ng ngay khi qu v? c th?. C? g?ng t?ng th?i gian ?i b? c?a qu v? m?i ngy.  Khng ng?i, la?i xe, ho?c ??ng m?t ch? qu 30 pht m?i l?n. Ng?i ho?c ??ng trong th?i gian di c th? gy p l?c ln l?ng qu v?.  Khng li xe ho?c s? d?ng my mc h?ng n?ng trong khi dng thu?c gi?m ?au k ??n.  S? d?ng cc k? thu?t nng ph h?p. Khi qu v? ci xu?ng v nng ?? v?t, p d?ng cc t? th? gy t p l?c ln l?ng qu v? h?n: ? Cong ??u g?i. ? Gi?? cho v?t n??ng g?n c? th? quy? vi?. ? Trnh v?n ng??i.  T?p th? d?c th??ng xuyn theo ch? d?n c?a chuyn gia ch?m [place] s?c kh?e. T?p th? d?c gip l?ng c?a qu v? li?n nhanh h?n v gip ng?n ng?a t?n th??ng l?ng b?ng cch gi? cho cc c? b?p ch?c kh?e v linh ho?t.  Lm vi?c v?i m?t chuyn gia v?t l tr? li?u ?? l?p m?t ch??ng trnh t?p luy?n an ton, theo khuy?n ngh? c?a chuyn gia ch?m [place] s?c kh?e. T?p theo b?t c? bi t?p no do bc s? v?t l tr? li?u ch? d?n. L?i s?ng  Duy tr cn n?ng c l?i cho s?c kh?e. Th?a cn gy a?p l??c cho l?ng quy? vi? va? la?m qu v? kh duy tri? t? th? t?t.  Trnh cc ho?t ??ng ho?c cc tnh hu?ng  khi?n qu v? c?m th?y lo l?ng ho?c c?ng th?ng. C?ng th?ng v lo u lm c? c?ng h?n v c th? khi?n tnh tr?ng ?au l?ng tr?m tr?ng h?n. H?c cch qu?n l lo u v c?ng th?ng, nh? l qua t?p th? d?c. H??ng d?n chung  Ng? trn n?m c?ng v?i t? th? tho?i mi. C? g?ng n?m nghing v?i ??u g?i h?i cong. N?u quy? vi? n??m ng??a, hy ??t m?t chi?c g?i d??i ??u g?i c?a quy? vi?.  Tun th? k? ho?ch ?i?u tr? c?a qu v? theo ch? d?n c?a chuyn gia ch?m [place] s?c kh?e. Vi?c ny c th? bao g?m: ? Li?u php nh?n th?c ho?c hnh vi. ? Chm c?u ho?c li?u php xoa bp. ? Thi?n ho?c yoga. Hy lin l?c v?i chuyn gia ch?m [place] s?c kh?e n?u:  Qu v? b? ?au khng thuyn gi?m sau khi nghi? ng?i ho??c dng thu?c.  Quy? vi? bi? ?au t?ng ln, lan xu?ng chn ho?c mng.  ?au khng ?? sau 2 tu?n.  Quy? vi? bi? ?au va?o ban ?m.  Qu v? b? s?t cn m khng c? g??ng gi?m cn.  Qu v? b? s?t ho?c ?n l?nh. Yu c?u tr? gip ngay l?p t?c n?u:  Quy? vi? bi? nh??ng v?n ?? m??i v? ki?m soa?t ?a?i ti?n ho??c ti?u ti?n.  Qu v? b? y?u ho??c t b b?t th???ng ?? cnh tay ho?c chn.  Quy? vi? bu?n nn ho?c nn.  Qu v? b? ?au b?ng.  Quy? vi? ca?m th?y bi? ng?t. Tm t?t  ?au l?ng c?p tnh x?y ra ??t ng?t v th??ng t?n t?i trong th?i gian ng?n.  S? d?ng cc k? thu?t nng ph h?p. Khi qu v? ci xu?ng v nng, p d?ng cc t? th? gy t p l?c ln l?ng c?a qu v?.  S? d?ng thu?c khng k ??n v thu?c k ??n v ch??m nng ho?c ? l?nh theo ch? d?n c?a chuyn gia ch?m [place] s?c kh?e. Thng tin ny khng  nh?m m?c ?ch thay th? cho l?i khuyn m chuyn gia ch?m [place] s?c kh?e ni v?i qu v?. Hy b?o ??m qu v? ph?i th?o lu?n b?t k? v?n ?? g m qu v? c v?i chuyn gia ch?m [place] s?c kh?e c?a qu v?. Document Revised: 08/28/2017 Document Reviewed: 11/06/2016 Elsevier Patient Education  [employer].     If you have lab work done today you will be contacted with your lab results within the next 2 weeks.  If you  have not heard from [place] then please contact us. The fastest way to get your results is to register for My Chart.   IF you received an x-ray today, you will receive an invoice from [facility]. Please contact [facility] at [phone] with questions or concerns regarding your invoice.   IF you received labwork today, you will receive an invoice from [place]. Please contact [facility] at [phone] with questions or concerns regarding your invoice.   Our billing staff will not be able to assist you with questions regarding bills from these companies.  You will be contacted with the lab results as soon as they are available. The fastest way to get your results is to activate your My Chart account. Instructions are located on the last page of this paperwork. If you have not heard from [place] regarding the results in 2 weeks, please contact this office.

## 2019-12-23 ENCOUNTER — Other Ambulatory Visit: Payer: Self-pay | Admitting: Family Medicine

## 2019-12-23 DIAGNOSIS — R066 Hiccough: Secondary | ICD-10-CM

## 2020-01-08 ENCOUNTER — Other Ambulatory Visit: Payer: Self-pay | Admitting: Family Medicine

## 2020-01-08 ENCOUNTER — Other Ambulatory Visit: Payer: Self-pay | Admitting: Physician Assistant

## 2020-01-08 DIAGNOSIS — M109 Gout, unspecified: Secondary | ICD-10-CM

## 2020-01-08 NOTE — Telephone Encounter (Signed)
Requested Prescriptions  Pending Prescriptions Disp Refills  . allopurinol (ZYLOPRIM) 100 MG tablet [Pharmacy Med Name: ALLOPURINOL 100MG  TABLETS] 180 tablet 0    Sig: TAKE 2 TABLETS(200 MG) BY MOUTH DAILY     Endocrinology:  Gout Agents Passed - 01/08/2020  3:42 AM      Passed - Uric Acid in normal range and within 360 days    Uric Acid  Date Value Ref Range Status  10/24/2019 4.5 3.8 - 8.4 mg/dL Final    Comment:               Therapeutic target for gout patients: <6.0         Passed - Cr in normal range and within 360 days    Creat  Date Value Ref Range Status  12/04/2015 1.24 (H) 0.70 - 1.18 mg/dL Final    Comment:      For patients > or = 78 years of age: The upper reference limit for Creatinine is approximately 13% higher for people identified as African-American.      Creatinine, Ser  Date Value Ref Range Status  10/13/2019 1.05 0.76 - 1.27 mg/dL Final         Passed - Valid encounter within last 12 months    Recent Outpatient Visits          3 weeks ago Right hip pain   [facility], Ranell Patrick, MD   2 months ago Hyperlipidemia, unspecified hyperlipidemia type   [facility], Ranell Patrick, MD   2 months ago Polyarthralgia   [facility], Ranell Patrick, MD   3 months ago Rash and nonspecific skin eruption   [facility], Ranell Patrick, MD   7 months ago Rash and nonspecific skin eruption   [facility], Ranell Patrick, MD      Future Appointments            In 1 week Carlota Raspberry Ranell Patrick, MD [facility] [place]   In 1 month Bo Merino, MD [facility]

## 2020-01-09 NOTE — Progress Notes (Signed)
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   History of Present Illness: Reginald Long is a 79 y.o. male seen in consultation per request of Dr. Nyoka Cowden.  According to the patient he moved to [place] in 1996 and in 2006 he started having gout flares.  He states the gout flares were in his foot, ankles and his knee joints.  He was a started on allopurinol and colchicine at the same time.  But it appears he has been taking it on as needed basis for the gout flares.  He reports having gout flare about once in 3 months.  He states the last flare was a month ago which was in his bilateral knee joints.  He states taking allopurinol and colchicine and the symptoms improved.  He states for the last 2 months he has been having pain in his right hip which she describes a started in his lower back and radiates to the right side.  He was seen by Dr. Nyoka Cowden and was at his twice Tylenol.  None of the other joints are painful.  Denies drinking any alcohol.  He does not eat any red meat or seafood.  Activities of Daily Living:  Patient reports morning stiffness for 15  minutes.   Patient Denies nocturnal pain.  Difficulty dressing/grooming: Reports Difficulty climbing stairs: Reports Difficulty getting out of chair: Denies Difficulty using hands for taps, buttons, cutlery, and/or writing: Reports  Review of Systems  Constitutional: Negative for fatigue.  HENT: Negative for mouth sores, mouth dryness and nose dryness.   Eyes: Negative for pain, itching and dryness.  Respiratory: Negative for shortness of breath and difficulty breathing.   Cardiovascular: Negative for chest pain and palpitations.  Gastrointestinal: Positive for constipation. Negative for  blood in stool and diarrhea.  Endocrine: Positive for increased urination.  Genitourinary: Positive for difficulty urinating.  Musculoskeletal: Positive for arthralgias, joint pain and morning stiffness. Negative for joint swelling, myalgias, muscle tenderness and myalgias.  Skin: Negative for color change, rash and redness.  Allergic/Immunologic: Negative for susceptible to infections.  Neurological: Negative for dizziness, numbness, headaches, memory loss and weakness.  Hematological: Negative for bruising/bleeding tendency.  Psychiatric/Behavioral: Negative for confusion.    PMFS History:  Patient Active Problem List   Diagnosis Date Noted  . Acute appendicitis 12/08/2016  . Non-English speaking patient [name] 12/08/2016  . Right renal mass 12/08/2016  . CKD (chronic kidney disease), stage III ([place]) 12/08/2016  . PAF (paroxysmal atrial fibrillation) ([place]) 12/08/2016  . Acute gangrenous appendicitis with perforation and peritonitis 12/08/2016  . Viral enteritis 12/04/2015  . Hyperlipidemia 12/04/2015  . Essential hypertension 12/04/2015  . Right foot pain 12/04/2015  . Acute gout of right ankle 12/04/2015  . Melanoma of skin ([place]) 10/26/2013    Past Medical History:  Diagnosis Date  . Gout   . Hyperlipidemia   . Hypertension   . PAF (paroxysmal atrial fibrillation) ([place]) 12/08/2016    History reviewed. No pertinent family history. Past Surgical History:  Procedure Laterality Date  . LAPAROSCOPIC APPENDECTOMY N/A 12/08/2016   Procedure: APPENDECTOMY LAPAROSCOPIC;  Surgeon: [name], MD;  Location: [facility];  Service: General;  Laterality: N/A;  . right thumb surgery     Social History   Social History Narrative   Originally from [place]   Immunization History  Administered Date(s) Administered  . Influenza,inj,Quad PF,6+ Mos 12/04/2015  . Influenza-Unspecified 11/21/2019  . PFIZER SARS-COV-2 Vaccination 04/05/2019, 05/09/2019, 12/19/2019  . Pneumococcal  Conjugate-13 01/20/2018     Objective: Vital Signs: BP 135/73 (BP Location: Right Arm, Patient Position: Sitting, Cuff Size: Normal)   Pulse 74   Resp 14   Ht _0  (1.575 m)   Wt 130 lb (59 kg)   BMI 23.78 kg/m    Physical Exam Vitals and nursing note reviewed.  Constitutional:      Appearance: He is well-developed and well-nourished.  HENT:     Head: Normocephalic and atraumatic.  Eyes:     Extraocular Movements: EOM normal.     Conjunctiva/sclera: Conjunctivae normal.     Pupils: Pupils are equal, round, and reactive to light.  Cardiovascular:     Rate and Rhythm: Normal rate and regular rhythm.     Heart sounds: Normal heart sounds.  Pulmonary:     Effort: Pulmonary effort is normal.     Breath sounds: Normal breath sounds.  Abdominal:     General: Bowel sounds are normal.     Palpations: Abdomen is soft.  Musculoskeletal:     Cervical back: Normal range of motion and neck supple.  Skin:    General: Skin is warm and dry.     Capillary Refill: Capillary refill takes less than 2 seconds.  Neurological:     Mental Status: He is alert and oriented to person, place, and time.  Psychiatric:        Mood and Affect: Mood and affect normal.        Behavior: Behavior normal.      Musculoskeletal Exam: C-spine and thoracic spine with good range of motion.  He has some discomfort range of motion of his lower lumbar region.  Shoulder joints, elbow joints, wrist joints, MCPs PIPs and DIPs with good range of motion with no synovitis.  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  Hip joints, knee joints, ankles with good range of motion with no synovitis.  There was no tenderness over MTPs.  CDAI Exam: CDAI Score: -- Patient Global: --; Provider Global: -- Swollen: --; Tender: -- Joint Exam 01/18/2020   No joint exam has been documented for this visit   There is currently no information documented on the homunculus. Go to the Rheumatology activity and complete the  homunculus joint exam.  Investigation: No additional findings.  Imaging: DG Knee Complete 4 Views Right  Result Date: 01/16/2020 CLINICAL DATA:  Recurrent pain EXAM: RIGHT KNEE - COMPLETE 4+ VIEW COMPARISON:  None. FINDINGS: Alignment is anatomic. No acute fracture. A small joint effusion is present. Mild marginal spurring. No substantial joint space narrowing. IMPRESSION: Mild degenerative changes. Small joint effusion. Electronically Signed   By: Macy Mis M.D.   On: 01/16/2020 16:49    Recent Labs: Lab Results  Component Value Date   WBC 6.8 06/01/2017   HGB 14.9 06/01/2017   PLT 195 06/01/2017   NA 138 01/16/2020   K 4.6 01/16/2020   CL 101 01/16/2020   CO2 22 01/16/2020   GLUCOSE 272 (H) 01/16/2020   BUN 29 (H) 01/16/2020   CREATININE 1.27 01/16/2020   BILITOT 0.3 10/13/2019   ALKPHOS 115 10/13/2019   AST 23 10/13/2019  ALT 20 10/13/2019   PROT 6.9 10/13/2019   ALBUMIN 4.1 10/13/2019   CALCIUM 9.6 01/16/2020   GFRAA 62 01/16/2020  IMPRESSION: 1. Negative for age radiographic appearance of the right hip and pelvis. 2. Chronic lumbar disc and endplate degeneration.   Electronically Signed   By: Genevie Ann M.D.   On: 12/12/2019 16:10   IMPRESSION: 1. No acute osseous abnormality identified in the lumbar spine. 2. Diffuse chronic lumbar disc and endplate degeneration.   Electronically Signed   By: Genevie Ann M.D.   On: 12/12/2019 16:13  Speciality Comments: No specialty comments available.  Procedures:  No procedures performed Allergies: Patient has no known allergies.   Assessment / Plan:     Visit Diagnoses: Idiopathic chronic gout of multiple sites without tophus - Uric acid 4.5 on 10/24/19.  The most recent uric acid from January 16, 2020 was 8.2.  I had detailed discussion with the patient in front of the interpreter.  Patient takes allopurinol and colchicine on as needed basis.  We discussed taking allopurinol on a daily basis.  I have increased  his dose of allopurinol to 300 mg p.o. daily.  He will finish the tablets he has at home first and then we will call in a new prescription for allopurinol 300 mg tablets so he can take 1 tablet daily.  This was also explained to him clearly.  He will take colchicine 1 tablet daily.  If he has a flare of his symptoms then he will take it twice a day.  Allopurinol 200 mg daily, colchicine BID PRN,   Medication monitoring encounter - Plan: HLA-B*58:01 Typing, CBC with Differential/Platelet.  He had recent BMP and LFTs.  DDD (degenerative disc disease), lumbar -  lumbar spine x-rays was reviewed which was consistent with degenerative changes.  Findings were discussed with the patient.  He has been experiencing lower back pain radiating to the right hip.  He will benefit from physical therapy.  If he has an adequate response to physical therapy then further evaluation can be done.  Pain in right hip -he had good range of motion of his right hip joint.  He has some tenderness over right piriformis.  I believe the hip pain is coming from his lower back.  X-ray from November 2021 was unremarkable.   Essential hypertension-his blood pressure is well controlled.  PAF (paroxysmal atrial fibrillation) ([facility])   Elevated blood glucose-he is on Metformin.  Acute gangrenous appendicitis with perforation and peritonitis  Melanoma of skin ([facility])  Stage 3 chronic kidney disease, unspecified whether stage 3a or 3b CKD ([place])  Right renal mass  History of hyperlipidemia  Non-English speaking patient ([place])-interpreter was present during the conversation.  Orders: Orders Placed This Encounter  Procedures  . HLA-B*58:01 Typing  . CBC with Differential/Platelet   No orders of the defined types were placed in this encounter.   Face-to-face time spent with patient was 48 minutes. Greater than 50% of time was spent in counseling and coordination of care.  Follow-Up Instructions: Return for  Gout.   Bo Merino, MD  Note - This record has been created using Editor, commissioning.  Chart creation errors have been sought, but may not always  have been located. Such creation errors do not reflect on  the standard of medical care.

## 2020-01-16 ENCOUNTER — Ambulatory Visit (INDEPENDENT_AMBULATORY_CARE_PROVIDER_SITE_OTHER): Payer: Medicare HMO

## 2020-01-16 ENCOUNTER — Other Ambulatory Visit: Payer: Self-pay

## 2020-01-16 ENCOUNTER — Encounter: Payer: Self-pay | Admitting: Family Medicine

## 2020-01-16 ENCOUNTER — Ambulatory Visit (INDEPENDENT_AMBULATORY_CARE_PROVIDER_SITE_OTHER): Payer: Medicare HMO | Admitting: Family Medicine

## 2020-01-16 VITALS — BP 94/57 | HR 74 | Temp 98.2°F | Ht 62.0 in | Wt 128.0 lb

## 2020-01-16 DIAGNOSIS — E1165 Type 2 diabetes mellitus with hyperglycemia: Secondary | ICD-10-CM | POA: Diagnosis not present

## 2020-01-16 DIAGNOSIS — M25561 Pain in right knee: Secondary | ICD-10-CM

## 2020-01-16 DIAGNOSIS — M25551 Pain in right hip: Secondary | ICD-10-CM | POA: Diagnosis not present

## 2020-01-16 DIAGNOSIS — M2550 Pain in unspecified joint: Secondary | ICD-10-CM | POA: Diagnosis not present

## 2020-01-16 DIAGNOSIS — M25571 Pain in right ankle and joints of right foot: Secondary | ICD-10-CM

## 2020-01-16 DIAGNOSIS — I10 Essential (primary) hypertension: Secondary | ICD-10-CM

## 2020-01-16 DIAGNOSIS — M109 Gout, unspecified: Secondary | ICD-10-CM | POA: Diagnosis not present

## 2020-01-16 DIAGNOSIS — M25461 Effusion, right knee: Secondary | ICD-10-CM | POA: Diagnosis not present

## 2020-01-16 MED ORDER — LISINOPRIL 20 MG PO TABS: ORAL_TABLET | ORAL | 1 refills | Status: DC

## 2020-01-16 MED ORDER — AMLODIPINE BESYLATE 5 MG PO TABS: ORAL_TABLET | ORAL | 1 refills | Status: DC

## 2020-01-16 NOTE — Progress Notes (Signed)
Subjective:  Patient ID: Reginald Long, male    DOB: 01-Nov-1940  Age: 79 y.o. MRN: [id number]  CC:  Chief Complaint  Patient presents with  . Follow-up    On diabetes,hypertension, and lab work. PT reports pain that was reported in his R hip last OV has improved a little, but is still there in his R hip, R knee, and R foot. Pt hasn't had any issues with BP since last OV. Pt reports no issues with BS since last OV. Pt would like provider to go over recent blood work and x-ray results from last OV if possible.   Offered video interpreter. Declined - granddaughter interpreting.   HPI Garreth Burnsworth presents for   Right hip pain Discussed office visit November 8.  Possible lumbar source.  Minimal symptoms, intermittent Tylenol discussed.  Diffuse chronic lumbar disc and endplate degeneration noted on imaging.  Symptoms have improved, still minimal discomfort.   Polyarthralgia: With hx of gout. Discussed in November. OA vs gout with prior knee and wrist pains.  Taking allopurinol 266m qd.  Started back with R knee and foot pain - R ankle. Past week. Requesting prednisone again - last rx in October.  No joint swelling. No redness.  No pain meds.  Referred to rheumatology in July. Has not seen.  Pain in knee and ankle comes and goes, not persistent at this time.  Soreness under foot or to heel at times, not painful now.  No recent ortho eval.   Lab Results  Component Value Date   LABURIC 8.2 01/16/2020   Hypertension: Amlodipine 5 mg daily, lisinopril 20 mg daily. Reports compliance with med daily. History of CKD. Borderline elevated last visit, now today.  Denies lightheadedness/dizziness.  Home readings: none recently.  BP Readings from Last 3 Encounters:  01/16/20 (!) 94/57  12/12/19 (!) 144/68  11/26/19 118/67   Lab Results  Component Value Date   CREATININE 1.27 01/16/2020   Diabetes:  Stable 3 months ago.  Treated with Metformin 500 mg twice daily.  He is on ACE inhibitor and  statin. Microalbumin: Normal ratio April 23 Optho, foot exam, pneumovax: Pneumovax deferred.  Lab Results  Component Value Date   HGBA1C 10.1 (H) 01/16/2020   HGBA1C 6.5 (H) 10/13/2019   HGBA1C 6.1 (H) 05/27/2019   Lab Results  Component Value Date   LDLCALC 91 10/24/2019   CREATININE 1.27 01/16/2020       History Patient Active Problem List   Diagnosis Date Noted  . Acute appendicitis 12/08/2016  . Non-English speaking patient (Verner Chol 12/08/2016  . Right renal mass 12/08/2016  . CKD (chronic kidney disease), stage III ([place] 12/08/2016  . PAF (paroxysmal atrial fibrillation) ([place] 12/08/2016  . Acute gangrenous appendicitis with perforation and peritonitis 12/08/2016  . Viral enteritis 12/04/2015  . Hyperlipidemia 12/04/2015  . Essential hypertension 12/04/2015  . Right foot pain 12/04/2015  . Acute gout of right ankle 12/04/2015  . [place]oma of skin ([place] 10/26/2013   Past Medical History:  Diagnosis Date  . Gout   . Hyperlipidemia   . Hypertension   . PAF (paroxysmal atrial fibrillation) ([place] 12/08/2016   Past Surgical History:  Procedure Laterality Date  . LAPAROSCOPIC APPENDECTOMY N/A 12/08/2016   Procedure: APPENDECTOMY LAPAROSCOPIC;  Surgeon: GMichael Boston MD;  Location: [facility];  Service: General;  Laterality: N/A;  . right thumb surgery     No Known Allergies Prior to Admission medications   Medication Sig Start Date End Date Taking? Authorizing Provider  allopurinol (  ZYLOPRIM) 100 MG tablet TAKE 2 TABLETS(200 MG) BY MOUTH DAILY 01/08/20  Yes Wendie Agreste, MD  amLODipine (NORVASC) 5 MG tablet TAKE 1 TABLET(5 MG) BY MOUTH DAILY 11/24/19  Yes Wendie Agreste, MD  atorvastatin (LIPITOR) 10 MG tablet TAKE 1 TABLET(10 MG) BY MOUTH DAILY AT 6 PM 11/25/19  Yes Wendie Agreste, MD  baclofen (LIORESAL) 10 MG tablet Take 1 tablet (10 mg total) by mouth 3 (three) times daily as needed (for hiccups.). 10/13/19  Yes Wendie Agreste, MD  blood glucose meter  kit and supplies Dispense based on patient and insurance preference.  Once per day - fasting or 2 hours after meal. 01/03/19  Yes Wendie Agreste, MD  cetirizine (ZYRTEC ALLERGY) 10 MG tablet Take 1 tablet (10 mg total) by mouth daily. 11/26/19  Yes Volney American, PA-C  colchicine 0.6 MG tablet 2 at onset of gout, 1 tab in 1 hour if needed. 05/27/19  Yes Wendie Agreste, MD  lisinopril (ZESTRIL) 20 MG tablet TAKE 1 TABLET(20 MG) BY MOUTH DAILY 11/14/19  Yes Wendie Agreste, MD  metFORMIN (GLUCOPHAGE) 500 MG tablet TAKE 1 TABLET(500 MG) BY MOUTH TWICE DAILY WITH A MEAL 11/24/19  Yes Wendie Agreste, MD  omeprazole (PRILOSEC) 20 MG capsule TAKE 1 CAPSULE(20 MG) BY MOUTH DAILY 12/23/19  Yes Wendie Agreste, MD  [facility] ULTRA test strip USE AS DIRECTED DAILY 01/31/19  Yes Wendie Agreste, MD  predniSONE (DELTASONE) 20 MG tablet Take 2 tablets daily with breakfast. 11/26/19  Yes Volney American, PA-C  triamcinolone cream (KENALOG) 0.1 % Apply 1 application topically 3 (three) times daily as needed. 05/27/19  Yes Wendie Agreste, MD  triamcinolone cream (KENALOG) 0.1 % Apply 1 application topically 2 (two) times daily. 09/14/19  Yes Wendie Agreste, MD   Social History   Socioeconomic History  . Marital status: Married    Spouse name: Not on file  . Number of children: Not on file  . Years of education: Not on file  . Highest education level: Not on file  Occupational History  . Not on file  Tobacco Use  . Smoking status: Former Research scientist (life sciences)  . Smokeless tobacco: Never Used  Substance and Sexual Activity  . Alcohol use: No  . Drug use: No  . Sexual activity: Not on file  Other Topics Concern  . Not on file  Social History Narrative   Originally from [place]   Social Determinants of Health   Financial Resource Strain: Not on [employer] Insecurity: Not on file  Transportation Needs: Not on file  Physical Activity: Not on file  Stress: Not on file  Social  Connections: Not on file  Intimate Partner Violence: Not on file    Review of Systems  Constitutional: Negative for fatigue and unexpected weight change.  Eyes: Negative for visual disturbance.  Respiratory: Negative for cough, chest tightness and shortness of breath.   Cardiovascular: Negative for chest pain, palpitations and leg swelling.  Gastrointestinal: Negative for abdominal pain and blood in stool.  Musculoskeletal: Positive for arthralgias.  Neurological: Negative for dizziness, weakness, light-headedness and headaches.     Objective:   Vitals:   01/16/20 1454  BP: (!) 94/57  Pulse: 74  Temp: 98.2 F (36.8 C)  TempSrc: Temporal  SpO2: 100%  Weight: 128 lb (58.1 kg)  Height: 5' 2" (1.575 m)   Orthostatic VS for the past 24 hrs (Last 3 readings):  BP- Lying Pulse- Lying BP-  Sitting Pulse- Sitting BP- Standing at 0 minutes Pulse- Standing at 0 minutes BP- Standing at 3 minutes Pulse- Standing at 3 minutes  01/16/20 1622 112/60 59 100/66 60 110/70 65 122/66 63     Physical Exam Vitals reviewed.  Constitutional:      Appearance: He is well-developed and well-nourished.  HENT:     Head: Normocephalic and atraumatic.  Eyes:     Extraocular Movements: EOM normal.     Pupils: Pupils are equal, round, and reactive to light.  Neck:     Vascular: No carotid bruit or JVD.  Cardiovascular:     Rate and Rhythm: Normal rate and regular rhythm.     Heart sounds: Normal heart sounds. No murmur heard.   Pulmonary:     Effort: Pulmonary effort is normal.     Breath sounds: Normal breath sounds. No rales.  Musculoskeletal:        General: No edema.     Comments: R knee from, bony prominence medial no pain with exam at present.   Skin:    General: Skin is warm and dry.  Neurological:     Mental Status: He is alert and oriented to person, place, and time.  Psychiatric:        Mood and Affect: Mood and affect normal.     DG Knee Complete 4 Views Right  Result Date:  01/16/2020 CLINICAL DATA:  Recurrent pain EXAM: RIGHT KNEE - COMPLETE 4+ VIEW COMPARISON:  None. FINDINGS: Alignment is anatomic. No acute fracture. A small joint effusion is present. Mild marginal spurring. No substantial joint space narrowing. IMPRESSION: Mild degenerative changes. Small joint effusion. Electronically Signed   By: Macy Mis M.D.   On: 01/16/2020 16:49   Orthostatic VS for the past 24 hrs (Last 3 readings):  BP- Lying Pulse- Lying BP- Sitting Pulse- Sitting BP- Standing at 0 minutes Pulse- Standing at 0 minutes BP- Standing at 3 minutes Pulse- Standing at 3 minutes  01/16/20 1622 112/60 59 100/66 60 110/70 65 122/66 63    35 minutes spent during visit, greater than 50% counseling and assimilation of information, chart review, and discussion of plan.   DG Knee Complete 4 Views Right  Result Date: 01/16/2020 CLINICAL DATA:  Recurrent pain EXAM: RIGHT KNEE - COMPLETE 4+ VIEW COMPARISON:  None. FINDINGS: Alignment is anatomic. No acute fracture. A small joint effusion is present. Mild marginal spurring. No substantial joint space narrowing. IMPRESSION: Mild degenerative changes. Small joint effusion. Electronically Signed   By: Macy Mis M.D.   On: 01/16/2020 16:49   Results for orders placed or performed in visit on 01/16/20  Hemoglobin A1c  Result Value Ref Range   Hgb A1c MFr Bld 10.1 (H) 4.8 - 5.6 %   Est. average glucose Bld gHb Est-mCnc 243 mg/dL  Basic metabolic panel  Result Value Ref Range   Glucose 272 (H) 65 - 99 mg/dL   BUN 29 (H) 8 - 27 mg/dL   Creatinine, Ser 1.27 0.76 - 1.27 mg/dL   GFR calc non Af Amer 54 (L) >59 mL/min/1.73   GFR calc Af Amer 62 >59 mL/min/1.73   BUN/Creatinine Ratio 23 10 - 24   Sodium 138 134 - 144 mmol/L   Potassium 4.6 3.5 - 5.2 mmol/L   Chloride 101 96 - 106 mmol/L   CO2 22 20 - 29 mmol/L   Calcium 9.6 8.6 - 10.2 mg/dL  Uric Acid  Result Value Ref Range   Uric Acid 8.2 3.8 -  8.4 mg/dL     Assessment & Plan:  Chas Axel is a 79 y.o. male . Polyarthralgia - Plan: Uric Acid Right hip pain Pain in joint involving right ankle and footRecurrent pain of right knee - Plan: DG Knee Complete 4 Views Right Gout of multiple sites, unspecified cause, unspecified chronicity - Plan: Uric Acid  -Hip pain improved. Likely compounded degenerative disc disease of lumbar spine. Tylenol discussed. RTC precautions, possible back specialist/Ortho follow-up as needed.  -Other arthralgias potentially could be osteoarthritis versus gout with prior flairs improved with prednisone.  Uric acid slightly elevated, but current symptoms with intermittent discomfort more likely osteoarthritis. Previously referred to rheumatology to discuss further, phone number provided to schedule visit. continue allopurinol for now, also discussed orthopedic follow-up for possible injection in knee if persistent symptoms -imaging reviewed as above.  -. Intermittent foot pain, and based on location may be plantar fasciitis but minimal symptoms at present. Tylenol as above with RTC precautions.  Type 2 diabetes mellitus with hyperglycemia, without long-term current use of insulin ([place]) - Plan: Hemoglobin A1c  -Significant worsening control -labs noted after office visit. Question of medication adherence versus previous prednisone impact. If compliant with Metformin, will increase to 1000 mg twice daily with office visit in 6 weeks to review further changes. ER/RTC precautions  Essential hypertension - Plan: Basic metabolic panel, Orthostatic vital signs, lisinopril (ZESTRIL) 20 MG tablet, amLODipine (NORVASC) 5 MG tablet  -Labs as above, stable control, no med changes for now   Meds ordered this encounter  Medications  . lisinopril (ZESTRIL) 20 MG tablet    Sig: TAKE 1 TABLET(20 MG) BY MOUTH DAILY    Dispense:  90 tablet    Refill:  1  . amLODipine (NORVASC) 5 MG tablet    Sig: TAKE 1 TABLET(5 MG) BY MOUTH DAILY    Dispense:  90 tablet    Refill:  1    Patient Instructions    Hip pain may be from arthritis in back. Tylenol is ok, but if pain not improved I do recommend meeting with a back specialist. Let me know and I am happy to refer you.   For joint pains, I would like you to meet with rheumatology to decide if this is gout. We referred you in July. Try calling this office for appointment, but let me know if they need a new referral.   Ok to take tylenol up to 4 times per day for joint pains if needed. If knee pain continues, I would recommend meeting with orthopaedic doctor for possible injection.   Return to the clinic or go to the nearest emergency room if any of your symptoms worsen or new symptoms occur.   If you have lab work done today you will be contacted with your lab results within the next 2 weeks.  If you have not heard from [place] then please contact us. The fastest way to get your results is to register for My Chart.   IF you received an x-ray today, you will receive an invoice from [facility]. Please contact [facility] at [phone] with questions or concerns regarding your invoice.   IF you received labwork today, you will receive an invoice from [place] [facility]. Please contact [facility] at [phone] with questions or concerns regarding your invoice.   Our billing staff will not be able to assist you with questions regarding bills from these companies.  You will be contacted with the lab results as soon as they are available. The  fastest way to get your results is to activate your My Chart account. Instructions are located on the last page of this paperwork. If you have not heard from [place] regarding the results in 2 weeks, please contact this office.         Signed, Merri Ray, MD [facility]

## 2020-01-16 NOTE — Patient Instructions (Addendum)
  Hip pain may be from arthritis in back. Tylenol is ok, but if pain not improved I do recommend meeting with a back specialist. Let me know and I am happy to refer you.   For joint pains, I would like you to meet with rheumatology to decide if this is gout. We referred you in July. Try calling this office for appointment, but let me know if they need a new referral.   Ok to take tylenol up to 4 times per day for joint pains if needed. If knee pain continues, I would recommend meeting with orthopaedic doctor for possible injection.   Return to the clinic or go to the nearest emergency room if any of your symptoms worsen or new symptoms occur.   If you have lab work done today you will be contacted with your lab results within the next 2 weeks.  If you have not heard from [place] then please contact us. The fastest way to get your results is to register for My Chart.   IF you received an x-ray today, you will receive an invoice from [facility]. Please contact [facility] at [phone] with questions or concerns regarding your invoice.   IF you received labwork today, you will receive an invoice from [facility]. Please contact [facility] at [phone] with questions or concerns regarding your invoice.   Our billing staff will not be able to assist you with questions regarding bills from these companies.  You will be contacted with the lab results as soon as they are available. The fastest way to get your results is to activate your My Chart account. Instructions are located on the last page of this paperwork. If you have not heard from [place] regarding the results in 2 weeks, please contact this office.

## 2020-01-17 ENCOUNTER — Encounter: Payer: Self-pay | Admitting: Family Medicine

## 2020-01-17 LAB — BASIC METABOLIC PANEL
BUN/Creatinine Ratio: 23 (ref 10–24)
BUN: 29 mg/dL — ABNORMAL HIGH (ref 8–27)
CO2: 22 mmol/L (ref 20–29)
Calcium: 9.6 mg/dL (ref 8.6–10.2)
Chloride: 101 mmol/L (ref 96–106)
Creatinine, Ser: 1.27 mg/dL (ref 0.76–1.27)
GFR calc Af Amer: 62 mL/min/{1.73_m2} (ref >59)
GFR calc non Af Amer: 54 mL/min/{1.73_m2} — ABNORMAL LOW (ref >59)
Glucose: 272 mg/dL — ABNORMAL HIGH (ref 65–99)
Potassium: 4.6 mmol/L (ref 3.5–5.2)
Sodium: 138 mmol/L (ref 134–144)

## 2020-01-17 LAB — URIC ACID: Uric Acid: 8.2 mg/dL (ref 3.8–8.4)

## 2020-01-17 LAB — HEMOGLOBIN A1C
Est. average glucose Bld gHb Est-mCnc: 243 mg/dL
Hgb A1c MFr Bld: 10.1 % — ABNORMAL HIGH (ref 4.8–5.6)

## 2020-01-18 ENCOUNTER — Ambulatory Visit (INDEPENDENT_AMBULATORY_CARE_PROVIDER_SITE_OTHER): Payer: Medicare HMO | Admitting: Rheumatology

## 2020-01-18 ENCOUNTER — Encounter: Payer: Self-pay | Admitting: Rheumatology

## 2020-01-18 ENCOUNTER — Other Ambulatory Visit: Payer: Self-pay

## 2020-01-18 ENCOUNTER — Ambulatory Visit: Payer: Self-pay

## 2020-01-18 VITALS — BP 135/73 | HR 74 | Resp 14 | Ht 62.0 in | Wt 130.0 lb

## 2020-01-18 DIAGNOSIS — N2889 Other specified disorders of kidney and ureter: Secondary | ICD-10-CM | POA: Diagnosis not present

## 2020-01-18 DIAGNOSIS — N183 Chronic kidney disease, stage 3 unspecified: Secondary | ICD-10-CM | POA: Diagnosis not present

## 2020-01-18 DIAGNOSIS — I480 Paroxysmal atrial fibrillation: Secondary | ICD-10-CM

## 2020-01-18 DIAGNOSIS — C439 Malignant melanoma of skin, unspecified: Secondary | ICD-10-CM | POA: Diagnosis not present

## 2020-01-18 DIAGNOSIS — K3532 Acute appendicitis with perforation and localized peritonitis, without abscess: Secondary | ICD-10-CM | POA: Diagnosis not present

## 2020-01-18 DIAGNOSIS — M25551 Pain in right hip: Secondary | ICD-10-CM

## 2020-01-18 DIAGNOSIS — M5136 Other intervertebral disc degeneration, lumbar region: Secondary | ICD-10-CM | POA: Diagnosis not present

## 2020-01-18 DIAGNOSIS — Z5181 Encounter for therapeutic drug level monitoring: Secondary | ICD-10-CM | POA: Diagnosis not present

## 2020-01-18 DIAGNOSIS — I10 Essential (primary) hypertension: Secondary | ICD-10-CM

## 2020-01-18 DIAGNOSIS — M1A09X0 Idiopathic chronic gout, multiple sites, without tophus (tophi): Secondary | ICD-10-CM | POA: Diagnosis not present

## 2020-01-18 DIAGNOSIS — Z789 Other specified health status: Secondary | ICD-10-CM

## 2020-01-18 DIAGNOSIS — Z8639 Personal history of other endocrine, nutritional and metabolic disease: Secondary | ICD-10-CM

## 2020-01-18 DIAGNOSIS — R7309 Other abnormal glucose: Secondary | ICD-10-CM

## 2020-01-18 NOTE — Patient Instructions (Addendum)
Please take colchicine 0.6 mg 1 tablet daily.  If you have gout flare then take colchicine 1 tablet twice a day.  We will change allopurinol dose to 300 mg daily.  Please take allopurinol 100 mg tablet, 3 tablets daily until you finish and then call us for a new prescription of allopurinol.   Your new prescription of allopurinol will be 300 mg tablet and you will take only 1 tablet daily.  Please do not stop allopurinol and continue to take it even if you have gout flare.  Return for blood work in 1 month which will include CBC with differential, CMP with GFR and uric acid.  Do not drink any alcohol, avoid all red meat and seafood.   Your uric acid level should be below 6.0   Back Exercises The following exercises strengthen the muscles that help to support the trunk and back. They also help to keep the lower back flexible. Doing these exercises can help to prevent back pain or lessen existing pain.  If you have back pain or discomfort, try doing these exercises 2-3 times each day or as told by your health care provider.  As your pain improves, do them once each day, but increase the number of times that you repeat the steps for each exercise (do more repetitions).  To prevent the recurrence of back pain, continue to do these exercises once each day or as told by your health care provider. Do exercises exactly as told by your health care provider and adjust them as directed. It is normal to feel mild stretching, pulling, tightness, or discomfort as you do these exercises, but you should stop right away if you feel sudden pain or your pain gets worse. Exercises Single knee to chest Repeat these steps 3-5 times for each leg: 1. Lie on your back on a firm bed or the floor with your legs extended. 2. Bring one knee to your chest. Your other leg should stay extended and in contact with the floor. 3. Hold your knee in place by grabbing your knee or thigh with both hands and hold. 4. Pull on  your knee until you feel a gentle stretch in your lower back or buttocks. 5. Hold the stretch for 10-30 seconds. 6. Slowly release and straighten your leg. Pelvic tilt Repeat these steps 5-10 times: 1. Lie on your back on a firm bed or the floor with your legs extended. 2. Bend your knees so they are pointing toward the ceiling and your feet are flat on the floor. 3. Tighten your lower abdominal muscles to press your lower back against the floor. This motion will tilt your pelvis so your tailbone points up toward the ceiling instead of pointing to your feet or the floor. 4. With gentle tension and even breathing, hold this position for 5-10 seconds. Cat-cow Repeat these steps until your lower back becomes more flexible: 1. Get into a hands-and-knees position on a firm surface. Keep your hands under your shoulders, and keep your knees under your hips. You may place padding under your knees for comfort. 2. Let your head hang down toward your chest. Contract your abdominal muscles and point your tailbone toward the floor so your lower back becomes rounded like the back of a cat. 3. Hold this position for 5 seconds. 4. Slowly lift your head, let your abdominal muscles relax and point your tailbone up toward the ceiling so your back forms a sagging arch like the back of a cow. 5. Hold  this position for 5 seconds.  Press-ups Repeat these steps 5-10 times: 1. Lie on your abdomen (face-down) on the floor. 2. Place your palms near your head, about shoulder-width apart. 3. Keeping your back as relaxed as possible and keeping your hips on the floor, slowly straighten your arms to raise the top half of your body and lift your shoulders. Do not use your back muscles to raise your upper torso. You may adjust the placement of your hands to make yourself more comfortable. 4. Hold this position for 5 seconds while you keep your back relaxed. 5. Slowly return to lying flat on the floor.  Bridges Repeat these  steps 10 times: 1. Lie on your back on a firm surface. 2. Bend your knees so they are pointing toward the ceiling and your feet are flat on the floor. Your arms should be flat at your sides, next to your body. 3. Tighten your buttocks muscles and lift your buttocks off the floor until your waist is at almost the same height as your knees. You should feel the muscles working in your buttocks and the back of your thighs. If you do not feel these muscles, slide your feet 1-2 inches farther away from your buttocks. 4. Hold this position for 3-5 seconds. 5. Slowly lower your hips to the starting position, and allow your buttocks muscles to relax completely. If this exercise is too easy, try doing it with your arms crossed over your chest. Abdominal crunches Repeat these steps 5-10 times: 1. Lie on your back on a firm bed or the floor with your legs extended. 2. Bend your knees so they are pointing toward the ceiling and your feet are flat on the floor. 3. Cross your arms over your chest. 4. Tip your chin slightly toward your chest without bending your neck. 5. Tighten your abdominal muscles and slowly raise your trunk (torso) high enough to lift your shoulder blades a tiny bit off the floor. Avoid raising your torso higher than that because it can put too much stress on your low back and does not help to strengthen your abdominal muscles. 6. Slowly return to your starting position. Back lifts Repeat these steps 5-10 times: 1. Lie on your abdomen (face-down) with your arms at your sides, and rest your forehead on the floor. 2. Tighten the muscles in your legs and your buttocks. 3. Slowly lift your chest off the floor while you keep your hips pressed to the floor. Keep the back of your head in line with the curve in your back. Your eyes should be looking at the floor. 4. Hold this position for 3-5 seconds. 5. Slowly return to your starting position. Contact a health care provider if:  Your back pain  or discomfort gets much worse when you do an exercise.  Your worsening back pain or discomfort does not lessen within 2 hours after you exercise. If you have any of these problems, stop doing these exercises right away. Do not do them again unless your health care provider says that you can. Get help right away if:  You develop sudden, severe back pain. If this happens, stop doing the exercises right away. Do not do them again unless your health care provider says that you can. This information is not intended to replace advice given to you by your health care provider. Make sure you discuss any questions you have with your health care provider. Document Revised: 05/27/2018 Document Reviewed: 10/22/2017 Elsevier Patient Education  2020 [employer]  Inc.  

## 2020-01-23 ENCOUNTER — Encounter: Payer: Self-pay | Admitting: Radiology

## 2020-01-23 LAB — CBC WITH DIFFERENTIAL/PLATELET
Absolute Monocytes: 502 cells/uL (ref 200–950)
Basophils Absolute: 41 cells/uL (ref 0–200)
Basophils Relative: 0.5 %
Eosinophils Absolute: 1199 cells/uL — ABNORMAL HIGH (ref 15–500)
Eosinophils Relative: 14.8 %
HCT: 42.4 % (ref 38.5–50.0)
Hemoglobin: 13.7 g/dL (ref 13.2–17.1)
Lymphs Abs: 2090 cells/uL (ref 850–3900)
MCH: 29.3 pg (ref 27.0–33.0)
MCHC: 32.3 g/dL (ref 32.0–36.0)
MCV: 90.6 fL (ref 80.0–100.0)
MPV: 10.5 fL (ref 7.5–12.5)
Monocytes Relative: 6.2 %
Neutro Abs: 4269 cells/uL (ref 1500–7800)
Neutrophils Relative %: 52.7 %
Platelets: 246 10*3/uL (ref 140–400)
RBC: 4.68 10*6/uL (ref 4.20–5.80)
RDW: 14.5 % (ref 11.0–15.0)
Total Lymphocyte: 25.8 %
WBC: 8.1 10*3/uL (ref 3.8–10.8)

## 2020-01-23 LAB — HLA-B*58:01 TYPING: HLA-B*58:01 Typing: NEGATIVE

## 2020-01-23 NOTE — Progress Notes (Signed)
CBC is normal, HLA test is negative.  Patient's next prescription refill will be for allopurinol 300 mg p.o. daily.

## 2020-02-04 DIAGNOSIS — M51369 Other intervertebral disc degeneration, lumbar region without mention of lumbar back pain or lower extremity pain: Secondary | ICD-10-CM | POA: Insufficient documentation

## 2020-02-04 DIAGNOSIS — M1A09X0 Idiopathic chronic gout, multiple sites, without tophus (tophi): Secondary | ICD-10-CM | POA: Insufficient documentation

## 2020-02-04 DIAGNOSIS — M5136 Other intervertebral disc degeneration, lumbar region: Secondary | ICD-10-CM | POA: Insufficient documentation

## 2020-02-04 NOTE — Progress Notes (Deleted)
Office Visit Note  Patient: Reginald Long             Date of Birth: 14-Jun-1940           MRN: [id number]             PCP: Wendie Agreste, MD Referring: Wendie Agreste, MD Visit Date: 02/13/2020 Occupation: _0 @  Subjective:  No chief complaint on file.   History of Present Illness: Reginald Long is a 80 y.o. male ***   Activities of Daily Living:  Patient reports morning stiffness for *** {minute/hour:19697}.   Patient {ACTIONS;DENIES/REPORTS:21021675::"Denies"} nocturnal pain.  Difficulty dressing/grooming: {ACTIONS;DENIES/REPORTS:21021675::"Denies"} Difficulty climbing stairs: {ACTIONS;DENIES/REPORTS:21021675::"Denies"} Difficulty getting out of chair: {ACTIONS;DENIES/REPORTS:21021675::"Denies"} Difficulty using hands for taps, buttons, cutlery, and/or writing: {ACTIONS;DENIES/REPORTS:21021675::"Denies"}  No Rheumatology ROS completed.   PMFS History:  Patient Active Problem List   Diagnosis Date Noted  . Acute appendicitis 12/08/2016  . Non-English speaking patient [name] 12/08/2016  . Right renal mass 12/08/2016  . CKD (chronic kidney disease), stage III ([place]) 12/08/2016  . PAF (paroxysmal atrial fibrillation) ([place]) 12/08/2016  . Acute gangrenous appendicitis with perforation and peritonitis 12/08/2016  . Viral enteritis 12/04/2015  . Hyperlipidemia 12/04/2015  . Essential hypertension 12/04/2015  . Right foot pain 12/04/2015  . Acute gout of right ankle 12/04/2015  . Melanoma of skin ([place]) 10/26/2013    Past Medical History:  Diagnosis Date  . Gout   . Hyperlipidemia   . Hypertension   . PAF (paroxysmal atrial fibrillation) ([place]) 12/08/2016    No family history on file. Past Surgical History:  Procedure Laterality Date  . LAPAROSCOPIC APPENDECTOMY N/A 12/08/2016   Procedure: APPENDECTOMY LAPAROSCOPIC;  Surgeon: [name], MD;  Location: [facility];  Service: General;  Laterality: N/A;  . right thumb surgery     Social History   Social History  Narrative   Originally from [place]   Immunization History  Administered Date(s) Administered  . Influenza,inj,Quad PF,6+ Mos 12/04/2015  . Influenza-Unspecified 11/21/2019  . PFIZER SARS-COV-2 Vaccination 04/05/2019, 05/09/2019, 12/19/2019  . Pneumococcal Conjugate-13 01/20/2018     Objective: Vital Signs: There were no vitals taken for this visit.   Physical Exam   Musculoskeletal Exam: ***  CDAI Exam: CDAI Score: - Patient Global: -; Provider Global: - Swollen: -; Tender: - Joint Exam 02/13/2020   No joint exam has been documented for this visit   There is currently no information documented on the homunculus. Go to the Rheumatology activity and complete the homunculus joint exam.  Investigation: No additional findings.  Imaging: DG Knee Complete 4 Views Right  Result Date: 01/16/2020 CLINICAL DATA:  Recurrent pain EXAM: RIGHT KNEE - COMPLETE 4+ VIEW COMPARISON:  None. FINDINGS: Alignment is anatomic. No acute fracture. A small joint effusion is present. Mild marginal spurring. No substantial joint space narrowing. IMPRESSION: Mild degenerative changes. Small joint effusion. Electronically Signed   By: Macy Mis M.D.   On: 01/16/2020 16:49    Recent Labs: Lab Results  Component Value Date   WBC 8.1 01/18/2020   HGB 13.7 01/18/2020   PLT 246 01/18/2020   NA 138 01/16/2020   K 4.6 01/16/2020   CL 101 01/16/2020   CO2 22 01/16/2020   GLUCOSE 272 (H) 01/16/2020   BUN 29 (H) 01/16/2020   CREATININE 1.27 01/16/2020   BILITOT 0.3 10/13/2019   ALKPHOS 115 10/13/2019   AST 23 10/13/2019   ALT 20 10/13/2019   PROT 6.9 10/13/2019   ALBUMIN 4.1 10/13/2019   CALCIUM 9.6 01/16/2020  GFRAA 62 01/16/2020   January 18, 2020 CBC normal except for elevated eosinophils.  HLA B 5801 negative.  Speciality Comments: No specialty comments available.  Procedures:  No procedures performed Allergies: Patient has no known allergies.   Assessment / Plan:     Visit  Diagnoses: No diagnosis found.  Orders: No orders of the defined types were placed in this encounter.  No orders of the defined types were placed in this encounter.   Face-to-face time spent with patient was *** minutes. Greater than 50% of time was spent in counseling and coordination of care.  Follow-Up Instructions: No follow-ups on file.   Bo Merino, MD  Note - This record has been created using Editor, commissioning.  Chart creation errors have been sought, but may not always  have been located. Such creation errors do not reflect on  the standard of medical care.

## 2020-02-07 ENCOUNTER — Other Ambulatory Visit: Payer: Self-pay | Admitting: Family Medicine

## 2020-02-07 DIAGNOSIS — I10 Essential (primary) hypertension: Secondary | ICD-10-CM

## 2020-02-08 ENCOUNTER — Other Ambulatory Visit: Payer: Self-pay | Admitting: Family Medicine

## 2020-02-08 DIAGNOSIS — I10 Essential (primary) hypertension: Secondary | ICD-10-CM

## 2020-02-13 ENCOUNTER — Ambulatory Visit: Payer: Medicare HMO | Admitting: Rheumatology

## 2020-02-13 DIAGNOSIS — K3532 Acute appendicitis with perforation and localized peritonitis, without abscess: Secondary | ICD-10-CM

## 2020-02-13 DIAGNOSIS — C439 Malignant melanoma of skin, unspecified: Secondary | ICD-10-CM

## 2020-02-13 DIAGNOSIS — N183 Chronic kidney disease, stage 3 unspecified: Secondary | ICD-10-CM

## 2020-02-13 DIAGNOSIS — Z8639 Personal history of other endocrine, nutritional and metabolic disease: Secondary | ICD-10-CM

## 2020-02-13 DIAGNOSIS — Z5181 Encounter for therapeutic drug level monitoring: Secondary | ICD-10-CM

## 2020-02-13 DIAGNOSIS — I10 Essential (primary) hypertension: Secondary | ICD-10-CM

## 2020-02-13 DIAGNOSIS — I480 Paroxysmal atrial fibrillation: Secondary | ICD-10-CM

## 2020-02-13 DIAGNOSIS — M25551 Pain in right hip: Secondary | ICD-10-CM

## 2020-02-13 DIAGNOSIS — N2889 Other specified disorders of kidney and ureter: Secondary | ICD-10-CM

## 2020-02-13 DIAGNOSIS — Z789 Other specified health status: Secondary | ICD-10-CM

## 2020-02-13 DIAGNOSIS — M5136 Other intervertebral disc degeneration, lumbar region: Secondary | ICD-10-CM

## 2020-02-13 DIAGNOSIS — M1A09X0 Idiopathic chronic gout, multiple sites, without tophus (tophi): Secondary | ICD-10-CM

## 2020-03-14 ENCOUNTER — Other Ambulatory Visit: Payer: Self-pay | Admitting: Family Medicine

## 2020-03-14 DIAGNOSIS — I10 Essential (primary) hypertension: Secondary | ICD-10-CM

## 2020-04-16 ENCOUNTER — Encounter: Payer: Self-pay | Admitting: Family Medicine

## 2020-04-16 ENCOUNTER — Other Ambulatory Visit: Payer: Self-pay

## 2020-04-16 ENCOUNTER — Ambulatory Visit (INDEPENDENT_AMBULATORY_CARE_PROVIDER_SITE_OTHER): Payer: Medicare HMO | Admitting: Family Medicine

## 2020-04-16 VITALS — BP 174/73 | HR 54 | Temp 97.7°F | Resp 16 | Ht 62.0 in | Wt 134.8 lb

## 2020-04-16 DIAGNOSIS — E1165 Type 2 diabetes mellitus with hyperglycemia: Secondary | ICD-10-CM

## 2020-04-16 DIAGNOSIS — I10 Essential (primary) hypertension: Secondary | ICD-10-CM | POA: Diagnosis not present

## 2020-04-16 DIAGNOSIS — E785 Hyperlipidemia, unspecified: Secondary | ICD-10-CM

## 2020-04-16 DIAGNOSIS — M109 Gout, unspecified: Secondary | ICD-10-CM

## 2020-04-16 DIAGNOSIS — L299 Pruritus, unspecified: Secondary | ICD-10-CM

## 2020-04-16 LAB — POCT GLYCOSYLATED HEMOGLOBIN (HGB A1C): Hemoglobin A1C: 8.2 % — AB (ref 4.0–5.6)

## 2020-04-16 MED ORDER — LISINOPRIL 20 MG PO TABS: 20.0000 mg | ORAL_TABLET | Freq: Every day | ORAL | 1 refills | Status: AC

## 2020-04-16 MED ORDER — METFORMIN HCL 500 MG PO TABS: ORAL_TABLET | ORAL | 1 refills | Status: DC

## 2020-04-16 MED ORDER — AMLODIPINE BESYLATE 5 MG PO TABS: ORAL_TABLET | ORAL | 1 refills | Status: AC

## 2020-04-16 MED ORDER — ATORVASTATIN CALCIUM 10 MG PO TABS: 10.0000 mg | ORAL_TABLET | Freq: Every day | ORAL | 1 refills | Status: DC

## 2020-04-16 NOTE — Patient Instructions (Addendum)
  Take allopurinol DAILY to help prevent gout attack, but I am glad you are feeling better.   Blood pressure elevated here today. Keep a record of your blood pressures outside of the office and if over 130/80 - return for med changes.   Take metformin twice per day for diabetes and recheck levels in 3 months  For dry skin/itching: Drink at least 64 ounces of water daily. Consider a humidifier for the room where you sleep. Bathe once daily. Avoid using HOT water, as it dries skin.  Avoid deodorant soaps (Dial is the worst!) and stick with gentle cleansers (I like Cetaphil Liquid Cleanser, Dove) After bathing, dry off completely, then apply a thick emollient cream (I like Cetaphil Moisturizing Cream, Eucerin or Aveeno).  Apply the cream twice daily, or more!  Return to the clinic or go to the nearest emergency room if any of your symptoms worsen or new symptoms occur.    If you have lab work done today you will be contacted with your lab results within the next 2 weeks.  If you have not heard from [place] then please contact us. The fastest way to get your results is to register for My Chart.   IF you received an x-ray today, you will receive an invoice from [facility]. Please contact [facility] at [phone] with questions or concerns regarding your invoice.   IF you received labwork today, you will receive an invoice from [facility]. Please contact [facility] at [phone] with questions or concerns regarding your invoice.   Our billing staff will not be able to assist you with questions regarding bills from these companies.  You will be contacted with the lab results as soon as they are available. The fastest way to get your results is to activate your My Chart account. Instructions are located on the last page of this paperwork. If you have not heard from [place] regarding the results in 2 weeks, please contact this office.

## 2020-04-16 NOTE — Progress Notes (Signed)
Subjective:  Patient ID: Reginald Long, male    DOB: 26-Mar-1940  Age: 80 y.o. MRN: [id number]  CC:  Chief Complaint  Patient presents with  . Hypertension    Pt has been doing well no concerns today no side effects. Needs refill Lisinopril and amlodipine   . Diabetes    Pt doing well needs refill metformin, pt admits not testing how he should be, recommended to start once daily in mornings.    Here with granddaughter who will be translating - interpreter declined.  HPI Reginald Long presents for   Hypertension: Amlodipine 5 mg daily, lisinopril 20 mg daily.  Reports compliance with meds - no missed doses. Uses pillminder/pillbox.  No swelling, no cough.  Home readings:125/75.  BP Readings from Last 3 Encounters:  04/16/20 (!) 174/73  01/18/20 135/73  01/16/20 (!) 94/57   Lab Results  Component Value Date   CREATININE 1.27 01/16/2020   Gout: Seen by rheumatology,  Dr. Marjory Lies,  December 15.  Allopurinol increased to 300 mg daily.  Colchicine 1 tablet daily.  Plan to increase to twice daily if flare.  Referred to physical therapy for low back pain with pain radiating to right hip. feeling better on higher dose allopurinol, stopped taking as feeling better.   Diabetes: With hyperglycemia, uncontrolled with A1c of 10.1 in December, up from 6.5 in September.  Previously treated with Metformin 500 mg twice daily.  Question med compliance.  If compliant recommended 1000 mg twice daily. Reports taking metformin 574m QD.  He is on statin, ACE inhibitor. No home readings.  Microalbumin: Normal ratio of 8 on 05/27/2018 Optho, foot exam, pneumovax: Due for Pneumovax - declines pneumovax.   Lab Results  Component Value Date   HGBA1C 8.2 (A) 04/16/2020   HGBA1C 10.1 (H) 01/16/2020   HGBA1C 6.5 (H) 10/13/2019   Lab Results  Component Value Date   LDLCALC 91 10/24/2019   CREATININE 1.27 01/16/2020    Some itching in trunk, legs at times. No use of lotion.   History Patient Active  Problem List   Diagnosis Date Noted  . DDD (degenerative disc disease), lumbar 02/04/2020  . Idiopathic chronic gout of multiple sites without tophus 02/04/2020  . Acute appendicitis 12/08/2016  . Non-English speaking patient ([place] 12/08/2016  . Right renal mass 12/08/2016  . CKD (chronic kidney disease), stage III ([facility] 12/08/2016  . PAF (paroxysmal atrial fibrillation) ([facility] 12/08/2016  . Acute gangrenous appendicitis with perforation and peritonitis 12/08/2016  . Viral enteritis 12/04/2015  . Hyperlipidemia 12/04/2015  . Essential hypertension 12/04/2015  . Right foot pain 12/04/2015  . Acute gout of right ankle 12/04/2015  . Melanoma of skin ([place] [place] 10/26/2013   Past Medical History:  Diagnosis Date  . Gout   . Hyperlipidemia   . Hypertension   . PAF ([place]oxysmal atrial fibrillation) ([facility] 12/08/2016   Past Surgical History:  Procedure Laterality Date  . LAPAROSCOPIC APPENDECTOMY N/A 12/08/2016   Procedure: APPENDECTOMY LAPAROSCOPIC;  Surgeon: GMichael Boston MD;  Location: [facility];  Service: General;  Laterality: N/A;  . right thumb surgery     No Known Allergies Prior to Admission medications   Medication Sig Start Date End Date Taking? Authorizing Provider  allopurinol (ZYLOPRIM) 100 MG tablet TAKE 2 TABLETS(200 MG) BY MOUTH DAILY 01/08/20  Yes GWendie Agreste MD  amLODipine (NORVASC) 5 MG tablet TAKE 1 TABLET(5 MG) BY MOUTH DAILY 01/16/20  Yes GWendie Agreste MD  atorvastatin (LIPITOR) 10 MG tablet TAKE 1 TABLET(10 MG) BY  MOUTH DAILY AT 6 PM 11/25/19  Yes Wendie Agreste, MD  baclofen (LIORESAL) 10 MG tablet Take 1 tablet (10 mg total) by mouth 3 (three) times daily as needed (for hiccups.). 10/13/19  Yes Wendie Agreste, MD  blood glucose meter kit and supplies Dispense based on patient and insurance preference.  Once per day - fasting or 2 hours after meal. 01/03/19  Yes Wendie Agreste, MD  cetirizine (ZYRTEC ALLERGY) 10 MG tablet Take 1 tablet (10 mg total)  by mouth daily. Patient taking differently: Take 10 mg by mouth as needed. 11/26/19  Yes Volney American, PA-C  colchicine 0.6 MG tablet 2 at onset of gout, 1 tab in 1 hour if needed. 05/27/19  Yes Wendie Agreste, MD  lisinopril (ZESTRIL) 20 MG tablet TAKE 1 TABLET(20 MG) BY MOUTH DAILY 02/09/20  Yes Wendie Agreste, MD  metFORMIN (GLUCOPHAGE) 500 MG tablet TAKE 1 TABLET(500 MG) BY MOUTH TWICE DAILY WITH A MEAL 11/24/19  Yes Wendie Agreste, MD  [facility] ULTRA test strip USE AS DIRECTED DAILY 01/31/19  Yes Wendie Agreste, MD  triamcinolone cream (KENALOG) 0.1 % Apply 1 application topically 2 (two) times daily. 09/14/19  Yes Wendie Agreste, MD  omeprazole (PRILOSEC) 20 MG capsule TAKE 1 CAPSULE(20 MG) BY MOUTH DAILY Patient not taking: Reported on 04/16/2020 12/23/19   Wendie Agreste, MD   Social History   Socioeconomic History  . Marital status: Married    Spouse name: Not on file  . Number of children: Not on file  . Years of education: Not on file  . Highest education level: Not on file  Occupational History  . Not on file  Tobacco Use  . Smoking status: Never Smoker  . Smokeless tobacco: Never Used  Vaping Use  . Vaping Use: Never used  Substance and Sexual Activity  . Alcohol use: No  . Drug use: No  . Sexual activity: Not on file  Other Topics Concern  . Not on file  Social History Narrative   Originally from [place]   Social Determinants of Health   Financial Resource Strain: Not on [employer] Insecurity: Not on file  Transportation Needs: Not on file  Physical Activity: Not on file  Stress: Not on file  Social Connections: Not on file  Intimate Partner Violence: Not on file    Review of Systems  Constitutional: Negative for fatigue and unexpected weight change.  Eyes: Negative for visual disturbance.  Respiratory: Negative for cough, chest tightness and shortness of breath.   Cardiovascular: Negative for chest pain, palpitations and leg  swelling.  Gastrointestinal: Negative for abdominal pain and blood in stool.  Neurological: Negative for dizziness, light-headedness and headaches.     Objective:   Vitals:   04/16/20 1408  BP: (!) 174/73  Pulse: (!) 54  Resp: 16  Temp: 97.7 F (36.5 C)  TempSrc: Temporal  SpO2: 97%  Weight: 134 lb 12.8 oz (61.1 kg)  Height: '5\' 2"'  (1.575 m)     Physical Exam Vitals reviewed.  Constitutional:      Appearance: Normal appearance. He is well-developed.  HENT:     Head: Normocephalic and atraumatic.  Eyes:     Pupils: Pupils are equal, round, and reactive to light.  Neck:     Vascular: No carotid bruit or JVD.  Cardiovascular:     Rate and Rhythm: Normal rate and regular rhythm.     Heart sounds: Normal heart sounds. No murmur heard.  Pulmonary:     Effort: Pulmonary effort is normal.     Breath sounds: Normal breath sounds. No rales.  Skin:    General: Skin is warm and dry.       Neurological:     Mental Status: He is alert and oriented to person, place, and time.    Results for orders placed or performed in visit on 04/16/20  POCT glycosylated hemoglobin (Hb A1C)  Result Value Ref Range   Hemoglobin A1C 8.2 (A) 4.0 - 5.6 %   HbA1c POC (<> result, manual entry)     HbA1c, POC (prediabetic range)     HbA1c, POC (controlled diabetic range)       Assessment & Plan:  Lev Cervone is a 80 y.o. male . Type 2 diabetes mellitus with hyperglycemia, without long-term current use of insulin (HCC) - Plan: metFORMIN (GLUCOPHAGE) 500 MG tablet, POCT glycosylated hemoglobin (Hb A1C), Comprehensive metabolic panel  -Improved but advised to take metformin twice per day.  Check labs.  12-monthrecheck.  Gout of multiple sites, unspecified cause, unspecified chronicity  -Recommend allopurinol daily to decrease risk of recurrence, rheumatology eval/treatment as above.  Essential hypertension - Plan: amLODipine (NORVASC) 5 MG tablet, lisinopril (ZESTRIL) 20 MG tablet  -Variable  readings, question med adherence but reports he has been adherent to home regimen.  Home readings better than in office, home monitoring with RTC precautions given, continue same regimen for now.  Hyperlipidemia, unspecified hyperlipidemia type - Plan: atorvastatin (LIPITOR) 10 MG tablet, Comprehensive metabolic panel, Lipid panel  -Tolerating Lipitor, continue same  Pruritus  -Possible dry skin dermatitis, hydrating lotion and information provided on AVS.  RTC precautions if new rash or not improving.  Meds ordered this encounter  Medications  . amLODipine (NORVASC) 5 MG tablet    Sig: TAKE 1 TABLET(5 MG) BY MOUTH DAILY    Dispense:  90 tablet    Refill:  1  . atorvastatin (LIPITOR) 10 MG tablet    Sig: Take 1 tablet (10 mg total) by mouth daily.    Dispense:  90 tablet    Refill:  1  . lisinopril (ZESTRIL) 20 MG tablet    Sig: Take 1 tablet (20 mg total) by mouth daily.    Dispense:  90 tablet    Refill:  1  . metFORMIN (GLUCOPHAGE) 500 MG tablet    Sig: TAKE 1 TABLET(500 MG) BY MOUTH TWICE DAILY WITH A MEAL    Dispense:  180 tablet    Refill:  1   Patient Instructions    Take allopurinol DAILY to help prevent gout attack, but I am glad you are feeling better.   Blood pressure elevated here today. Keep a record of your blood pressures outside of the office and if over 130/80 - return for med changes.   Take metformin twice per day for diabetes and recheck levels in 3 months  For dry skin/itching: Drink at least 64 ounces of water daily. Consider a humidifier for the room where you sleep. Bathe once daily. Avoid using HOT water, as it dries skin.  Avoid deodorant soaps (Dial is the worst!) and stick with gentle cleansers (I like Cetaphil Liquid Cleanser, Dove) After bathing, dry off completely, then apply a thick emollient cream (I like Cetaphil Moisturizing Cream, Eucerin or Aveeno).  Apply the cream twice daily, or more!  Return to the clinic or go to the nearest  emergency room if any of your symptoms worsen or new symptoms occur.  If you have lab work done today you will be contacted with your lab results within the next 2 weeks.  If you have not heard from [place] then please contact us. The fastest way to get your results is to register for My Chart.   IF you received an x-ray today, you will receive an invoice from [facility]. Please contact [facility] at [phone] with questions or concerns regarding your invoice.   IF you received labwork today, you will receive an invoice from [facility]. Please contact [facility] at [phone] with questions or concerns regarding your invoice.   Our billing staff will not be able to assist you with questions regarding bills from these companies.  You will be contacted with the lab results as soon as they are available. The fastest way to get your results is to activate your My Chart account. Instructions are located on the last page of this paperwork. If you have not heard from [place] regarding the results in 2 weeks, please contact this office.         Signed, Merri Ray, MD [facility]

## 2020-04-17 LAB — COMPREHENSIVE METABOLIC PANEL WITH GFR
ALT: 18 [IU]/L (ref 0–44)
AST: 22 [IU]/L (ref 0–40)
Albumin/Globulin Ratio: 1.9 (ref 1.2–2.2)
Albumin: 4.7 g/dL (ref 3.7–4.7)
Alkaline Phosphatase: 95 [IU]/L (ref 44–121)
BUN/Creatinine Ratio: 9 — ABNORMAL LOW (ref 10–24)
BUN: 11 mg/dL (ref 8–27)
Bilirubin Total: 0.3 mg/dL (ref 0.0–1.2)
CO2: 20 mmol/L (ref 20–29)
Calcium: 10 mg/dL (ref 8.6–10.2)
Chloride: 105 mmol/L (ref 96–106)
Creatinine, Ser: 1.27 mg/dL (ref 0.76–1.27)
Globulin, Total: 2.5 g/dL (ref 1.5–4.5)
Glucose: 87 mg/dL (ref 65–99)
Potassium: 4.6 mmol/L (ref 3.5–5.2)
Sodium: 144 mmol/L (ref 134–144)
Total Protein: 7.2 g/dL (ref 6.0–8.5)
eGFR: 57 mL/min/{1.73_m2} — ABNORMAL LOW

## 2020-04-17 LAB — LIPID PANEL
Chol/HDL Ratio: 3.2 ratio (ref 0.0–5.0)
Cholesterol, Total: 223 mg/dL — ABNORMAL HIGH (ref 100–199)
HDL: 70 mg/dL
LDL Chol Calc (NIH): 104 mg/dL — ABNORMAL HIGH (ref 0–99)
Triglycerides: 293 mg/dL — ABNORMAL HIGH (ref 0–149)
VLDL Cholesterol Cal: 49 mg/dL — ABNORMAL HIGH (ref 5–40)

## 2020-04-26 ENCOUNTER — Encounter: Payer: Self-pay | Admitting: Emergency Medicine

## 2020-07-17 ENCOUNTER — Other Ambulatory Visit: Payer: Self-pay | Admitting: Family Medicine

## 2020-07-17 DIAGNOSIS — M109 Gout, unspecified: Secondary | ICD-10-CM

## 2020-09-03 ENCOUNTER — Other Ambulatory Visit: Payer: Self-pay

## 2020-09-03 ENCOUNTER — Ambulatory Visit (HOSPITAL_COMMUNITY)
Admission: EM | Admit: 2020-09-03 | Discharge: 2020-09-03 | Disposition: A | Discharge status: Home / Self Care | Payer: Medicare HMO | Attending: Student | Admitting: Student

## 2020-09-03 ENCOUNTER — Encounter (HOSPITAL_COMMUNITY): Payer: Self-pay | Admitting: Emergency Medicine

## 2020-09-03 DIAGNOSIS — E1169 Type 2 diabetes mellitus with other specified complication: Secondary | ICD-10-CM

## 2020-09-03 DIAGNOSIS — M10071 Idiopathic gout, right ankle and foot: Secondary | ICD-10-CM | POA: Diagnosis not present

## 2020-09-03 DIAGNOSIS — N183 Chronic kidney disease, stage 3 unspecified: Secondary | ICD-10-CM

## 2020-09-03 DIAGNOSIS — E1122 Type 2 diabetes mellitus with diabetic chronic kidney disease: Secondary | ICD-10-CM | POA: Diagnosis not present

## 2020-09-03 MED ORDER — PREDNISONE 20 MG PO TABS: 20.0000 mg | ORAL_TABLET | Freq: Every day | ORAL | 0 refills | Status: AC

## 2020-09-03 NOTE — Discharge Instructions (Addendum)
-  Prednisone one pill daily x5 days. I recommend taking this in the morning as it could give you energy.  Avoid NSAIDs like ibuprofen and alleve while taking this medication as they can increase your risk of stomach upset and even GI bleeding when in combination with a steroid. You can continue tylenol (acetaminophen) up to '1000mg'$  3x daily. -Avoid red meat, seafood, beer -Continue allopurinol as prescribed by your doctor

## 2020-09-03 NOTE — ED Provider Notes (Signed)
[facility]    CSN: [id number] Arrival date & time: 09/03/20  1023      History   Chief Complaint Chief Complaint  Patient presents with   Foot Pain    Right    HPI Reginald Long is a 80 y.o. male presenting with gout R foot. Medical history chronic idiopathic gout.  Also with CKD stage III and diabetes.  Endorses 3 days of right foot pain, worse over the second MTP joint.  Denies trauma or overuse.  Has been taking his allopurinol as directed.  Denies recent red meat, seafood, alcohol.  Has had good results with prednisone in the past for gout flares, but has not taken any of this recently.  HPI  Past Medical History:  Diagnosis Date   Gout    Hyperlipidemia    Hypertension    PAF (paroxysmal atrial fibrillation) ([place]) 12/08/2016    Patient Active Problem List   Diagnosis Date Noted   DDD (degenerative disc disease), lumbar 02/04/2020   Idiopathic chronic gout of multiple sites without tophus 02/04/2020   Acute appendicitis 12/08/2016   Non-English speaking patient ([place]) 12/08/2016   Right renal mass 12/08/2016   CKD (chronic kidney disease), stage III ([place]) 12/08/2016   PAF (paroxysmal atrial fibrillation) ([place]) 12/08/2016   Acute gangrenous appendicitis with perforation and peritonitis 12/08/2016   Viral enteritis 12/04/2015   Hyperlipidemia 12/04/2015   Essential hypertension 12/04/2015   Right foot pain 12/04/2015   Acute gout of right ankle 12/04/2015   Melanoma of skin ([place]) 10/26/2013    Past Surgical History:  Procedure Laterality Date   LAPAROSCOPIC APPENDECTOMY N/A 12/08/2016   Procedure: APPENDECTOMY LAPAROSCOPIC;  Surgeon: [name], MD;  Location: [facility];  Service: General;  Laterality: N/A;   right thumb surgery         Home Medications    Prior to Admission medications   Medication Sig Start Date End Date Taking? Authorizing Provider  predniSONE (DELTASONE) 20 MG tablet Take 1 tablet (20 mg total) by mouth daily for 5 days.  09/03/20 09/08/20 Yes Hazel Sams, PA-C  allopurinol (ZYLOPRIM) 100 MG tablet TAKE 2 TABLETS BY MOUTH EVERY DAY 07/17/20   Wendie Agreste, MD  amLODipine (NORVASC) 5 MG tablet TAKE 1 TABLET(5 MG) BY MOUTH DAILY 04/16/20   Wendie Agreste, MD  atorvastatin (LIPITOR) 10 MG tablet Take 1 tablet (10 mg total) by mouth daily. 04/16/20   Wendie Agreste, MD  baclofen (LIORESAL) 10 MG tablet Take 1 tablet (10 mg total) by mouth 3 (three) times daily as needed (for hiccups.). 10/13/19   Wendie Agreste, MD  blood glucose meter kit and supplies Dispense based on patient and insurance preference.  Once per day - fasting or 2 hours after meal. 01/03/19   Wendie Agreste, MD  cetirizine (ZYRTEC ALLERGY) 10 MG tablet Take 1 tablet (10 mg total) by mouth daily. Patient taking differently: Take 10 mg by mouth as needed. 11/26/19   Volney American, PA-C  colchicine 0.6 MG tablet 2 at onset of gout, 1 tab in 1 hour if needed. 05/27/19   Wendie Agreste, MD  lisinopril (ZESTRIL) 20 MG tablet Take 1 tablet (20 mg total) by mouth daily. 04/16/20   Wendie Agreste, MD  metFORMIN (GLUCOPHAGE) 500 MG tablet TAKE 1 TABLET(500 MG) BY MOUTH TWICE DAILY WITH A MEAL 04/16/20   Wendie Agreste, MD  omeprazole (PRILOSEC) 20 MG capsule TAKE 1 CAPSULE(20 MG) BY MOUTH DAILY Patient not  taking: Reported on 04/16/2020 12/23/19   Wendie Agreste, MD  [facility] ULTRA test strip USE AS DIRECTED DAILY 01/31/19   Wendie Agreste, MD  triamcinolone cream (KENALOG) 0.1 % Apply 1 application topically 2 (two) times daily. 09/14/19   Wendie Agreste, MD    Family History History reviewed. No pertinent family history.  Social History Social History   Tobacco Use   Smoking status: Never   Smokeless tobacco: Never  Vaping Use   Vaping Use: Never used  Substance Use Topics   Alcohol use: No   Drug use: No     Allergies   Patient has no known allergies.   Review of Systems Review of Systems   Musculoskeletal:        R foot pain  All other systems reviewed and are negative.   Physical Exam Triage Vital Signs ED Triage Vitals  Enc Vitals Group     BP 09/03/20 1122 (!) 166/58     Pulse Rate 09/03/20 1122 (!) 50     Resp 09/03/20 1122 16     Temp 09/03/20 1122 98.2 F (36.8 C)     Temp Source 09/03/20 1122 Oral     SpO2 09/03/20 1122 99 %     Weight --      Height --      Head Circumference --      Peak Flow --      Pain Score 09/03/20 1120 6     Pain Loc --      Pain Edu? --      Excl. in [place]? --    No data found.  Updated Vital Signs BP (!) 166/58 (BP Location: Right Arm)   Pulse (!) 50   Temp 98.2 F (36.8 C) (Oral)   Resp 16   SpO2 99%   Visual Acuity Right Eye Distance:   Left Eye Distance:   Bilateral Distance:    Right Eye Near:   Left Eye Near:    Bilateral Near:     Physical Exam Vitals reviewed.  Constitutional:      General: He is not in acute distress.    Appearance: Normal appearance. He is not ill-appearing or diaphoretic.  HENT:     Head: Normocephalic and atraumatic.  Cardiovascular:     Rate and Rhythm: Normal rate and regular rhythm.     Heart sounds: Normal heart sounds.  Pulmonary:     Effort: Pulmonary effort is normal.     Breath sounds: Normal breath sounds.  Musculoskeletal:     Comments: R foot- tenderness and mild effusion over R 2nd MTP joint, without bony abnormality. ROM toes intact and without pain. No midfoot tenderness. DP 2+, cap refill <2 seconds.   Skin:    General: Skin is warm.  Neurological:     General: No focal deficit present.     Mental Status: He is alert and oriented to person, place, and time.  Psychiatric:        Mood and Affect: Mood normal.        Behavior: Behavior normal.        Thought Content: Thought content normal.        Judgment: Judgment normal.     UC Treatments / Results  Labs (all labs ordered are listed, but only abnormal results are displayed) Labs Reviewed - No data to  display  EKG   Radiology No results found.  Procedures Procedures (including critical care time)  Medications Ordered in UC  Medications - No data to display  Initial Impression / Assessment and Plan / UC Course  I have reviewed the triage vital signs and the nursing notes.  Pertinent labs & imaging results that were available during my care of the patient were reviewed by me and considered in my medical decision making (see chart for details).     This patient is a very pleasant 80 y.o. year old male presenting with idiopathic gout R foot.   A1c 8.2 04/2020, continue current regimen. Pt with CKD, avoid NSAIDs. Continue daily allopurinol. Prednisone 9m x5 days sent. Low-purine diet.  ED return precautions discussed. Patient verbalizes understanding and agreement.   Translation provided by family member at patient request.  Final Clinical Impressions(s) / UC Diagnoses   Final diagnoses:  Type 2 diabetes mellitus with other specified complication, without long-term current use of insulin ([place]  Stage 3 chronic kidney disease, unspecified whether stage 3a or 3b CKD ([place]  Acute idiopathic gout involving toe of right foot     Discharge Instructions      -Prednisone one pill daily x5 days. I recommend taking this in the morning as it could give you energy.  Avoid NSAIDs like ibuprofen and alleve while taking this medication as they can increase your risk of stomach upset and even GI bleeding when in combination with a steroid. You can continue tylenol (acetaminophen) up to 10060m3x daily. -Avoid red meat, seafood, beer -Continue allopurinol as prescribed by your doctor      ED Prescriptions     Medication Sig Dispense Auth. Provider   predniSONE (DELTASONE) 20 MG tablet Take 1 tablet (20 mg total) by mouth daily for 5 days. 5 tablet GrHazel SamsPA-C      PDMP not reviewed this encounter.   GrHazel SamsPA-C 09/03/20 1203

## 2020-09-03 NOTE — ED Triage Notes (Signed)
Pt presents with right foot pain. States has hx of gout and has been taking prednisone and allopurinol with no relief.

## 2020-09-04 ENCOUNTER — Ambulatory Visit (HOSPITAL_COMMUNITY): Payer: Self-pay

## 2020-10-12 ENCOUNTER — Other Ambulatory Visit: Payer: Self-pay | Admitting: Family Medicine

## 2020-10-12 DIAGNOSIS — M109 Gout, unspecified: Secondary | ICD-10-CM

## 2020-10-12 DIAGNOSIS — E1165 Type 2 diabetes mellitus with hyperglycemia: Secondary | ICD-10-CM

## 2020-10-12 DIAGNOSIS — E785 Hyperlipidemia, unspecified: Secondary | ICD-10-CM

## 2020-12-24 DIAGNOSIS — R062 Wheezing: Secondary | ICD-10-CM | POA: Diagnosis not present

## 2020-12-24 DIAGNOSIS — E782 Mixed hyperlipidemia: Secondary | ICD-10-CM | POA: Diagnosis not present

## 2020-12-24 DIAGNOSIS — Z0000 Encounter for general adult medical examination without abnormal findings: Secondary | ICD-10-CM | POA: Diagnosis not present

## 2020-12-24 DIAGNOSIS — E119 Type 2 diabetes mellitus without complications: Secondary | ICD-10-CM | POA: Diagnosis not present

## 2020-12-24 DIAGNOSIS — I10 Essential (primary) hypertension: Secondary | ICD-10-CM | POA: Diagnosis not present

## 2020-12-24 DIAGNOSIS — R0781 Pleurodynia: Secondary | ICD-10-CM | POA: Diagnosis not present

## 2020-12-24 DIAGNOSIS — L299 Pruritus, unspecified: Secondary | ICD-10-CM | POA: Diagnosis not present

## 2020-12-24 DIAGNOSIS — L308 Other specified dermatitis: Secondary | ICD-10-CM | POA: Diagnosis not present

## 2020-12-24 DIAGNOSIS — K219 Gastro-esophageal reflux disease without esophagitis: Secondary | ICD-10-CM | POA: Diagnosis not present

## 2021-01-10 ENCOUNTER — Other Ambulatory Visit: Payer: Self-pay | Admitting: Family Medicine

## 2021-01-10 DIAGNOSIS — M109 Gout, unspecified: Secondary | ICD-10-CM

## 2021-01-27 ENCOUNTER — Other Ambulatory Visit: Payer: Self-pay | Admitting: Family Medicine

## 2021-01-27 DIAGNOSIS — E1165 Type 2 diabetes mellitus with hyperglycemia: Secondary | ICD-10-CM

## 2021-01-27 DIAGNOSIS — E785 Hyperlipidemia, unspecified: Secondary | ICD-10-CM

## 2021-03-04 DIAGNOSIS — I4891 Unspecified atrial fibrillation: Secondary | ICD-10-CM | POA: Diagnosis not present

## 2021-03-04 DIAGNOSIS — Z23 Encounter for immunization: Secondary | ICD-10-CM | POA: Diagnosis not present

## 2021-03-04 DIAGNOSIS — E119 Type 2 diabetes mellitus without complications: Secondary | ICD-10-CM | POA: Diagnosis not present

## 2021-03-04 DIAGNOSIS — I480 Paroxysmal atrial fibrillation: Secondary | ICD-10-CM | POA: Diagnosis not present

## 2021-03-04 DIAGNOSIS — L282 Other prurigo: Secondary | ICD-10-CM | POA: Diagnosis not present

## 2021-03-04 DIAGNOSIS — I10 Essential (primary) hypertension: Secondary | ICD-10-CM | POA: Diagnosis not present

## 2021-03-04 DIAGNOSIS — E7800 Pure hypercholesterolemia, unspecified: Secondary | ICD-10-CM | POA: Diagnosis not present

## 2021-03-04 DIAGNOSIS — I499 Cardiac arrhythmia, unspecified: Secondary | ICD-10-CM | POA: Diagnosis not present

## 2021-03-04 DIAGNOSIS — C439 Malignant melanoma of skin, unspecified: Secondary | ICD-10-CM | POA: Diagnosis not present

## 2021-03-05 ENCOUNTER — Telehealth: Payer: Self-pay

## 2021-03-05 DIAGNOSIS — I499 Cardiac arrhythmia, unspecified: Secondary | ICD-10-CM | POA: Diagnosis not present

## 2021-03-05 DIAGNOSIS — I4891 Unspecified atrial fibrillation: Secondary | ICD-10-CM | POA: Diagnosis not present

## 2021-03-05 NOTE — Telephone Encounter (Signed)
NOTES SCANNED TO REFERRAL 

## 2021-03-24 NOTE — Progress Notes (Unsigned)
Cardiology Office Note:   Date:  03/24/2021  NAME:  Reginald Long    MRN: [id number] DOB:  [birth year]   PCP:  Wendie Agreste, MD  Cardiologist:  None  Electrophysiologist:  None   Referring MD: Bartholome Bill, MD   No chief complaint on file. ***  History of Present Illness:   Reginald Long is a 81 y.o. male with a hx of diabetes, hypertension, hyperlipidemia who is being seen today for the evaluation of atrial fibrillation at the request of Bartholome Bill, MD. recently established with primary care physician on 03/04/2021.  Found to be in A-fib during that office visit.  Referred for evaluation.  Problem List DM -A1c 6.5 HTN HLD Atrial fibrillation -Dx 03/04/2021 -CHADSVASC=4 (age, DM, HTN)  Past Medical History: Past Medical History:  Diagnosis Date   Gout    Hyperlipidemia    Hypertension    PAF (paroxysmal atrial fibrillation) ([place]) 12/08/2016    Past Surgical History: Past Surgical History:  Procedure Laterality Date   LAPAROSCOPIC APPENDECTOMY N/A 12/08/2016   Procedure: APPENDECTOMY LAPAROSCOPIC;  Surgeon: [name], MD;  Location: [facility];  Service: General;  Laterality: N/A;   right thumb surgery      Current Medications: No outpatient medications have been marked as taking for the 03/27/21 encounter (Appointment) with Geralynn Rile, MD.     Allergies:    Patient has no known allergies.   Social History: Social History   Socioeconomic History   Marital status: Married    Spouse name: Not on file   Number of children: Not on file   Years of education: Not on file   Highest education level: Not on file  Occupational History   Not on file  Tobacco Use   Smoking status: Never   Smokeless tobacco: Never  Vaping Use   Vaping Use: Never used  Substance and Sexual Activity   Alcohol use: No   Drug use: No   Sexual activity: Not on file  Other Topics Concern   Not on file  Social History Narrative   Originally from [place]   Social  Determinants of Health   Financial Resource Strain: Not on file  Food Insecurity: Not on file  Transportation Needs: Not on file  Physical Activity: Not on file  Stress: Not on file  Social Connections: Not on file     Family History: The patient's ***family history is not on file.  ROS:   All other ROS reviewed and negative. Pertinent positives noted in the HPI.     EKGs/Labs/Other Studies Reviewed:   The following studies were personally reviewed by me today:  EKG:  EKG is *** ordered today.  The ekg ordered today demonstrates ***, and was personally reviewed by me.   Recent Labs: 04/16/2020: ALT 18; BUN 11; Creatinine, Ser 1.27; Potassium 4.6; Sodium 144   Recent Lipid Panel    Component Value Date/Time   CHOL 223 (H) 04/16/2020 1501   TRIG 293 (H) 04/16/2020 1501   HDL 70 04/16/2020 1501   CHOLHDL 3.2 04/16/2020 1501   CHOLHDL 3.5 12/04/2015 1643   VLDL 14 12/04/2015 1643   LDLCALC 104 (H) 04/16/2020 1501    Physical Exam:   VS:  There were no vitals taken for this visit.   Wt Readings from Last 3 Encounters:  04/16/20 134 lb 12.8 oz (61.1 kg)  01/18/20 130 lb (59 kg)  01/16/20 128 lb (58.1 kg)    General: Well nourished, well developed, in no  acute distress Head: Atraumatic, normal size  Eyes: PEERLA, EOMI  Neck: Supple, no JVD Endocrine: No thryomegaly Cardiac: Normal S1, S2; RRR; no murmurs, rubs, or gallops Lungs: Clear to auscultation bilaterally, no wheezing, rhonchi or rales  Abd: Soft, nontender, no hepatomegaly  Ext: No edema, pulses 2+ Musculoskeletal: No deformities, BUE and BLE strength normal and equal Skin: Warm and dry, no rashes   Neuro: Alert and oriented to person, place, time, and situation, CNII-XII grossly intact, no focal deficits  Psych: Normal mood and affect   ASSESSMENT:   Reginald Long is a 81 y.o. male who presents for the following: No diagnosis found.  PLAN:   There are no diagnoses linked to this encounter.  {Are you  ordering a CV Procedure (e.g. stress test, cath, DCCV, TEE, etc)?   Press F2        :[id number]}  Disposition: No follow-ups on file.  Medication Adjustments/Labs and Tests Ordered: Current medicines are reviewed at length with the patient today.  Concerns regarding medicines are outlined above.  No orders of the defined types were placed in this encounter.  No orders of the defined types were placed in this encounter.   There are no Patient Instructions on file for this visit.   Time Spent with Patient: I have spent a total of *** minutes with patient reviewing hospital notes, telemetry, EKGs, labs and examining the patient as well as establishing an assessment and plan that was discussed with the patient.  > 50% of time was spent in direct patient care.  Signed, Addison Naegeli. Audie Box, MD, [facility]  [address], [place], [place] [address] [phone]  03/24/2021 4:02 PM

## 2021-03-27 ENCOUNTER — Ambulatory Visit: Payer: Medicare HMO | Admitting: Cardiovascular Disease

## 2021-03-27 DIAGNOSIS — E785 Hyperlipidemia, unspecified: Secondary | ICD-10-CM

## 2021-03-27 DIAGNOSIS — I480 Paroxysmal atrial fibrillation: Secondary | ICD-10-CM

## 2021-03-27 DIAGNOSIS — I10 Essential (primary) hypertension: Secondary | ICD-10-CM

## 2021-04-01 ENCOUNTER — Other Ambulatory Visit: Payer: Self-pay

## 2021-04-01 DIAGNOSIS — Z139 Encounter for screening, unspecified: Secondary | ICD-10-CM

## 2021-04-01 LAB — GLUCOSE, POCT (MANUAL RESULT ENTRY): POC Glucose: 127 mg/dl — AB (ref 70–99)

## 2021-06-13 ENCOUNTER — Other Ambulatory Visit: Payer: Self-pay | Admitting: Physician Assistant

## 2021-06-13 DIAGNOSIS — I10 Essential (primary) hypertension: Secondary | ICD-10-CM

## 2021-06-19 ENCOUNTER — Other Ambulatory Visit: Payer: Self-pay | Admitting: Physician Assistant

## 2021-08-15 IMAGING — DX DG LUMBAR SPINE 2-3V
3 series · 3 of 3 positions shown · non-contrast
Comparison: CT Abdomen and Pelvis 12/18/2016. right hip series
today.

CLINICAL DATA: 79-year-old male with posterior right hip pain. No
known injury.

EXAM:
LUMBAR SPINE - 2-3 VIEW

[l-spine ap]
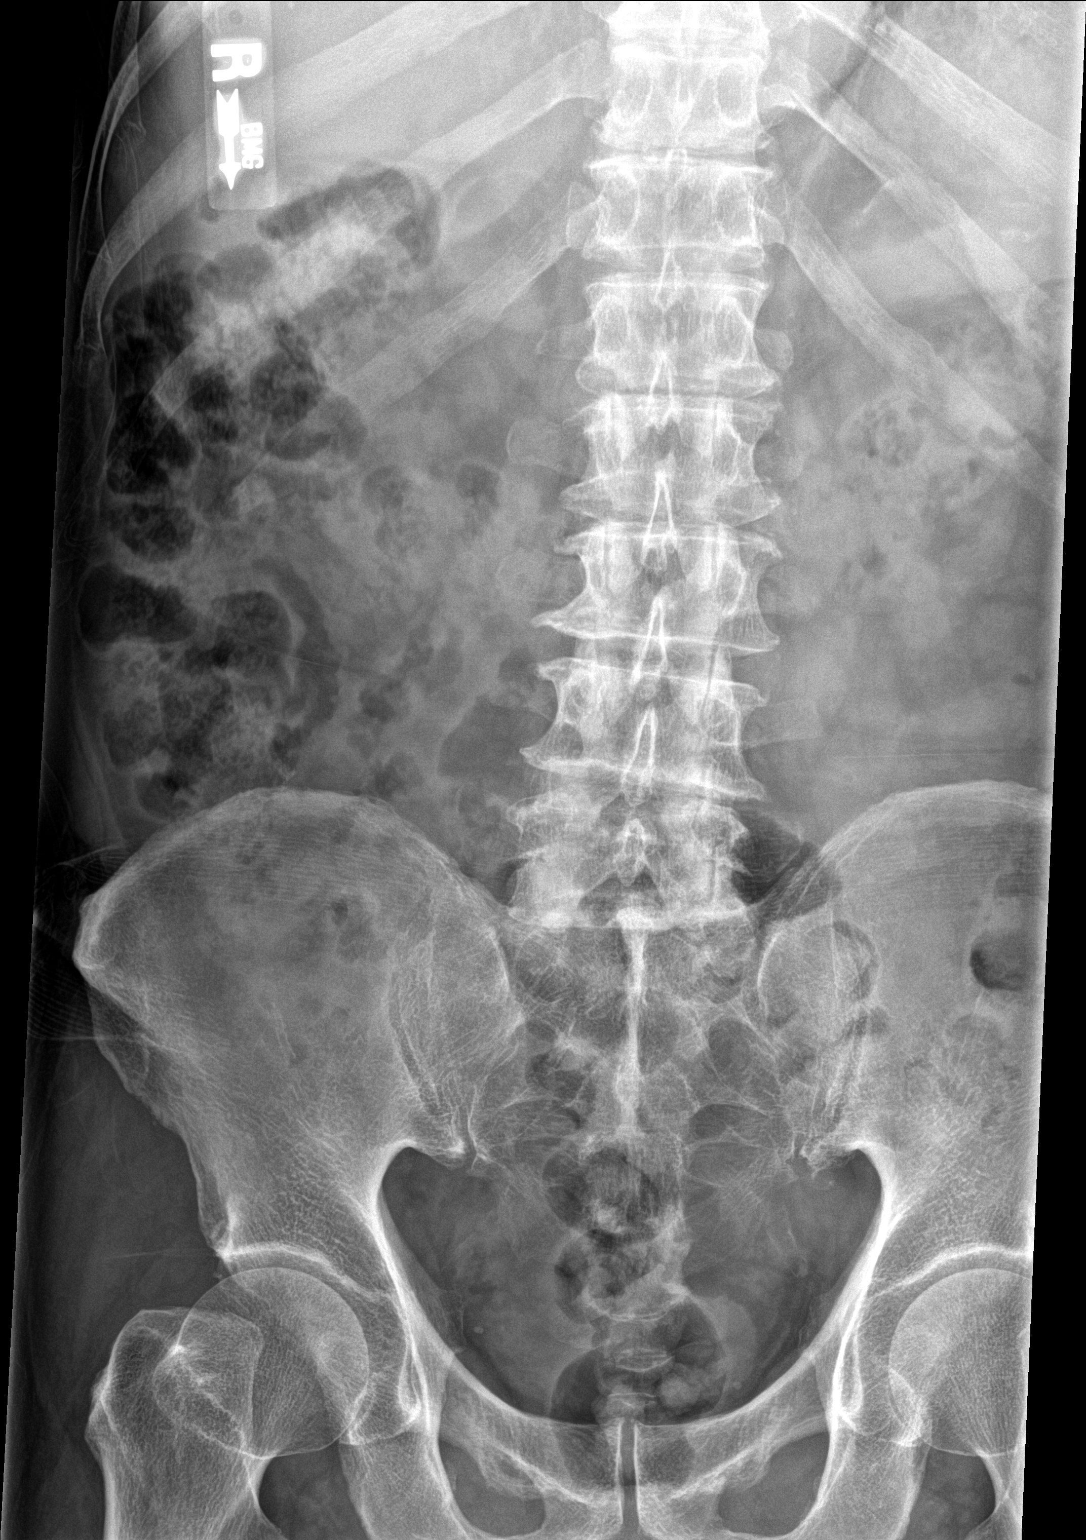

[l-spine lat]
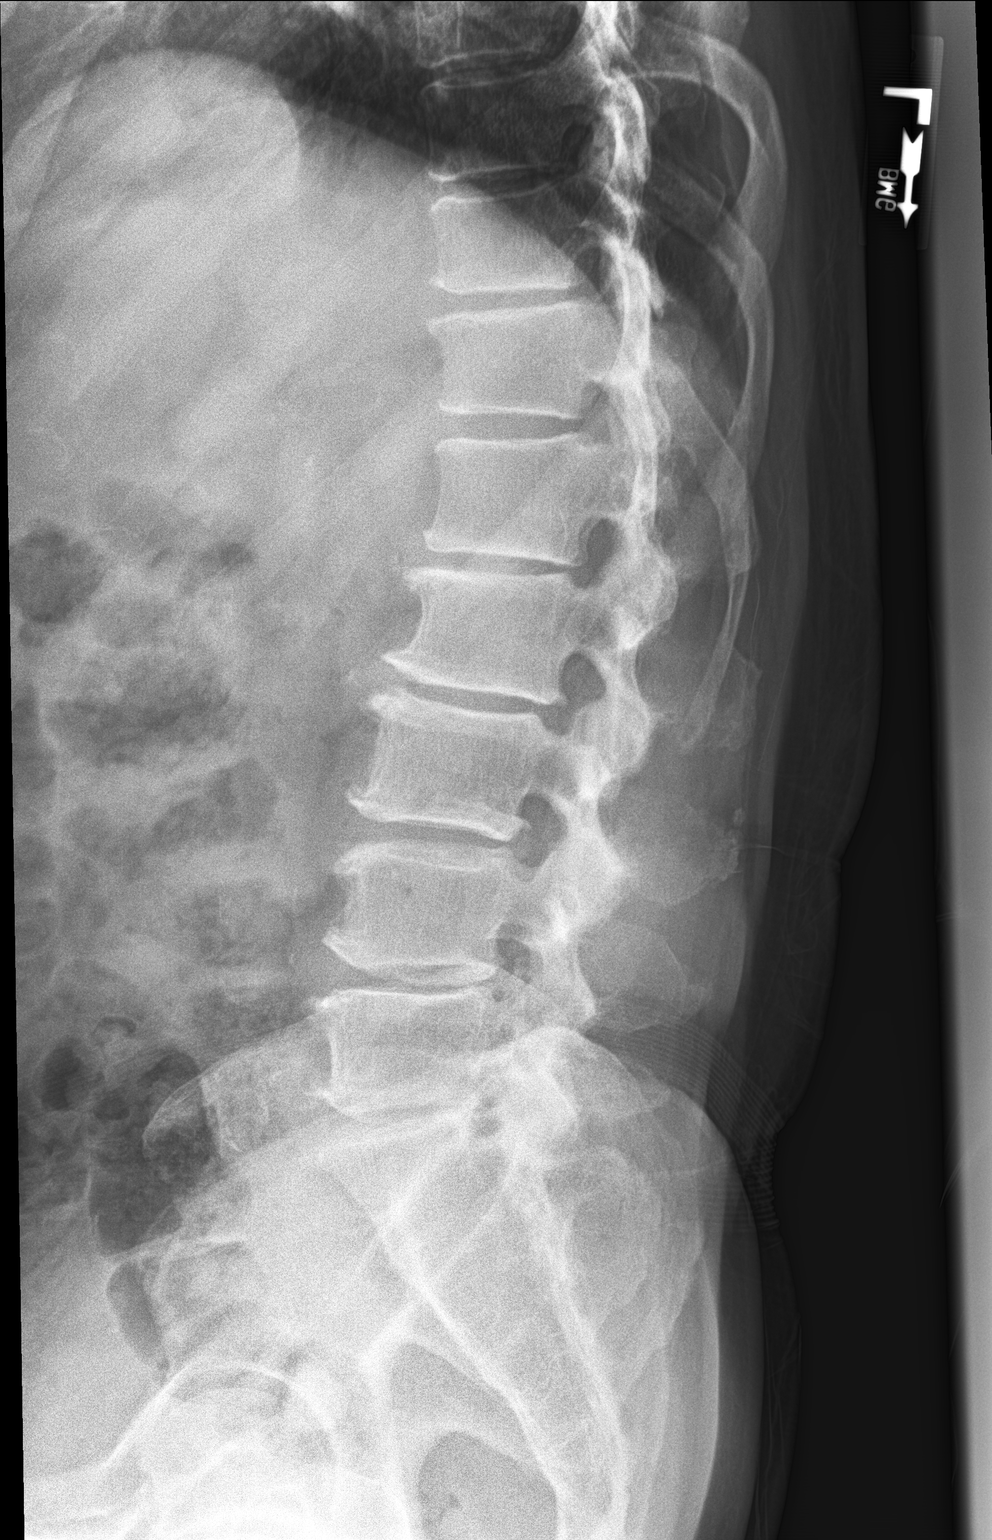

[l-spine l5-s1]
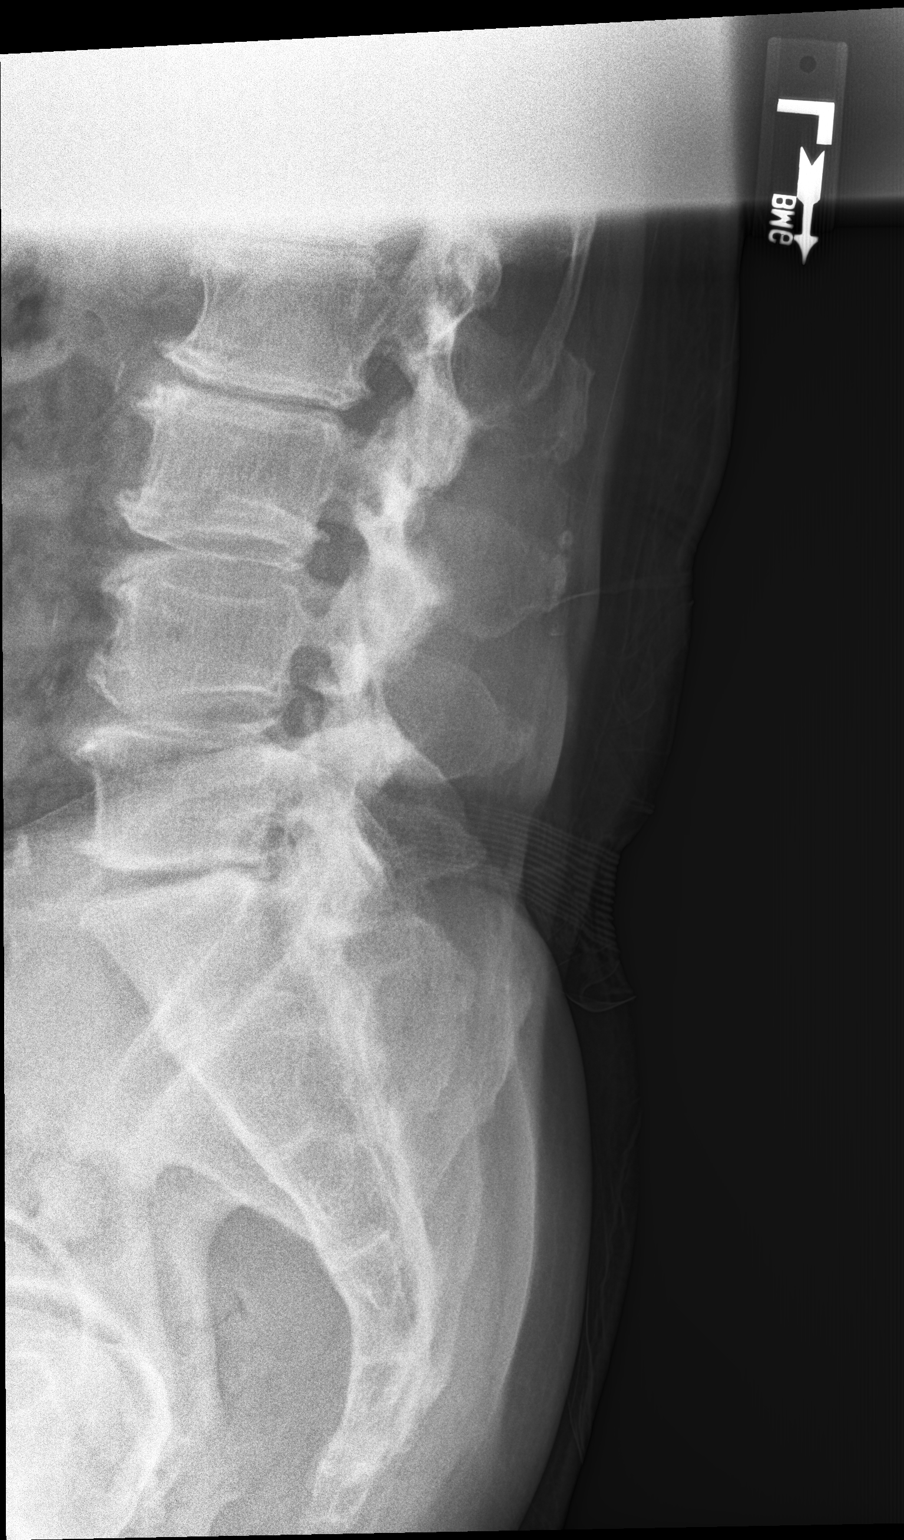

[3 of 3 positions shown; findings below may reference images not displayed]

FINDINGS: Normal lumbar segmentation. Stable straightening and mild reversal
of lumbar lordosis since 9823. Chronic widespread disc space loss,
vacuum disc, endplate spurring. Mild to moderate chronic lower
lumbar facet hypertrophy. Underlying bone mineralization within
normal limits. Visible sacrum and SI joints appear grossly intact.
No acute osseous abnormality identified. Negative abdominal and
pelvic visceral contours.
IMPRESSION: 1. No acute osseous abnormality identified in the lumbar spine.
2. Diffuse chronic lumbar disc and endplate degeneration.

## 2021-08-15 IMAGING — DX DG HIP (WITH OR WITHOUT PELVIS) 2-3V*R*
3 series · 3 of 3 positions shown · non-contrast
Comparison: CT Abdomen 12/09/2016.

CLINICAL DATA: 79-year-old male with posterior right hip pain. No
known injury.

EXAM:
DG HIP (WITH OR WITHOUT PELVIS) 2-3V RIGHT

[pelvis ap]
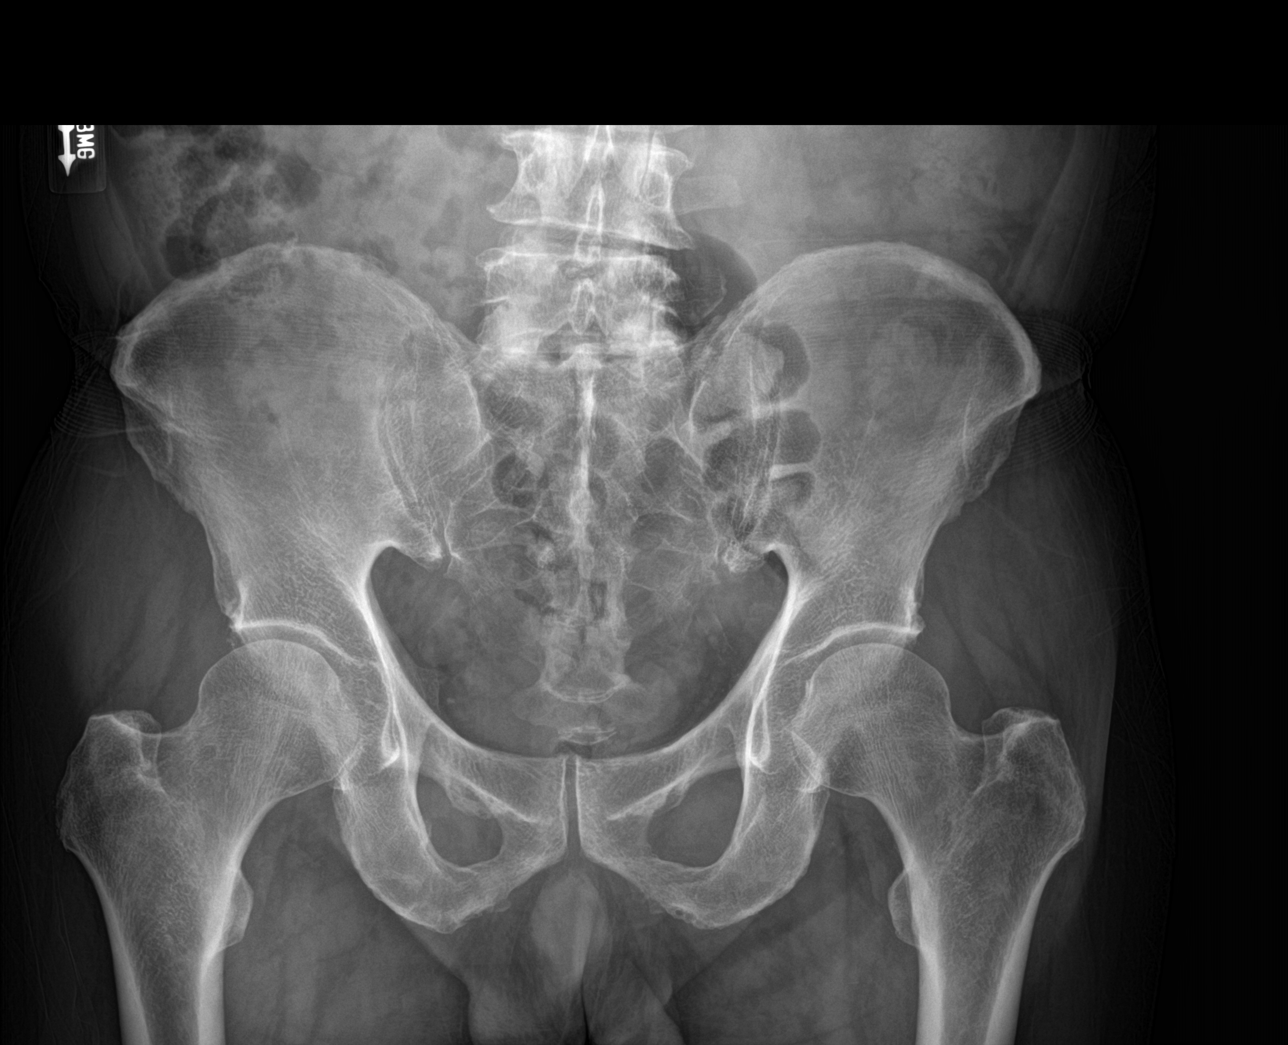

[hip ap]
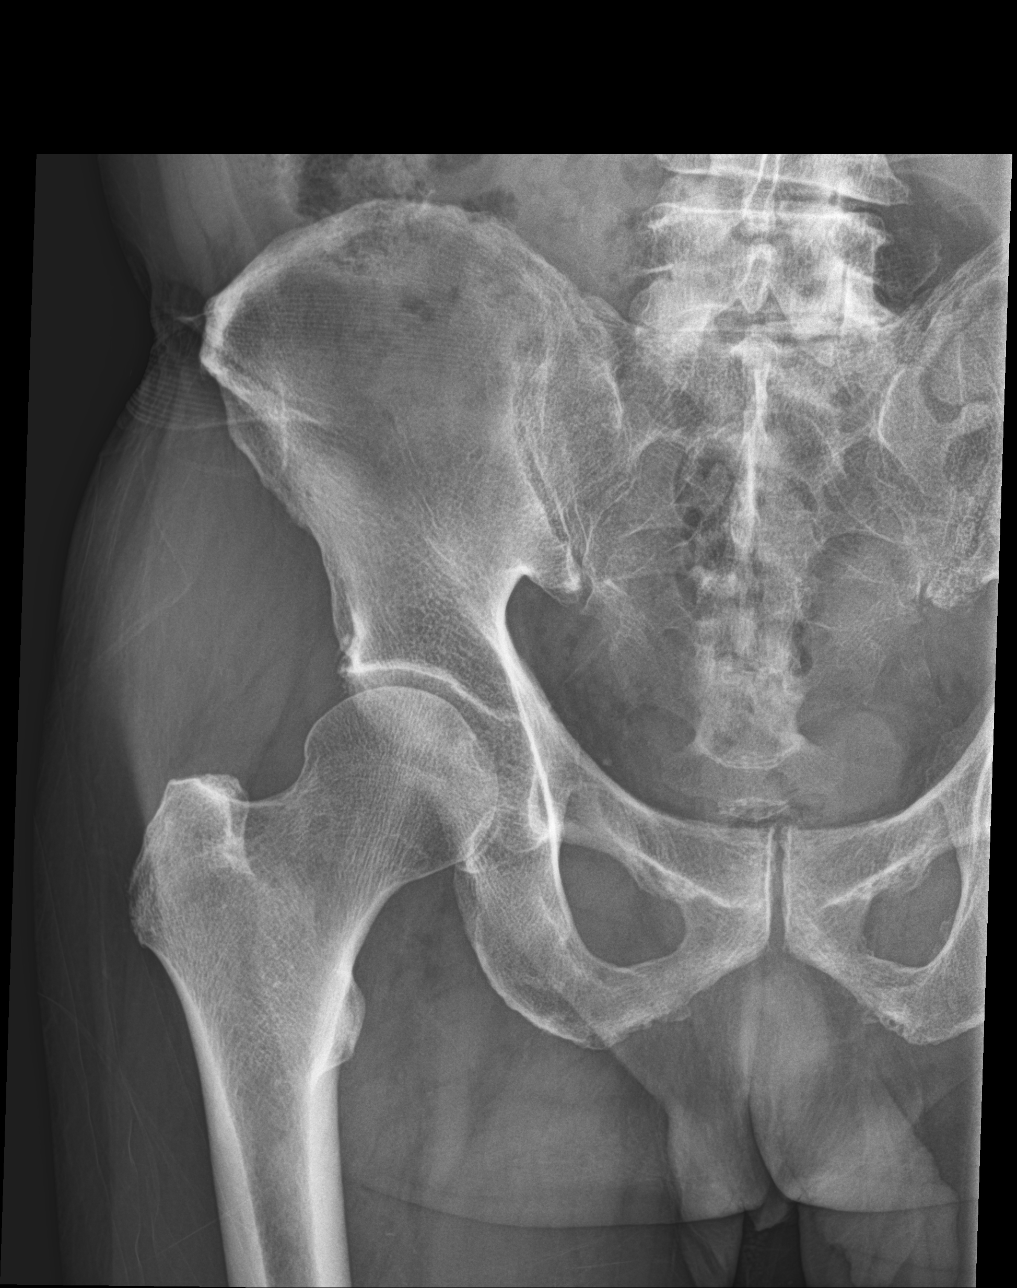

[hip lat]
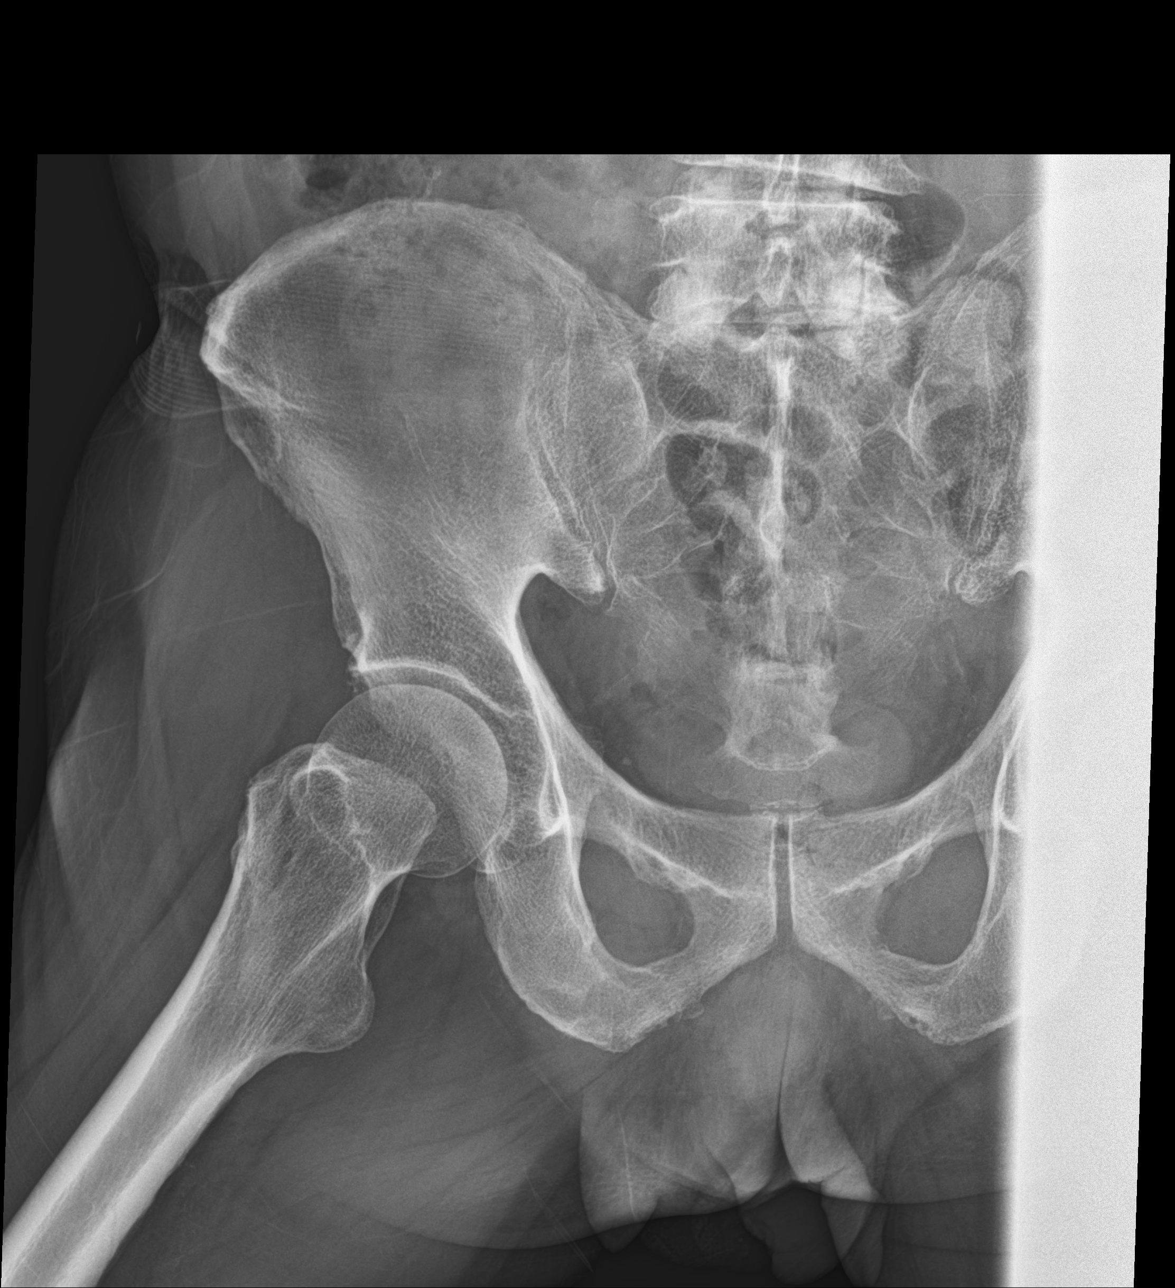

[3 of 3 positions shown; findings below may reference images not displayed]

FINDINGS: Femoral heads normally located. Hip joint spaces appear stable since
1127 and within normal limits. Pelvis appears stable and intact. SI
joints within normal limits. Partially visible chronic lumbar disc
and endplate degeneration. Negative visible lower abdominal and
pelvic visceral contours. Grossly intact proximal left femur.
Proximal right femur appears intact. Bone mineralization is within
normal limits. No acute osseous abnormality identified.
IMPRESSION: 1. Negative for age radiographic appearance of the right hip and
pelvis.
2. Chronic lumbar disc and endplate degeneration.

## 2021-10-21 DIAGNOSIS — N1831 Chronic kidney disease, stage 3a: Secondary | ICD-10-CM | POA: Diagnosis not present

## 2021-10-21 DIAGNOSIS — E1122 Type 2 diabetes mellitus with diabetic chronic kidney disease: Secondary | ICD-10-CM | POA: Diagnosis not present

## 2021-10-21 DIAGNOSIS — M954 Acquired deformity of chest and rib: Secondary | ICD-10-CM | POA: Diagnosis not present

## 2021-10-21 DIAGNOSIS — S99921A Unspecified injury of right foot, initial encounter: Secondary | ICD-10-CM | POA: Diagnosis not present

## 2021-10-21 DIAGNOSIS — M25872 Other specified joint disorders, left ankle and foot: Secondary | ICD-10-CM | POA: Diagnosis not present

## 2021-10-21 DIAGNOSIS — M7989 Other specified soft tissue disorders: Secondary | ICD-10-CM | POA: Diagnosis not present

## 2021-10-21 DIAGNOSIS — R0781 Pleurodynia: Secondary | ICD-10-CM | POA: Diagnosis not present

## 2021-10-21 DIAGNOSIS — W19XXXA Unspecified fall, initial encounter: Secondary | ICD-10-CM | POA: Diagnosis not present

## 2021-10-21 DIAGNOSIS — L089 Local infection of the skin and subcutaneous tissue, unspecified: Secondary | ICD-10-CM | POA: Diagnosis not present

## 2021-11-18 DIAGNOSIS — H11001 Unspecified pterygium of right eye: Secondary | ICD-10-CM | POA: Diagnosis not present

## 2021-11-18 DIAGNOSIS — H25811 Combined forms of age-related cataract, right eye: Secondary | ICD-10-CM | POA: Diagnosis not present

## 2021-12-02 DIAGNOSIS — H11001 Unspecified pterygium of right eye: Secondary | ICD-10-CM | POA: Diagnosis not present

## 2021-12-02 DIAGNOSIS — H2511 Age-related nuclear cataract, right eye: Secondary | ICD-10-CM | POA: Diagnosis not present

## 2021-12-02 DIAGNOSIS — H269 Unspecified cataract: Secondary | ICD-10-CM | POA: Diagnosis not present

## 2021-12-10 DIAGNOSIS — R600 Localized edema: Secondary | ICD-10-CM | POA: Diagnosis not present

## 2021-12-10 DIAGNOSIS — M25872 Other specified joint disorders, left ankle and foot: Secondary | ICD-10-CM | POA: Diagnosis not present

## 2021-12-10 DIAGNOSIS — M19072 Primary osteoarthritis, left ankle and foot: Secondary | ICD-10-CM | POA: Diagnosis not present

## 2021-12-16 DIAGNOSIS — H2512 Age-related nuclear cataract, left eye: Secondary | ICD-10-CM | POA: Diagnosis not present

## 2021-12-16 DIAGNOSIS — H269 Unspecified cataract: Secondary | ICD-10-CM | POA: Diagnosis not present

## 2022-01-10 ENCOUNTER — Other Ambulatory Visit: Payer: Self-pay | Admitting: Family Medicine

## 2022-01-10 DIAGNOSIS — E1165 Type 2 diabetes mellitus with hyperglycemia: Secondary | ICD-10-CM

## 2022-01-26 ENCOUNTER — Other Ambulatory Visit: Payer: Self-pay | Admitting: Family Medicine

## 2022-01-26 DIAGNOSIS — E1165 Type 2 diabetes mellitus with hyperglycemia: Secondary | ICD-10-CM

## 2022-05-06 DIAGNOSIS — L853 Xerosis cutis: Secondary | ICD-10-CM | POA: Diagnosis not present

## 2022-05-06 DIAGNOSIS — N1831 Chronic kidney disease, stage 3a: Secondary | ICD-10-CM | POA: Diagnosis not present

## 2022-05-06 DIAGNOSIS — E1122 Type 2 diabetes mellitus with diabetic chronic kidney disease: Secondary | ICD-10-CM | POA: Diagnosis not present

## 2022-05-06 DIAGNOSIS — E7800 Pure hypercholesterolemia, unspecified: Secondary | ICD-10-CM | POA: Diagnosis not present

## 2022-05-06 DIAGNOSIS — M2550 Pain in unspecified joint: Secondary | ICD-10-CM | POA: Diagnosis not present

## 2022-05-06 DIAGNOSIS — I129 Hypertensive chronic kidney disease with stage 1 through stage 4 chronic kidney disease, or unspecified chronic kidney disease: Secondary | ICD-10-CM | POA: Diagnosis not present

## 2022-05-06 DIAGNOSIS — Z0000 Encounter for general adult medical examination without abnormal findings: Secondary | ICD-10-CM | POA: Diagnosis not present

## 2022-05-12 DIAGNOSIS — Z0000 Encounter for general adult medical examination without abnormal findings: Secondary | ICD-10-CM | POA: Diagnosis not present

## 2022-05-12 DIAGNOSIS — E1122 Type 2 diabetes mellitus with diabetic chronic kidney disease: Secondary | ICD-10-CM | POA: Diagnosis not present

## 2022-05-12 DIAGNOSIS — N1831 Chronic kidney disease, stage 3a: Secondary | ICD-10-CM | POA: Diagnosis not present

## 2022-08-12 DIAGNOSIS — E1122 Type 2 diabetes mellitus with diabetic chronic kidney disease: Secondary | ICD-10-CM | POA: Diagnosis not present

## 2022-08-12 DIAGNOSIS — N1831 Chronic kidney disease, stage 3a: Secondary | ICD-10-CM | POA: Diagnosis not present

## 2022-08-12 DIAGNOSIS — E7800 Pure hypercholesterolemia, unspecified: Secondary | ICD-10-CM | POA: Diagnosis not present

## 2022-08-12 DIAGNOSIS — I10 Essential (primary) hypertension: Secondary | ICD-10-CM | POA: Diagnosis not present

## 2022-12-16 DIAGNOSIS — E1122 Type 2 diabetes mellitus with diabetic chronic kidney disease: Secondary | ICD-10-CM | POA: Diagnosis not present

## 2022-12-16 DIAGNOSIS — I129 Hypertensive chronic kidney disease with stage 1 through stage 4 chronic kidney disease, or unspecified chronic kidney disease: Secondary | ICD-10-CM | POA: Diagnosis not present

## 2022-12-16 DIAGNOSIS — M79675 Pain in left toe(s): Secondary | ICD-10-CM | POA: Diagnosis not present

## 2022-12-16 DIAGNOSIS — N1831 Chronic kidney disease, stage 3a: Secondary | ICD-10-CM | POA: Diagnosis not present

## 2022-12-16 DIAGNOSIS — L853 Xerosis cutis: Secondary | ICD-10-CM | POA: Diagnosis not present

## 2022-12-16 DIAGNOSIS — R2242 Localized swelling, mass and lump, left lower limb: Secondary | ICD-10-CM | POA: Diagnosis not present

## 2022-12-16 DIAGNOSIS — E7800 Pure hypercholesterolemia, unspecified: Secondary | ICD-10-CM | POA: Diagnosis not present

## 2022-12-16 DIAGNOSIS — M1A09X0 Idiopathic chronic gout, multiple sites, without tophus (tophi): Secondary | ICD-10-CM | POA: Diagnosis not present

## 2022-12-30 DIAGNOSIS — I739 Peripheral vascular disease, unspecified: Secondary | ICD-10-CM | POA: Diagnosis not present

## 2022-12-30 DIAGNOSIS — M1A0721 Idiopathic chronic gout, left ankle and foot, with tophus (tophi): Secondary | ICD-10-CM | POA: Diagnosis not present

## 2022-12-30 DIAGNOSIS — M86672 Other chronic osteomyelitis, left ankle and foot: Secondary | ICD-10-CM | POA: Diagnosis not present

## 2022-12-30 DIAGNOSIS — M19072 Primary osteoarthritis, left ankle and foot: Secondary | ICD-10-CM | POA: Diagnosis not present

## 2022-12-30 DIAGNOSIS — M79672 Pain in left foot: Secondary | ICD-10-CM | POA: Diagnosis not present

## 2022-12-30 DIAGNOSIS — M2012 Hallux valgus (acquired), left foot: Secondary | ICD-10-CM | POA: Diagnosis not present

## 2022-12-30 DIAGNOSIS — M7989 Other specified soft tissue disorders: Secondary | ICD-10-CM | POA: Diagnosis not present

## 2023-04-26 DIAGNOSIS — M25532 Pain in left wrist: Secondary | ICD-10-CM | POA: Diagnosis not present

## 2023-05-12 DIAGNOSIS — E1122 Type 2 diabetes mellitus with diabetic chronic kidney disease: Secondary | ICD-10-CM | POA: Diagnosis not present

## 2023-05-12 DIAGNOSIS — Z0000 Encounter for general adult medical examination without abnormal findings: Secondary | ICD-10-CM | POA: Diagnosis not present

## 2023-05-12 DIAGNOSIS — N1831 Chronic kidney disease, stage 3a: Secondary | ICD-10-CM | POA: Diagnosis not present

## 2023-05-12 DIAGNOSIS — I129 Hypertensive chronic kidney disease with stage 1 through stage 4 chronic kidney disease, or unspecified chronic kidney disease: Secondary | ICD-10-CM | POA: Diagnosis not present

## 2023-05-12 DIAGNOSIS — E7800 Pure hypercholesterolemia, unspecified: Secondary | ICD-10-CM | POA: Diagnosis not present

## 2023-05-18 NOTE — Progress Notes (Signed)
   05/18/2023  Patient ID: Reginald Long, male   DOB: 1941-01-18, 83 y.o.   MRN: [id number]  Contacted patient regarding medication adherence from a quality report for [facility]. The patient failed MAD in 2024.     Per [facility] and payor portal fill history: Metformin 500 mg - last filled 04/17/23 for a 90-day supply.  Atorvastatin 10 mg - last filled 05/12/23 for a 90-day supply. PDC currently 100% for MAC measure. Lisinopril 5 mg - last filled 05/15/23 for a 30-day supply.   I will follow up for adherence monitoring.   Thank you for allowing pharmacy to be a part of this patient's care.    Livia Riffle, PharmD Clinical Pharmacist  [phone]

## 2023-09-15 DIAGNOSIS — N1831 Chronic kidney disease, stage 3a: Secondary | ICD-10-CM | POA: Diagnosis not present

## 2023-09-15 DIAGNOSIS — L299 Pruritus, unspecified: Secondary | ICD-10-CM | POA: Diagnosis not present

## 2023-09-15 DIAGNOSIS — E7800 Pure hypercholesterolemia, unspecified: Secondary | ICD-10-CM | POA: Diagnosis not present

## 2023-09-15 DIAGNOSIS — E1122 Type 2 diabetes mellitus with diabetic chronic kidney disease: Secondary | ICD-10-CM | POA: Diagnosis not present

## 2023-09-15 DIAGNOSIS — G8929 Other chronic pain: Secondary | ICD-10-CM | POA: Diagnosis not present

## 2023-09-15 DIAGNOSIS — I129 Hypertensive chronic kidney disease with stage 1 through stage 4 chronic kidney disease, or unspecified chronic kidney disease: Secondary | ICD-10-CM | POA: Diagnosis not present

## 2023-09-15 DIAGNOSIS — M5136 Other intervertebral disc degeneration, lumbar region with discogenic back pain only: Secondary | ICD-10-CM | POA: Diagnosis not present

## 2023-10-13 DIAGNOSIS — I10 Essential (primary) hypertension: Secondary | ICD-10-CM | POA: Diagnosis not present
